# Patient Record
Sex: Male | Born: 1950 | Race: Black or African American | Hispanic: No | Smoking: Former smoker
Health system: Southern US, Community
[De-identification: ages and names within clinical notes are randomized; demographics above are authoritative.]

## PROBLEM LIST (undated history)

## (undated) DIAGNOSIS — G709 Myoneural disorder, unspecified: Secondary | ICD-10-CM

## (undated) DIAGNOSIS — Z86718 Personal history of other venous thrombosis and embolism: Secondary | ICD-10-CM

## (undated) DIAGNOSIS — F329 Major depressive disorder, single episode, unspecified: Secondary | ICD-10-CM

## (undated) DIAGNOSIS — D649 Anemia, unspecified: Secondary | ICD-10-CM

## (undated) DIAGNOSIS — F32A Depression, unspecified: Secondary | ICD-10-CM

## (undated) DIAGNOSIS — I509 Heart failure, unspecified: Secondary | ICD-10-CM

## (undated) DIAGNOSIS — M109 Gout, unspecified: Secondary | ICD-10-CM

## (undated) DIAGNOSIS — M1990 Unspecified osteoarthritis, unspecified site: Secondary | ICD-10-CM

## (undated) DIAGNOSIS — E785 Hyperlipidemia, unspecified: Secondary | ICD-10-CM

## (undated) DIAGNOSIS — I739 Peripheral vascular disease, unspecified: Secondary | ICD-10-CM

## (undated) DIAGNOSIS — IMO0001 Reserved for inherently not codable concepts without codable children: Secondary | ICD-10-CM

## (undated) DIAGNOSIS — T8859XA Other complications of anesthesia, initial encounter: Secondary | ICD-10-CM

## (undated) DIAGNOSIS — T4145XA Adverse effect of unspecified anesthetic, initial encounter: Secondary | ICD-10-CM

## (undated) DIAGNOSIS — N189 Chronic kidney disease, unspecified: Secondary | ICD-10-CM

## (undated) DIAGNOSIS — G8929 Other chronic pain: Secondary | ICD-10-CM

## (undated) DIAGNOSIS — Z8619 Personal history of other infectious and parasitic diseases: Secondary | ICD-10-CM

## (undated) DIAGNOSIS — I8390 Asymptomatic varicose veins of unspecified lower extremity: Secondary | ICD-10-CM

## (undated) DIAGNOSIS — Z87898 Personal history of other specified conditions: Secondary | ICD-10-CM

## (undated) DIAGNOSIS — I10 Essential (primary) hypertension: Secondary | ICD-10-CM

## (undated) HISTORY — DX: Asymptomatic varicose veins of unspecified lower extremity: I83.90

## (undated) HISTORY — PX: VARICOSE VEIN SURGERY: SHX832

## (undated) HISTORY — DX: Essential (primary) hypertension: I10

## (undated) HISTORY — PX: HEMORRHOID SURGERY: SHX153

## (undated) HISTORY — DX: Hyperlipidemia, unspecified: E78.5

## (undated) HISTORY — DX: Major depressive disorder, single episode, unspecified: F32.9

## (undated) HISTORY — DX: Personal history of other specified conditions: Z87.898

## (undated) HISTORY — PX: VASCULAR SURGERY: SHX849

## (undated) HISTORY — DX: Depression, unspecified: F32.A

## (undated) HISTORY — DX: Personal history of other infectious and parasitic diseases: Z86.19

## (undated) HISTORY — DX: Personal history of other venous thrombosis and embolism: Z86.718

---

## 2008-06-05 LAB — HM COLONOSCOPY

## 2011-06-26 DIAGNOSIS — M5126 Other intervertebral disc displacement, lumbar region: Secondary | ICD-10-CM | POA: Diagnosis not present

## 2011-06-26 DIAGNOSIS — M1A00X0 Idiopathic chronic gout, unspecified site, without tophus (tophi): Secondary | ICD-10-CM | POA: Diagnosis not present

## 2011-06-26 DIAGNOSIS — M545 Low back pain: Secondary | ICD-10-CM | POA: Diagnosis not present

## 2011-06-26 DIAGNOSIS — IMO0002 Reserved for concepts with insufficient information to code with codable children: Secondary | ICD-10-CM | POA: Diagnosis not present

## 2011-07-04 DIAGNOSIS — S99929A Unspecified injury of unspecified foot, initial encounter: Secondary | ICD-10-CM | POA: Diagnosis not present

## 2011-07-04 DIAGNOSIS — S8990XA Unspecified injury of unspecified lower leg, initial encounter: Secondary | ICD-10-CM | POA: Diagnosis not present

## 2011-07-04 DIAGNOSIS — IMO0002 Reserved for concepts with insufficient information to code with codable children: Secondary | ICD-10-CM | POA: Diagnosis not present

## 2011-07-04 DIAGNOSIS — W010XXA Fall on same level from slipping, tripping and stumbling without subsequent striking against object, initial encounter: Secondary | ICD-10-CM | POA: Diagnosis not present

## 2011-07-26 DIAGNOSIS — M25579 Pain in unspecified ankle and joints of unspecified foot: Secondary | ICD-10-CM | POA: Diagnosis not present

## 2011-07-26 DIAGNOSIS — M545 Low back pain: Secondary | ICD-10-CM | POA: Diagnosis not present

## 2011-07-26 DIAGNOSIS — IMO0002 Reserved for concepts with insufficient information to code with codable children: Secondary | ICD-10-CM | POA: Diagnosis not present

## 2011-07-26 DIAGNOSIS — M5126 Other intervertebral disc displacement, lumbar region: Secondary | ICD-10-CM | POA: Diagnosis not present

## 2011-08-23 DIAGNOSIS — M545 Low back pain: Secondary | ICD-10-CM | POA: Diagnosis not present

## 2011-08-23 DIAGNOSIS — IMO0002 Reserved for concepts with insufficient information to code with codable children: Secondary | ICD-10-CM | POA: Diagnosis not present

## 2011-08-23 DIAGNOSIS — M25579 Pain in unspecified ankle and joints of unspecified foot: Secondary | ICD-10-CM | POA: Diagnosis not present

## 2011-08-23 DIAGNOSIS — M5126 Other intervertebral disc displacement, lumbar region: Secondary | ICD-10-CM | POA: Diagnosis not present

## 2011-09-22 DIAGNOSIS — M25579 Pain in unspecified ankle and joints of unspecified foot: Secondary | ICD-10-CM | POA: Diagnosis not present

## 2011-09-22 DIAGNOSIS — M545 Low back pain: Secondary | ICD-10-CM | POA: Diagnosis not present

## 2011-09-22 DIAGNOSIS — M5126 Other intervertebral disc displacement, lumbar region: Secondary | ICD-10-CM | POA: Diagnosis not present

## 2011-09-22 DIAGNOSIS — IMO0002 Reserved for concepts with insufficient information to code with codable children: Secondary | ICD-10-CM | POA: Diagnosis not present

## 2011-10-06 ENCOUNTER — Encounter (HOSPITAL_BASED_OUTPATIENT_CLINIC_OR_DEPARTMENT_OTHER): Payer: Medicare (Managed Care) | Attending: General Surgery

## 2011-10-06 ENCOUNTER — Other Ambulatory Visit (HOSPITAL_BASED_OUTPATIENT_CLINIC_OR_DEPARTMENT_OTHER): Payer: Self-pay | Admitting: General Surgery

## 2011-10-06 ENCOUNTER — Ambulatory Visit (HOSPITAL_COMMUNITY)
Admission: RE | Admit: 2011-10-06 | Discharge: 2011-10-06 | Disposition: A | Discharge status: Home / Self Care | Payer: PRIVATE HEALTH INSURANCE | Source: Ambulatory Visit | Attending: General Surgery | Admitting: General Surgery

## 2011-10-06 DIAGNOSIS — L97309 Non-pressure chronic ulcer of unspecified ankle with unspecified severity: Secondary | ICD-10-CM | POA: Insufficient documentation

## 2011-10-06 DIAGNOSIS — M79673 Pain in unspecified foot: Secondary | ICD-10-CM

## 2011-10-06 DIAGNOSIS — E1169 Type 2 diabetes mellitus with other specified complication: Secondary | ICD-10-CM | POA: Insufficient documentation

## 2011-10-06 DIAGNOSIS — M7989 Other specified soft tissue disorders: Secondary | ICD-10-CM | POA: Insufficient documentation

## 2011-10-06 DIAGNOSIS — L97509 Non-pressure chronic ulcer of other part of unspecified foot with unspecified severity: Secondary | ICD-10-CM | POA: Insufficient documentation

## 2011-10-06 DIAGNOSIS — M25579 Pain in unspecified ankle and joints of unspecified foot: Secondary | ICD-10-CM

## 2011-10-06 DIAGNOSIS — Z79899 Other long term (current) drug therapy: Secondary | ICD-10-CM | POA: Insufficient documentation

## 2011-10-06 DIAGNOSIS — E785 Hyperlipidemia, unspecified: Secondary | ICD-10-CM | POA: Insufficient documentation

## 2011-10-06 DIAGNOSIS — Z86718 Personal history of other venous thrombosis and embolism: Secondary | ICD-10-CM | POA: Insufficient documentation

## 2011-10-18 DIAGNOSIS — E1142 Type 2 diabetes mellitus with diabetic polyneuropathy: Secondary | ICD-10-CM | POA: Diagnosis not present

## 2011-10-18 DIAGNOSIS — M25579 Pain in unspecified ankle and joints of unspecified foot: Secondary | ICD-10-CM | POA: Diagnosis not present

## 2011-10-18 DIAGNOSIS — M5126 Other intervertebral disc displacement, lumbar region: Secondary | ICD-10-CM | POA: Diagnosis not present

## 2011-10-18 DIAGNOSIS — M1A00X0 Idiopathic chronic gout, unspecified site, without tophus (tophi): Secondary | ICD-10-CM | POA: Diagnosis not present

## 2011-11-01 LAB — GLUCOSE, CAPILLARY
Glucose-Capillary: 112 mg/dL — ABNORMAL HIGH (ref 70–99)
Glucose-Capillary: 134 mg/dL — ABNORMAL HIGH (ref 70–99)
Glucose-Capillary: 113 mg/dL — ABNORMAL HIGH (ref 70–99)
Glucose-Capillary: 118 mg/dL — ABNORMAL HIGH (ref 70–99)
Glucose-Capillary: 119 mg/dL — ABNORMAL HIGH (ref 70–99)
Glucose-Capillary: 120 mg/dL — ABNORMAL HIGH (ref 70–99)
Glucose-Capillary: 123 mg/dL — ABNORMAL HIGH (ref 70–99)

## 2011-11-02 LAB — GLUCOSE, CAPILLARY: Glucose-Capillary: 95 mg/dL (ref 70–99)

## 2011-11-06 ENCOUNTER — Encounter (HOSPITAL_BASED_OUTPATIENT_CLINIC_OR_DEPARTMENT_OTHER): Payer: Medicare (Managed Care) | Attending: General Surgery

## 2011-11-06 DIAGNOSIS — E1169 Type 2 diabetes mellitus with other specified complication: Secondary | ICD-10-CM | POA: Insufficient documentation

## 2011-11-06 DIAGNOSIS — L97509 Non-pressure chronic ulcer of other part of unspecified foot with unspecified severity: Secondary | ICD-10-CM | POA: Insufficient documentation

## 2011-11-06 DIAGNOSIS — L97309 Non-pressure chronic ulcer of unspecified ankle with unspecified severity: Secondary | ICD-10-CM | POA: Insufficient documentation

## 2011-11-06 DIAGNOSIS — I872 Venous insufficiency (chronic) (peripheral): Secondary | ICD-10-CM | POA: Insufficient documentation

## 2011-11-06 LAB — GLUCOSE, CAPILLARY: Glucose-Capillary: 133 mg/dL — ABNORMAL HIGH (ref 70–99)

## 2011-11-07 ENCOUNTER — Ambulatory Visit (INDEPENDENT_AMBULATORY_CARE_PROVIDER_SITE_OTHER): Payer: PRIVATE HEALTH INSURANCE | Admitting: Vascular Surgery

## 2011-11-07 DIAGNOSIS — I739 Peripheral vascular disease, unspecified: Secondary | ICD-10-CM

## 2011-11-07 DIAGNOSIS — L97909 Non-pressure chronic ulcer of unspecified part of unspecified lower leg with unspecified severity: Secondary | ICD-10-CM

## 2011-11-07 LAB — GLUCOSE, CAPILLARY: Glucose-Capillary: 145 mg/dL — ABNORMAL HIGH (ref 70–99)

## 2011-11-09 LAB — GLUCOSE, CAPILLARY
Glucose-Capillary: 126 mg/dL — ABNORMAL HIGH (ref 70–99)
Glucose-Capillary: 126 mg/dL — ABNORMAL HIGH (ref 70–99)

## 2011-11-10 ENCOUNTER — Encounter (HOSPITAL_BASED_OUTPATIENT_CLINIC_OR_DEPARTMENT_OTHER): Payer: PRIVATE HEALTH INSURANCE

## 2011-11-10 LAB — GLUCOSE, CAPILLARY
Glucose-Capillary: 138 mg/dL — ABNORMAL HIGH (ref 70–99)
Glucose-Capillary: 125 mg/dL — ABNORMAL HIGH (ref 70–99)

## 2011-11-14 LAB — GLUCOSE, CAPILLARY: Glucose-Capillary: 156 mg/dL — ABNORMAL HIGH (ref 70–99)

## 2011-11-15 ENCOUNTER — Encounter: Payer: Self-pay | Admitting: Vascular Surgery

## 2011-11-16 ENCOUNTER — Ambulatory Visit (INDEPENDENT_AMBULATORY_CARE_PROVIDER_SITE_OTHER): Payer: PRIVATE HEALTH INSURANCE | Admitting: Vascular Surgery

## 2011-11-16 ENCOUNTER — Encounter: Payer: Self-pay | Admitting: Vascular Surgery

## 2011-11-16 ENCOUNTER — Encounter (HOSPITAL_COMMUNITY): Payer: Self-pay | Admitting: Pharmacy Technician

## 2011-11-16 ENCOUNTER — Other Ambulatory Visit: Payer: Self-pay

## 2011-11-16 VITALS — BP 138/81 | HR 59 | Resp 18 | Ht 73.0 in | Wt 220.0 lb

## 2011-11-16 DIAGNOSIS — I999 Unspecified disorder of circulatory system: Secondary | ICD-10-CM

## 2011-11-16 DIAGNOSIS — I998 Other disorder of circulatory system: Secondary | ICD-10-CM

## 2011-11-16 MED ORDER — SODIUM CHLORIDE 0.9 % IV SOLN: INTRAVENOUS | Status: DC

## 2011-11-16 NOTE — Progress Notes (Signed)
The patient presents today for evaluation of a lower surety arterial insufficiency. He is a 61-year-old black male with a several month history of ulcerations over the right foot over his lateral ankle and lateral foot. He has been seen at the wound center and has been treated with hyperbaric treatment. He has severe pain and has not had any improvement of this. We are seeing him today for further evaluation. 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 The study was from 11/07/2011. The patient does have a long history of claudication type symptoms on the right. His past history is also significant for prior venous disease. He has scars and skin changes to suggest a prior Linton procedure on his left leg. He does have severe venous stasis changes.  Past Medical History  Diagnosis Date  . Vein, varicose   . Hypertension   . Hyperlipidemia   . Varicose veins   . Diabetes mellitus     History  Substance Use Topics  . Smoking status: Former Smoker -- 35 years    Types: Cigarettes    Quit date: 11/12/2011  . Smokeless tobacco: Never Used  . Alcohol Use: Yes    Family History  Problem Relation Age of Onset  . Cancer Mother   . Hyperlipidemia Father   . Hypertension Father   . Diabetes Father     No Known Allergies  Current outpatient prescriptions:cephALEXin (KEFLEX) 500 MG capsule, Take 500 mg by mouth 4 (four) times daily., Disp: , Rfl: ;  colchicine 0.6 MG tablet, Take 0.6 mg by mouth daily., Disp: , Rfl: ;  gabapentin (NEURONTIN) 800 MG tablet, Take 800 mg by mouth 2 (two) times daily with a meal., Disp: , Rfl: ;  lisinopril (PRINIVIL,ZESTRIL) 20 MG tablet, Take 20 mg by mouth daily., Disp: , Rfl:  metFORMIN (GLUCOPHAGE) 500 MG tablet, Take 500 mg by mouth 2 (two) times daily  with a meal., Disp: , Rfl: ;  oxyCODONE (OXYCONTIN) 10 MG 12 hr tablet, Take 10 mg by mouth every 12 (twelve) hours., Disp: , Rfl: ;  pravastatin (PRAVACHOL) 20 MG tablet, Take 20 mg by mouth daily., Disp: , Rfl: ;  pregabalin (LYRICA) 300 MG capsule, Take 300 mg by mouth 2 (two) times daily., Disp: , Rfl:   BP 138/81  Pulse 59  Resp 18  Ht 6' 1" (1.854 m)  Wt 220 lb (99.791 kg)  BMI 29.03 kg/m2  Body mass index is 29.03 kg/(m^2).       Review of systems positive for dizziness and the skin ulcer or rashes, history of venous varicosities otherwise negative except as in his past medical  Physical exam well-developed well-nourished black male in no acute distress. HEENT is normal. Heart regular rate and rhythm without murmur. Chest clear bilaterally without rales rhonchi or wheezes. Abdomen soft nontender no masses no aneurysm palpable Neurologically he is grossly intact Pulse status: Carotid arteries without bruits, 2+ radial and 2+ femoral pulses bilaterally. 2+ left dorsalis pedis pulse. Absent right popliteal and distal pulses. 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 I discussed this at length with the patient. I do not feel he is   adequate flow for healing. I have recommended that we proceed with arteriography for further evaluation. I explained by physical exam in all likelihood he would have superficial femoral occlusive disease and a may benefit from consideration of femoral-popliteal bypass. He understands that this is a limb threatening situation. We have scheduled his arteriography for tomorrow 614 at [facility]  

## 2011-11-17 ENCOUNTER — Ambulatory Visit (HOSPITAL_COMMUNITY)
Admission: RE | Admit: 2011-11-17 | Discharge: 2011-11-17 | Disposition: A | Discharge status: Home / Self Care | Payer: Medicare (Managed Care) | Source: Ambulatory Visit | Attending: Vascular Surgery | Admitting: Vascular Surgery

## 2011-11-17 ENCOUNTER — Encounter (HOSPITAL_COMMUNITY)
Admission: RE | Disposition: A | Discharge status: Home / Self Care | Payer: Self-pay | Source: Ambulatory Visit | Attending: Vascular Surgery

## 2011-11-17 ENCOUNTER — Other Ambulatory Visit: Payer: Self-pay

## 2011-11-17 DIAGNOSIS — I739 Peripheral vascular disease, unspecified: Secondary | ICD-10-CM

## 2011-11-17 DIAGNOSIS — E785 Hyperlipidemia, unspecified: Secondary | ICD-10-CM | POA: Insufficient documentation

## 2011-11-17 DIAGNOSIS — L98499 Non-pressure chronic ulcer of skin of other sites with unspecified severity: Secondary | ICD-10-CM

## 2011-11-17 DIAGNOSIS — I10 Essential (primary) hypertension: Secondary | ICD-10-CM | POA: Insufficient documentation

## 2011-11-17 DIAGNOSIS — E119 Type 2 diabetes mellitus without complications: Secondary | ICD-10-CM | POA: Insufficient documentation

## 2011-11-17 HISTORY — PX: ABDOMINAL AORTAGRAM: SHX5454

## 2011-11-17 LAB — POCT I-STAT, CHEM 8
BUN: 19 mg/dL (ref 6–23)
Creatinine, Ser: 1 mg/dL (ref 0.50–1.35)
Glucose, Bld: 95 mg/dL (ref 70–99)
Potassium: 4.8 mEq/L (ref 3.5–5.1)
Sodium: 143 mEq/L (ref 135–145)
TCO2: 29 mmol/L (ref 0–100)

## 2011-11-17 LAB — GLUCOSE, CAPILLARY
Glucose-Capillary: 67 mg/dL — ABNORMAL LOW (ref 70–99)
Glucose-Capillary: 70 mg/dL (ref 70–99)

## 2011-11-17 SURGERY — ABDOMINAL AORTAGRAM
Anesthesia: LOCAL

## 2011-11-17 MED ORDER — LABETALOL HCL 5 MG/ML IV SOLN: 10.0000 mg | INTRAVENOUS | Status: DC | PRN

## 2011-11-17 MED ORDER — ONDANSETRON HCL 4 MG/2ML IJ SOLN: 4.0000 mg | Freq: Four times a day (QID) | INTRAMUSCULAR | Status: DC | PRN

## 2011-11-17 MED ORDER — OXYCODONE-ACETAMINOPHEN 5-325 MG PO TABS
ORAL_TABLET | ORAL | Status: AC
  Filled 2011-11-17: qty 2

## 2011-11-17 MED ORDER — LIDOCAINE HCL (PF) 1 % IJ SOLN
INTRAMUSCULAR | Status: AC
  Filled 2011-11-17: qty 30

## 2011-11-17 MED ORDER — SODIUM CHLORIDE 0.9 % IV SOLN: 1.0000 mL/kg/h | INTRAVENOUS | Status: DC

## 2011-11-17 MED ORDER — MAGNESIUM SULFATE 40 MG/ML IJ SOLN: 2.0000 g | Freq: Once | INTRAMUSCULAR | Status: DC | PRN

## 2011-11-17 MED ORDER — OXYCODONE-ACETAMINOPHEN 5-325 MG PO TABS
ORAL_TABLET | ORAL | Status: AC
  Administered 2011-11-17: 2 via ORAL
  Filled 2011-11-17: qty 2

## 2011-11-17 MED ORDER — HEPARIN (PORCINE) IN NACL 2-0.9 UNIT/ML-% IJ SOLN
INTRAMUSCULAR | Status: AC
  Filled 2011-11-17: qty 1000

## 2011-11-17 MED ORDER — OXYCODONE-ACETAMINOPHEN 5-325 MG PO TABS
1.0000 | ORAL_TABLET | ORAL | Status: DC | PRN
  Administered 2011-11-17 (×3): 2 via ORAL
  Filled 2011-11-17: qty 2

## 2011-11-17 MED ORDER — MIDAZOLAM HCL 2 MG/2ML IJ SOLN
INTRAMUSCULAR | Status: AC
  Filled 2011-11-17: qty 2

## 2011-11-17 MED ORDER — FENTANYL CITRATE 0.05 MG/ML IJ SOLN
INTRAMUSCULAR | Status: AC
  Filled 2011-11-17: qty 2

## 2011-11-17 MED ORDER — HYDRALAZINE HCL 20 MG/ML IJ SOLN
INTRAMUSCULAR | Status: AC
  Filled 2011-11-17: qty 1

## 2011-11-17 MED ORDER — OXYCODONE-ACETAMINOPHEN 5-325 MG PO TABS: 1.0000 | ORAL_TABLET | ORAL | Status: DC | PRN

## 2011-11-17 MED ORDER — HYDRALAZINE HCL 20 MG/ML IJ SOLN
10.0000 mg | INTRAMUSCULAR | Status: DC | PRN
  Administered 2011-11-17: 10 mg via INTRAVENOUS

## 2011-11-17 NOTE — Progress Notes (Signed)
IV team unable to get access.   Dr. Arbie Cookey notified.  IV team to attempt with U/S.

## 2011-11-17 NOTE — Progress Notes (Signed)
Unable to obtain IV access.  IV team called.  Pt states he has no ride home and nobody to stay with him tonight.  States there is nobody he can call so he plans to drive home.  Explained that he cannot drive and he had to have a driver.  Pt states there is not anyone he could call.  Explained to pt that I needed to call Dr. Arbie Cookey and let him know.  Pt states "ok".  Dr. Arbie Cookey notified and came over to speak with pt and reschedule his procedure for when he had a driver and someone to stay with him.  Pt states "Oh that's not a problem, I can get my sister to pick me up."  When Dr. Arbie Cookey left the room, pt states he cannot call her now because she was at work.  Dr. Arbie Cookey aware.

## 2011-11-17 NOTE — H&P (View-Only) (Signed)
The patient presents today for evaluation of a lower surety arterial insufficiency. He is a 61-year-old black male with a several month history of ulcerations over the right foot over his lateral ankle and lateral foot. He has been seen at the wound center and has been treated with hyperbaric treatment. He has severe pain and has not had any improvement of this. We are seeing him today for further evaluation. 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 The study was from 11/07/2011. The patient does have a long history of claudication type symptoms on the right. His past history is also significant for prior venous disease. He has scars and skin changes to suggest a prior Linton procedure on his left leg. He does have severe venous stasis changes.  Past Medical History  Diagnosis Date  . Vein, varicose   . Hypertension   . Hyperlipidemia   . Varicose veins   . Diabetes mellitus     History  Substance Use Topics  . Smoking status: Former Smoker -- 35 years    Types: Cigarettes    Quit date: 11/12/2011  . Smokeless tobacco: Never Used  . Alcohol Use: Yes    Family History  Problem Relation Age of Onset  . Cancer Mother   . Hyperlipidemia Father   . Hypertension Father   . Diabetes Father     No Known Allergies  Current outpatient prescriptions:cephALEXin (KEFLEX) 500 MG capsule, Take 500 mg by mouth 4 (four) times daily., Disp: , Rfl: ;  colchicine 0.6 MG tablet, Take 0.6 mg by mouth daily., Disp: , Rfl: ;  gabapentin (NEURONTIN) 800 MG tablet, Take 800 mg by mouth 2 (two) times daily with a meal., Disp: , Rfl: ;  lisinopril (PRINIVIL,ZESTRIL) 20 MG tablet, Take 20 mg by mouth daily., Disp: , Rfl:  metFORMIN (GLUCOPHAGE) 500 MG tablet, Take 500 mg by mouth 2 (two) times daily  with a meal., Disp: , Rfl: ;  oxyCODONE (OXYCONTIN) 10 MG 12 hr tablet, Take 10 mg by mouth every 12 (twelve) hours., Disp: , Rfl: ;  pravastatin (PRAVACHOL) 20 MG tablet, Take 20 mg by mouth daily., Disp: , Rfl: ;  pregabalin (LYRICA) 300 MG capsule, Take 300 mg by mouth 2 (two) times daily., Disp: , Rfl:   BP 138/81  Pulse 59  Resp 18  Ht 6' 1" (1.854 m)  Wt 220 lb (99.791 kg)  BMI 29.03 kg/m2  Body mass index is 29.03 kg/(m^2).       Review of systems positive for dizziness and the skin ulcer or rashes, history of venous varicosities otherwise negative except as in his past medical  Physical exam well-developed well-nourished black male in no acute distress. HEENT is normal. Heart regular rate and rhythm without murmur. Chest clear bilaterally without rales rhonchi or wheezes. Abdomen soft nontender no masses no aneurysm palpable Neurologically he is grossly intact Pulse status: Carotid arteries without bruits, 2+ radial and 2+ femoral pulses bilaterally. 2+ left dorsalis pedis pulse. Absent right popliteal and distal pulses. 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 I discussed this at length with the patient. I do not feel he is   adequate flow for healing. I have recommended that we proceed with arteriography for further evaluation. I explained by physical exam in all likelihood he would have superficial femoral occlusive disease and a may benefit from consideration of femoral-popliteal bypass. He understands that this is a limb threatening situation. We have scheduled his arteriography for tomorrow 614 at [facility]  

## 2011-11-17 NOTE — Progress Notes (Signed)
Instructed pt to call his sister and ask her to be here at 2100 for D/C teaching.  Pt states it wasn't his sister he was calling but his daughter.  Pt states he called her already and she will be here at 2100.

## 2011-11-17 NOTE — Progress Notes (Signed)
No family present.  PT given our phone to use to call driver.  Pt states they are outside waiting. Pt  taken outside in wheelchair.  Family had just gotten to parking lot.  Family member asked pt why  had waited so late to call them and he stated "I was just going to drive myself but she had to see somebody".  We then waited for Valet parking to bring his car around.  Pt was put into passenger seat.  Another family member was driving the other car home.

## 2011-11-17 NOTE — Discharge Instructions (Signed)
Peripheral Vascular Disease Peripheral Vascular Disease (PVD), also called Peripheral Arterial Disease (PAD), is a circulation problem caused by cholesterol (atherosclerotic plaque) deposits in the arteries. PVD commonly occurs in the lower extremities (legs) but it can occur in other areas of the body, such as your arms. The cholesterol buildup in the arteries reduces blood flow which can cause pain and other serious problems. The presence of PVD can place a person at risk for Coronary Artery Disease (CAD).  CAUSES  Causes of PVD can be many. It is usually associated with more than one risk factor such as:   High Cholesterol.   Smoking.   Diabetes.   Lack of exercise or inactivity.   High blood pressure (hypertension).   Obesity.   Family history.  SYMPTOMS   When the lower extremities are affected, patients with PVD may experience:   Leg pain with exertion or physical activity. This is called INTERMITTENT CLAUDICATION. This may present as cramping or numbness with physical activity. The location of the pain is associated with the level of blockage. For example, blockage at the abdominal level (distal abdominal aorta) may result in buttock or hip pain. Lower leg arterial blockage may result in calf pain.   As PVD becomes more severe, pain can develop with less physical activity.   In people with severe PVD, leg pain may occur at rest.   Other PVD signs and symptoms:   Leg numbness or weakness.   Coldness in the affected leg or foot, especially when compared to the other leg.   A change in leg color.   Patients with significant PVD are more prone to ulcers or sores on toes, feet or legs. These may take longer to heal or may reoccur. The ulcers or sores can become infected.   If signs and symptoms of PVD are ignored, gangrene may occur. This can result in the loss of toes or loss of an entire limb.   Not all leg pain is related to PVD. Other medical conditions can cause leg  pain such as:   Blood clots (embolism) or Deep Vein Thrombosis.   Inflammation of the blood vessels (vasculitis).   Spinal stenosis.  DIAGNOSIS  Diagnosis of PVD can involve several different types of tests. These can include:  Pulse Volume Recording Method (PVR). This test is simple, painless and does not involve the use of X-rays. PVR involves measuring and comparing the blood pressure in the arms and legs. An ABI (Ankle-Brachial Index) is calculated. The normal ratio of blood pressures is 1. As this number becomes smaller, it indicates more severe disease.   < 0.95 - indicates significant narrowing in one or more leg vessels.   <0.8 - there will usually be pain in the foot, leg or buttock with exercise.   <0.4 - will usually have pain in the legs at rest.   <0.25 - usually indicates limb threatening PVD.   Doppler detection of pulses in the legs. This test is painless and checks to see if you have a pulses in your legs/feet.   A dye or contrast material (a substance that highlights the blood vessels so they show up on x-ray) may be given to help your caregiver better see the arteries for the following tests. The dye is eliminated from your body by the kidney's. Your caregiver may order blood work to check your kidney function and other laboratory values before the following tests are performed:   Magnetic Resonance Angiography (MRA). An MRA is a picture   study of the blood vessels and arteries. The MRA machine uses a large magnet to produce images of the blood vessels.   Computed Tomography Angiography (CTA). A CTA is a specialized x-ray that looks at how the blood flows in your blood vessels. An IV may be inserted into your arm so contrast dye can be injected.   Angiogram. Is a procedure that uses x-rays to look at your blood vessels. This procedure is minimally invasive, meaning a small incision (cut) is made in your groin. A small tube (catheter) is then inserted into the artery of  your groin. The catheter is guided to the blood vessel or artery your caregiver wants to examine. Contrast dye is injected into the catheter. X-rays are then taken of the blood vessel or artery. After the images are obtained, the catheter is taken out.  TREATMENT  Treatment of PVD involves many interventions which may include:  Lifestyle changes:   Quitting smoking.   Exercise.   Groin Site Care Refer to this sheet in the next few weeks. These instructions provide you with information on caring for yourself after your procedure. Your caregiver may also give you more specific instructions. Your treatment has been planned according to current medical practices, but problems sometimes occur. Call your caregiver if you have any problems or questions after your procedure. HOME CARE INSTRUCTIONS  You may shower 24 hours after the procedure. Remove the bandage (dressing) and gently wash the site with plain soap and water. Gently pat the site dry.   Do not apply powder or lotion to the site.   Do not sit in a bathtub, swimming pool, or whirlpool for 5 to 7 days.   No bending, squatting, or lifting anything over 10 pounds (4.5 kg) as directed by your caregiver.   Inspect the site at least twice daily.   Do not drive home if you are discharged the same day of the procedure. Have someone else drive you.   You may drive 24 hours after the procedure unless otherwise instructed by your caregiver.  What to expect:  Any bruising will usually fade within 1 to 2 weeks.   Blood that collects in the tissue (hematoma) may be painful to the touch. It should usually decrease in size and tenderness within 1 to 2 weeks.  SEEK IMMEDIATE MEDICAL CARE IF:  You have unusual pain at the groin site or down the affected leg.   You have redness, warmth, swelling, or pain at the groin site.   You have drainage (other than a small amount of blood on the dressing).   You have chills.   You have a fever or  persistent symptoms for more than 72 hours.   You have a fever and your symptoms suddenly get worse.   Your leg becomes pale, cool, tingly, or numb.   You have heavy bleeding from the site. Hold pressure on the site.  Document Released: 06/24/2010 Document Revised: 05/11/2011 Document Reviewed: 06/24/2010 Exit[employer] Patient Information 2012 [employer].  Following a low fat, low cholesterol diet.   Control of diabetes.   Foot care is very important to the PVD patient. Good foot care can help prevent infection.   Medication:   Cholesterol-lowering medicine.   Blood pressure medicine.   Anti-platelet drugs.   Certain medicines may reduce symptoms of Intermittent Claudication.   Interventional/Surgical options:   Angioplasty. An Angioplasty is a procedure that inflates a balloon in the blocked artery. This opens the blocked artery to improve blood   flow.   Stent Implant. A wire mesh tube (stent) is placed in the artery. The stent expands and stays in place, allowing the artery to remain open.   Peripheral Bypass Surgery. This is a surgical procedure that reroutes the blood around a blocked artery to help improve blood flow. This type of procedure may be performed if Angioplasty or stent implants are not an option.  SEEK IMMEDIATE MEDICAL CARE IF:   You develop pain or numbness in your arms or legs.   Your arm or leg turns cold, becomes blue in color.   You develop redness, warmth, swelling and pain in your arms or legs.  MAKE SURE YOU:   Understand these instructions.   Will watch your condition.   Will get help right away if you are not doing well or get worse.  Document Released: 06/29/2004 Document Revised: 05/11/2011 Document Reviewed: 05/26/2008 ExitCare Patient Information 2012 ExitCare, LLCGroin Site Care Refer to this sheet in the next few weeks. These instructions provide you with information on caring for yourself after your procedure. Your caregiver may  also give you more specific instructions. Your treatment has been planned according to current medical practices, but problems sometimes occur. Call your caregiver if you have any problems or questions after your procedure. HOME CARE INSTRUCTIONS  You may shower 24 hours after the procedure. Remove the bandage (dressing) and gently wash the site with plain soap and water. Gently pat the site dry.   Do not apply powder or lotion to the site.   Do not sit in a bathtub, swimming pool, or whirlpool for 5 to 7 days.   No bending, squatting, or lifting anything over 10 pounds (4.5 kg) as directed by your caregiver.   Inspect the site at least twice daily.   Do not drive home if you are discharged the same day of the procedure. Have someone else drive you.   You may drive 24 hours after the procedure unless otherwise instructed by your caregiver.  What to expect:  Any bruising will usually fade within 1 to 2 weeks.   Blood that collects in the tissue (hematoma) may be painful to the touch. It should usually decrease in size and tenderness within 1 to 2 weeks.  SEEK IMMEDIATE MEDICAL CARE IF:  You have unusual pain at the groin site or down the affected leg.   You have redness, warmth, swelling, or pain at the groin site.   You have drainage (other than a small amount of blood on the dressing).   You have chills.   You have a fever or persistent symptoms for more than 72 hours.   You have a fever and your symptoms suddenly get worse.   Your leg becomes pale, cool, tingly, or numb.   You have heavy bleeding from the site. Hold pressure on the site.  Document Released: 06/24/2010 Document Revised: 05/11/2011 Document Reviewed: 06/24/2010 ExitCare Patient Information 2012 [employer] 

## 2011-11-17 NOTE — Interval H&P Note (Signed)
History and Physical Interval Note:  11/17/2011 10:30 AM  [name] [name]  has presented today for surgery, with the diagnosis of right foot ischimez  The various methods of treatment have been discussed with the patient and family. After consideration of risks, benefits and other options for treatment, the patient has consented to  Procedure(s) (LRB): ABDOMINAL AORTAGRAM (N/A) as a surgical intervention .  The patients' history has been reviewed, patient examined, no change in status, stable for surgery.  I have reviewed the patients' chart and labs.  Questions were answered to the patient's satisfaction.     Melvin Marmo

## 2011-11-17 NOTE — Progress Notes (Signed)
No driver present for pt.  Asked pt to call her and see if she knew where to come.  Pt made a phone call and know states it is his daughter in law.

## 2011-11-17 NOTE — Progress Notes (Signed)
IV team still unable to get IV access.  Dr. Arbie Cookey notifed

## 2011-11-17 NOTE — Op Note (Signed)
OPERATIVE REPORT  DATE OF SURGERY: 11/17/2011  PATIENT: [name] [name], 61 y.o. male MRN: [id number]  DOB: 12-10-1950  PRE-OPERATIVE DIAGNOSIS: Right foot ischemia with tissue loss  POST-OPERATIVE DIAGNOSIS:  Same  PROCEDURE: Right femoral vein access for IV medication, right femoral artery access for arteriography with aortobifemoral runoff  SURGEON:  Gretta Began, M.D.    ANESTHESIA:  1% lidocaine with IV sedation  EBL: Minimal ml     BLOOD ADMINISTERED: None  DRAINS: None  SPECIMEN: None  COUNTS CORRECT:  YES  PLAN OF CARE: Holding area   PATIENT DISPOSITION:  PACU - hemodynamically stable  PROCEDURE DETAILS: The patient is very difficult time with IV access and IV therapy was unsuccessful multiple attempts at providing peripheral vascular IV access. For this reason his side to place a femoral sheath. The patient was taken to the peripheral pressure cath lab and using ultrasound the right and left groins were imaged. The left common femoral vein appeared to be small with [place] for possible postphlebitic type changes. The right common femoral vein was easily accessed with an 18-gauge needle and a guidewire and 5 French sheath was passed. The patient was then given IV sedation. Next the right common femoral artery was accessed again using ultrasound visualization. A guidewire facet to the level of the aorta and a 5 French sheath was passed over the guidewire. The pigtail catheter was positioned at the level of the renal arteries and an AP projection was undertaken. His were widely patent renal arteries bilaterally and no evidence of aortoiliac occlusive disease. The pigtail cath was then withdrawn below the level renal arteries and runoff was obtained. This revealed that the left common superficial femoral and popliteal veins were all patent. The patient had widely patent 3 vessel runoff on the left. On the right proximal superficial femoral artery was patent although narrowed to  approximately mid thigh and then there was a total occlusion with reconstitution of the above-knee popliteal artery. There was three-vessel runoff of the posterior tibial being the dominant runoff vessel. The pigtail catheter was removed over a guidewire and the standard she will was undertaken.  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 11/17/2011 3:27 PM

## 2011-11-20 ENCOUNTER — Telehealth: Payer: Self-pay | Admitting: *Deleted

## 2011-11-20 NOTE — Telephone Encounter (Signed)
Called [name] [name] to verify that he knew to come to Short Stay tomorrow for anesthesia preop. He said he couldn't come tomorrow and needed a different day. Okey Regal told me to tell him to call Short Stay at [phone] to make this change in preop appt. I called him back and had to leave a message with the above info.

## 2011-11-21 ENCOUNTER — Other Ambulatory Visit (HOSPITAL_COMMUNITY): Payer: PRIVATE HEALTH INSURANCE

## 2011-11-21 NOTE — Pre-Procedure Instructions (Signed)
20 [name] [name]  11/21/2011   Your procedure is scheduled on:  Friday, June 21st.  Report to [facility] at 5:30 AM.  Call this number if you have problems the morning of surgery: [phone]   Remember:   Do not eat food or drink any liquid:After Midnight.      Take these medicines the morning of surgery with A SIP OF WATER: Doxycycline (Vibra- Tabs).  May take Colchicine and Oxycodone if needed.   Do not wear jewelry, make-up or nail polish.  Do not wear lotions, powders, or perfumes. You may wear deodorant.  Do not shave 48 hours prior to surgery. Men may shave face and neck.  Do not bring valuables to the hospital.  Contacts, dentures or bridgework may not be worn into surgery.  Leave suitcase in the car. After surgery it may be brought to your room.  For patients admitted to the hospital, checkout time is 11:00 AM the day of discharge.   Patients discharged the day of surgery will not be allowed to drive home.  Name and phone number of your driver: NA  Special Instructions: CHG Shower Use Special Wash: 1/2 bottle night before surgery and 1/2 bottle morning of surgery.   Please read over the following fact sheets that you were given: Pain Booklet, Coughing and Deep Breathing, Blood Transfusion Information, MRSA Information and Surgical Site Infection Prevention

## 2011-11-22 ENCOUNTER — Ambulatory Visit (HOSPITAL_COMMUNITY)
Admission: RE | Admit: 2011-11-22 | Discharge: 2011-11-22 | Disposition: A | Discharge status: Home / Self Care | Payer: Medicare (Managed Care) | Source: Ambulatory Visit | Attending: Anesthesiology | Admitting: Anesthesiology

## 2011-11-22 ENCOUNTER — Encounter (HOSPITAL_COMMUNITY)
Admission: RE | Admit: 2011-11-22 | Discharge: 2011-11-22 | Disposition: A | Discharge status: Home / Self Care | Payer: Medicare (Managed Care) | Source: Ambulatory Visit | Attending: Vascular Surgery | Admitting: Vascular Surgery

## 2011-11-22 ENCOUNTER — Encounter (HOSPITAL_COMMUNITY): Payer: Self-pay

## 2011-11-22 DIAGNOSIS — Z01812 Encounter for preprocedural laboratory examination: Secondary | ICD-10-CM | POA: Insufficient documentation

## 2011-11-22 DIAGNOSIS — Z01818 Encounter for other preprocedural examination: Secondary | ICD-10-CM | POA: Insufficient documentation

## 2011-11-22 HISTORY — DX: Adverse effect of unspecified anesthetic, initial encounter: T41.45XA

## 2011-11-22 HISTORY — DX: Other complications of anesthesia, initial encounter: T88.59XA

## 2011-11-22 HISTORY — DX: Chronic kidney disease, unspecified: N18.9

## 2011-11-22 HISTORY — DX: Peripheral vascular disease, unspecified: I73.9

## 2011-11-22 HISTORY — DX: Reserved for inherently not codable concepts without codable children: IMO0001

## 2011-11-22 HISTORY — DX: Myoneural disorder, unspecified: G70.9

## 2011-11-22 HISTORY — DX: Anemia, unspecified: D64.9

## 2011-11-22 LAB — SURGICAL PCR SCREEN
MRSA, PCR: NEGATIVE
Staphylococcus aureus: NEGATIVE

## 2011-11-22 LAB — CBC
HCT: 35.3 % — ABNORMAL LOW (ref 39.0–52.0)
MCV: 99.2 fL (ref 78.0–100.0)

## 2011-11-22 LAB — COMPREHENSIVE METABOLIC PANEL
Alkaline Phosphatase: 67 U/L (ref 39–117)
BUN: 21 mg/dL (ref 6–23)
CO2: 22 mEq/L (ref 19–32)
Chloride: 103 mEq/L (ref 96–112)
Creatinine, Ser: 0.94 mg/dL (ref 0.50–1.35)
GFR calc non Af Amer: 89 mL/min — ABNORMAL LOW (ref >90)
Total Bilirubin: 0.2 mg/dL — ABNORMAL LOW (ref 0.3–1.2)

## 2011-11-22 LAB — TYPE AND SCREEN: Antibody Screen: NEGATIVE

## 2011-11-22 LAB — URINALYSIS, ROUTINE W REFLEX MICROSCOPIC
Bilirubin Urine: NEGATIVE
Hgb urine dipstick: NEGATIVE
Ketones, ur: NEGATIVE mg/dL
Specific Gravity, Urine: 1.021 (ref 1.005–1.030)
pH: 5.5 (ref 5.0–8.0)

## 2011-11-22 LAB — APTT: aPTT: 33 seconds (ref 24–37)

## 2011-11-22 LAB — ABO/RH: ABO/RH(D): O POS

## 2011-11-22 LAB — PROTIME-INR: INR: 1.01 (ref 0.00–1.49)

## 2011-11-22 NOTE — Progress Notes (Signed)
Primary Physician - just moved from [place] does not have physician in [place] Does not have a heart doctor Ekg performed last week in epic Has not had a echo or stress test.

## 2011-11-23 MED ORDER — SODIUM CHLORIDE 0.9 % IV SOLN: INTRAVENOUS | Status: DC

## 2011-11-23 MED ORDER — DEXTROSE 5 % IV SOLN
1.5000 g | INTRAVENOUS | Status: AC
  Administered 2011-11-24: 1.5 g via INTRAVENOUS
  Filled 2011-11-23: qty 1.5

## 2011-11-24 ENCOUNTER — Encounter (HOSPITAL_COMMUNITY)
Admission: RE | Disposition: A | Discharge status: Home Health | Payer: Self-pay | Source: Ambulatory Visit | Attending: Vascular Surgery

## 2011-11-24 ENCOUNTER — Encounter (HOSPITAL_COMMUNITY): Payer: Self-pay | Admitting: Certified Registered Nurse Anesthetist

## 2011-11-24 ENCOUNTER — Ambulatory Visit (HOSPITAL_COMMUNITY): Payer: Medicare (Managed Care) | Admitting: Certified Registered Nurse Anesthetist

## 2011-11-24 ENCOUNTER — Inpatient Hospital Stay (HOSPITAL_COMMUNITY)
Admission: RE | Admit: 2011-11-24 | Discharge: 2011-11-29 | DRG: 254 | Disposition: A | Discharge status: Home Health | Payer: Medicare (Managed Care) | Source: Ambulatory Visit | Attending: Vascular Surgery | Admitting: Vascular Surgery

## 2011-11-24 ENCOUNTER — Encounter (HOSPITAL_COMMUNITY): Payer: Self-pay | Admitting: *Deleted

## 2011-11-24 ENCOUNTER — Inpatient Hospital Stay (HOSPITAL_COMMUNITY): Payer: Medicare (Managed Care)

## 2011-11-24 DIAGNOSIS — E119 Type 2 diabetes mellitus without complications: Secondary | ICD-10-CM | POA: Diagnosis present

## 2011-11-24 DIAGNOSIS — L97309 Non-pressure chronic ulcer of unspecified ankle with unspecified severity: Secondary | ICD-10-CM | POA: Diagnosis present

## 2011-11-24 DIAGNOSIS — M129 Arthropathy, unspecified: Secondary | ICD-10-CM | POA: Diagnosis present

## 2011-11-24 DIAGNOSIS — Z01812 Encounter for preprocedural laboratory examination: Secondary | ICD-10-CM

## 2011-11-24 DIAGNOSIS — I8390 Asymptomatic varicose veins of unspecified lower extremity: Secondary | ICD-10-CM | POA: Diagnosis present

## 2011-11-24 DIAGNOSIS — Z79899 Other long term (current) drug therapy: Secondary | ICD-10-CM

## 2011-11-24 DIAGNOSIS — L98499 Non-pressure chronic ulcer of skin of other sites with unspecified severity: Principal | ICD-10-CM | POA: Diagnosis present

## 2011-11-24 DIAGNOSIS — I959 Hypotension, unspecified: Secondary | ICD-10-CM | POA: Diagnosis not present

## 2011-11-24 DIAGNOSIS — I10 Essential (primary) hypertension: Secondary | ICD-10-CM | POA: Diagnosis present

## 2011-11-24 DIAGNOSIS — I70219 Atherosclerosis of native arteries of extremities with intermittent claudication, unspecified extremity: Secondary | ICD-10-CM

## 2011-11-24 DIAGNOSIS — I739 Peripheral vascular disease, unspecified: Principal | ICD-10-CM | POA: Diagnosis present

## 2011-11-24 DIAGNOSIS — Z01818 Encounter for other preprocedural examination: Secondary | ICD-10-CM

## 2011-11-24 DIAGNOSIS — L97509 Non-pressure chronic ulcer of other part of unspecified foot with unspecified severity: Secondary | ICD-10-CM | POA: Diagnosis present

## 2011-11-24 DIAGNOSIS — E785 Hyperlipidemia, unspecified: Secondary | ICD-10-CM | POA: Diagnosis present

## 2011-11-24 DIAGNOSIS — Z87891 Personal history of nicotine dependence: Secondary | ICD-10-CM

## 2011-11-24 HISTORY — PX: FEMORAL-POPLITEAL BYPASS GRAFT: SHX937

## 2011-11-24 LAB — GLUCOSE, CAPILLARY
Glucose-Capillary: 87 mg/dL (ref 70–99)
Glucose-Capillary: 93 mg/dL (ref 70–99)
Glucose-Capillary: 96 mg/dL (ref 70–99)
Glucose-Capillary: 93 mg/dL (ref 70–99)
Glucose-Capillary: 92 mg/dL (ref 70–99)

## 2011-11-24 LAB — CBC
HCT: 33 % — ABNORMAL LOW (ref 39.0–52.0)
Hemoglobin: 11.1 g/dL — ABNORMAL LOW (ref 13.0–17.0)
MCH: 33.5 pg (ref 26.0–34.0)
MCHC: 33.6 g/dL (ref 30.0–36.0)
RDW: 13.6 % (ref 11.5–15.5)

## 2011-11-24 SURGERY — BYPASS GRAFT FEMORAL-POPLITEAL ARTERY
Anesthesia: General | Site: Leg Upper | Laterality: Right | Wound class: Clean

## 2011-11-24 MED ORDER — 0.9 % SODIUM CHLORIDE (POUR BTL) OPTIME
TOPICAL | Status: DC | PRN
  Administered 2011-11-24: 1000 mL

## 2011-11-24 MED ORDER — CEPHALEXIN 500 MG PO CAPS
1000.0000 mg | ORAL_CAPSULE | Freq: Two times a day (BID) | ORAL | Status: DC
  Administered 2011-11-25 – 2011-11-28 (×7): 1000 mg via ORAL
  Filled 2011-11-24 (×9): qty 2

## 2011-11-24 MED ORDER — OXYCODONE HCL 5 MG PO TABS
20.0000 mg | ORAL_TABLET | Freq: Four times a day (QID) | ORAL | Status: DC
  Administered 2011-11-24 – 2011-11-28 (×16): 20 mg via ORAL
  Filled 2011-11-24 (×21): qty 4

## 2011-11-24 MED ORDER — PHENOL 1.4 % MT LIQD: 1.0000 | OROMUCOSAL | Status: DC | PRN

## 2011-11-24 MED ORDER — GLYCOPYRROLATE 0.2 MG/ML IJ SOLN
INTRAMUSCULAR | Status: DC | PRN
  Administered 2011-11-24: .6 mg via INTRAVENOUS

## 2011-11-24 MED ORDER — LISINOPRIL 20 MG PO TABS
20.0000 mg | ORAL_TABLET | Freq: Every day | ORAL | Status: DC
  Administered 2011-11-24 – 2011-11-25 (×2): 20 mg via ORAL
  Filled 2011-11-24 (×3): qty 1

## 2011-11-24 MED ORDER — ROCURONIUM BROMIDE 100 MG/10ML IV SOLN
INTRAVENOUS | Status: DC | PRN
  Administered 2011-11-24: 50 mg via INTRAVENOUS

## 2011-11-24 MED ORDER — SIMVASTATIN 5 MG PO TABS
5.0000 mg | ORAL_TABLET | Freq: Every day | ORAL | Status: DC
  Administered 2011-11-24 – 2011-11-28 (×5): 5 mg via ORAL
  Filled 2011-11-24 (×6): qty 1

## 2011-11-24 MED ORDER — PANTOPRAZOLE SODIUM 40 MG PO TBEC
40.0000 mg | DELAYED_RELEASE_TABLET | Freq: Every day | ORAL | Status: DC
  Administered 2011-11-24 – 2011-11-28 (×5): 40 mg via ORAL
  Filled 2011-11-24 (×5): qty 1

## 2011-11-24 MED ORDER — LABETALOL HCL 5 MG/ML IV SOLN
10.0000 mg | INTRAVENOUS | Status: DC | PRN
  Filled 2011-11-24: qty 4

## 2011-11-24 MED ORDER — LACTATED RINGERS IV SOLN
INTRAVENOUS | Status: DC | PRN
  Administered 2011-11-24 (×2): via INTRAVENOUS

## 2011-11-24 MED ORDER — MORPHINE SULFATE 2 MG/ML IJ SOLN
2.0000 mg | INTRAMUSCULAR | Status: DC | PRN
  Administered 2011-11-24: 4 mg via INTRAVENOUS
  Administered 2011-11-24: 2 mg via INTRAVENOUS
  Administered 2011-11-25 (×7): 4 mg via INTRAVENOUS
  Administered 2011-11-25: 2 mg via INTRAVENOUS
  Administered 2011-11-26 (×2): 4 mg via INTRAVENOUS
  Administered 2011-11-26: 2 mg via INTRAVENOUS
  Administered 2011-11-27: 4 mg via INTRAVENOUS
  Filled 2011-11-24 (×3): qty 2
  Filled 2011-11-24 (×2): qty 1
  Filled 2011-11-24 (×8): qty 2

## 2011-11-24 MED ORDER — LISINOPRIL-HYDROCHLOROTHIAZIDE 20-12.5 MG PO TABS: 1.0000 | ORAL_TABLET | Freq: Every day | ORAL | Status: DC

## 2011-11-24 MED ORDER — GUAIFENESIN-DM 100-10 MG/5ML PO SYRP: 15.0000 mL | ORAL_SOLUTION | ORAL | Status: DC | PRN

## 2011-11-24 MED ORDER — ALUM & MAG HYDROXIDE-SIMETH 200-200-20 MG/5ML PO SUSP: 15.0000 mL | ORAL | Status: DC | PRN

## 2011-11-24 MED ORDER — ENOXAPARIN SODIUM 30 MG/0.3ML ~~LOC~~ SOLN
30.0000 mg | SUBCUTANEOUS | Status: DC
  Administered 2011-11-25 – 2011-11-27 (×3): 30 mg via SUBCUTANEOUS
  Filled 2011-11-24 (×5): qty 0.3

## 2011-11-24 MED ORDER — DOXYCYCLINE HYCLATE 100 MG PO TABS
100.0000 mg | ORAL_TABLET | Freq: Two times a day (BID) | ORAL | Status: DC
  Administered 2011-11-24 – 2011-11-28 (×9): 100 mg via ORAL
  Filled 2011-11-24 (×11): qty 1

## 2011-11-24 MED ORDER — COLLAGENASE 250 UNIT/GM EX OINT
1.0000 "application " | TOPICAL_OINTMENT | Freq: Two times a day (BID) | CUTANEOUS | Status: DC
  Administered 2011-11-26 – 2011-11-28 (×6): 1 via TOPICAL
  Filled 2011-11-24: qty 30

## 2011-11-24 MED ORDER — HYDROMORPHONE HCL PF 1 MG/ML IJ SOLN
0.2500 mg | INTRAMUSCULAR | Status: DC | PRN
  Administered 2011-11-24 (×2): 0.5 mg via INTRAVENOUS

## 2011-11-24 MED ORDER — FERROUS SULFATE 325 (65 FE) MG PO TABS
325.0000 mg | ORAL_TABLET | Freq: Every day | ORAL | Status: DC
  Administered 2011-11-24 – 2011-11-29 (×6): 325 mg via ORAL
  Filled 2011-11-24 (×8): qty 1

## 2011-11-24 MED ORDER — MIDAZOLAM HCL 5 MG/5ML IJ SOLN
INTRAMUSCULAR | Status: DC | PRN
  Administered 2011-11-24: 2 mg via INTRAVENOUS

## 2011-11-24 MED ORDER — ONDANSETRON HCL 4 MG/2ML IJ SOLN: 4.0000 mg | Freq: Four times a day (QID) | INTRAMUSCULAR | Status: DC | PRN

## 2011-11-24 MED ORDER — DEXTROSE 5 % IV SOLN
1.5000 g | Freq: Two times a day (BID) | INTRAVENOUS | Status: AC
  Administered 2011-11-24 – 2011-11-25 (×2): 1.5 g via INTRAVENOUS
  Filled 2011-11-24 (×2): qty 1.5

## 2011-11-24 MED ORDER — PROPOFOL 10 MG/ML IV BOLUS
INTRAVENOUS | Status: DC | PRN
  Administered 2011-11-24: 170 mg via INTRAVENOUS

## 2011-11-24 MED ORDER — ONDANSETRON HCL 4 MG/2ML IJ SOLN: 4.0000 mg | Freq: Once | INTRAMUSCULAR | Status: DC | PRN

## 2011-11-24 MED ORDER — INSULIN ASPART 100 UNIT/ML ~~LOC~~ SOLN
0.0000 [IU] | Freq: Three times a day (TID) | SUBCUTANEOUS | Status: DC
  Administered 2011-11-25: 2 [IU] via SUBCUTANEOUS

## 2011-11-24 MED ORDER — HYDRALAZINE HCL 20 MG/ML IJ SOLN
10.0000 mg | INTRAMUSCULAR | Status: DC | PRN
  Filled 2011-11-24: qty 0.5

## 2011-11-24 MED ORDER — LABETALOL HCL 5 MG/ML IV SOLN
INTRAVENOUS | Status: DC | PRN
  Administered 2011-11-24: 5 mg via INTRAVENOUS
  Administered 2011-11-24: 2.5 mg via INTRAVENOUS

## 2011-11-24 MED ORDER — SODIUM CHLORIDE 0.9 % IV SOLN: 500.0000 mL | Freq: Once | INTRAVENOUS | Status: AC | PRN

## 2011-11-24 MED ORDER — FENTANYL CITRATE 0.05 MG/ML IJ SOLN
INTRAMUSCULAR | Status: DC | PRN
  Administered 2011-11-24: 50 ug via INTRAVENOUS
  Administered 2011-11-24: 100 ug via INTRAVENOUS
  Administered 2011-11-24 (×4): 50 ug via INTRAVENOUS
  Administered 2011-11-24: 150 ug via INTRAVENOUS

## 2011-11-24 MED ORDER — HEPARIN SODIUM (PORCINE) 1000 UNIT/ML IJ SOLN
INTRAMUSCULAR | Status: DC | PRN
  Administered 2011-11-24: 9 mL via INTRAVENOUS

## 2011-11-24 MED ORDER — ACETAMINOPHEN 325 MG PO TABS: 325.0000 mg | ORAL_TABLET | ORAL | Status: DC | PRN

## 2011-11-24 MED ORDER — DOCUSATE SODIUM 100 MG PO CAPS
100.0000 mg | ORAL_CAPSULE | Freq: Every day | ORAL | Status: DC
  Administered 2011-11-25 – 2011-11-28 (×4): 100 mg via ORAL
  Filled 2011-11-24 (×6): qty 1

## 2011-11-24 MED ORDER — IRON 325 (65 FE) MG PO TABS: 1.0000 | ORAL_TABLET | Freq: Every day | ORAL | Status: DC

## 2011-11-24 MED ORDER — MORPHINE SULFATE 2 MG/ML IJ SOLN
INTRAMUSCULAR | Status: AC
  Administered 2011-11-24: 2 mg via INTRAVENOUS
  Filled 2011-11-24: qty 1

## 2011-11-24 MED ORDER — NEOSTIGMINE METHYLSULFATE 1 MG/ML IJ SOLN
INTRAMUSCULAR | Status: DC | PRN
  Administered 2011-11-24: 4 mg via INTRAVENOUS

## 2011-11-24 MED ORDER — GABAPENTIN 400 MG PO CAPS
800.0000 mg | ORAL_CAPSULE | Freq: Two times a day (BID) | ORAL | Status: DC
  Administered 2011-11-24 – 2011-11-29 (×10): 800 mg via ORAL
  Filled 2011-11-24 (×12): qty 2

## 2011-11-24 MED ORDER — POTASSIUM CHLORIDE CRYS ER 20 MEQ PO TBCR: 20.0000 meq | EXTENDED_RELEASE_TABLET | Freq: Once | ORAL | Status: AC | PRN

## 2011-11-24 MED ORDER — VECURONIUM BROMIDE 10 MG IV SOLR
INTRAVENOUS | Status: DC | PRN
  Administered 2011-11-24: 2 mg via INTRAVENOUS
  Administered 2011-11-24: 1 mg via INTRAVENOUS

## 2011-11-24 MED ORDER — ZOLPIDEM TARTRATE 5 MG PO TABS
5.0000 mg | ORAL_TABLET | Freq: Every evening | ORAL | Status: DC | PRN
  Administered 2011-11-28 (×2): 5 mg via ORAL
  Filled 2011-11-24 (×2): qty 1

## 2011-11-24 MED ORDER — PROTAMINE SULFATE 10 MG/ML IV SOLN
INTRAVENOUS | Status: DC | PRN
  Administered 2011-11-24 (×5): 10 mg via INTRAVENOUS

## 2011-11-24 MED ORDER — COLCHICINE 0.6 MG PO TABS
0.6000 mg | ORAL_TABLET | Freq: Every day | ORAL | Status: DC
  Administered 2011-11-24 – 2011-11-28 (×5): 0.6 mg via ORAL
  Filled 2011-11-24 (×6): qty 1

## 2011-11-24 MED ORDER — WHITE PETROLATUM GEL
Status: AC
  Administered 2011-11-24: 0.2
  Filled 2011-11-24: qty 5

## 2011-11-24 MED ORDER — ONDANSETRON HCL 4 MG/2ML IJ SOLN
INTRAMUSCULAR | Status: DC | PRN
  Administered 2011-11-24: 4 mg via INTRAVENOUS

## 2011-11-24 MED ORDER — LIDOCAINE HCL (CARDIAC) 20 MG/ML IV SOLN
INTRAVENOUS | Status: DC | PRN
  Administered 2011-11-24: 100 mg via INTRAVENOUS

## 2011-11-24 MED ORDER — INDOMETHACIN 25 MG PO CAPS
25.0000 mg | ORAL_CAPSULE | Freq: Two times a day (BID) | ORAL | Status: DC
  Administered 2011-11-24 – 2011-11-29 (×10): 25 mg via ORAL
  Filled 2011-11-24 (×12): qty 1

## 2011-11-24 MED ORDER — SODIUM CHLORIDE 0.9 % IV SOLN
10.0000 mg | INTRAVENOUS | Status: DC | PRN
  Administered 2011-11-24: 40 ug/min via INTRAVENOUS

## 2011-11-24 MED ORDER — SODIUM CHLORIDE 0.9 % IJ SOLN
INTRAMUSCULAR | Status: AC
  Administered 2011-11-24: 16:00:00
  Filled 2011-11-24: qty 20

## 2011-11-24 MED ORDER — METFORMIN HCL 500 MG PO TABS
500.0000 mg | ORAL_TABLET | Freq: Two times a day (BID) | ORAL | Status: DC
  Administered 2011-11-25 – 2011-11-28 (×8): 500 mg via ORAL
  Filled 2011-11-24 (×12): qty 1

## 2011-11-24 MED ORDER — ACETAMINOPHEN 650 MG RE SUPP: 325.0000 mg | RECTAL | Status: DC | PRN

## 2011-11-24 MED ORDER — DOPAMINE-DEXTROSE 3.2-5 MG/ML-% IV SOLN: 3.0000 ug/kg/min | INTRAVENOUS | Status: DC

## 2011-11-24 MED ORDER — POTASSIUM CHLORIDE IN NACL 20-0.9 MEQ/L-% IV SOLN
INTRAVENOUS | Status: DC
  Administered 2011-11-24: 22:00:00 via INTRAVENOUS
  Filled 2011-11-24 (×4): qty 1000

## 2011-11-24 MED ORDER — HYDROCHLOROTHIAZIDE 12.5 MG PO CAPS
12.5000 mg | ORAL_CAPSULE | Freq: Every day | ORAL | Status: DC
  Administered 2011-11-24 – 2011-11-25 (×2): 12.5 mg via ORAL
  Filled 2011-11-24 (×3): qty 1

## 2011-11-24 MED ORDER — VITAMIN B-12 1000 MCG PO TABS
1000.0000 ug | ORAL_TABLET | Freq: Every day | ORAL | Status: DC
  Administered 2011-11-24 – 2011-11-28 (×5): 1000 ug via ORAL
  Filled 2011-11-24 (×6): qty 1

## 2011-11-24 MED ORDER — SODIUM CHLORIDE 0.9 % IR SOLN
Status: DC | PRN
  Administered 2011-11-24: 10:00:00

## 2011-11-24 MED ORDER — HYDROMORPHONE HCL PF 1 MG/ML IJ SOLN
INTRAMUSCULAR | Status: AC
  Administered 2011-11-24: 0.5 mg
  Filled 2011-11-24: qty 2

## 2011-11-24 MED ORDER — METOPROLOL TARTRATE 1 MG/ML IV SOLN: 2.0000 mg | INTRAVENOUS | Status: DC | PRN

## 2011-11-24 SURGICAL SUPPLY — 48 items
BANDAGE ESMARK 6X9 LF (GAUZE/BANDAGES/DRESSINGS) IMPLANT
BENZOIN TINCTURE PRP APPL 2/3 (GAUZE/BANDAGES/DRESSINGS) ×2 IMPLANT
BNDG ESMARK 6X9 LF (GAUZE/BANDAGES/DRESSINGS)
CANISTER SUCTION 2500CC (MISCELLANEOUS) ×2 IMPLANT
CANNULA VESSEL W/WING WO/VALVE (CANNULA) ×2 IMPLANT
CLIP LIGATING EXTRA MED SLVR (CLIP) ×2 IMPLANT
CLIP LIGATING EXTRA SM BLUE (MISCELLANEOUS) ×2 IMPLANT
CLOTH BEACON ORANGE TIMEOUT ST (SAFETY) ×2 IMPLANT
COVER SURGICAL LIGHT HANDLE (MISCELLANEOUS) ×4 IMPLANT
CUFF TOURNIQUET SINGLE 34IN LL (TOURNIQUET CUFF) IMPLANT
CUFF TOURNIQUET SINGLE 44IN (TOURNIQUET CUFF) IMPLANT
DRAIN SNY 10X20 3/4 PERF (WOUND CARE) IMPLANT
DRAPE WARM FLUID 44X44 (DRAPE) ×2 IMPLANT
DRAPE X-RAY CASS 24X20 (DRAPES) IMPLANT
DRSG COVADERM 4X10 (GAUZE/BANDAGES/DRESSINGS) IMPLANT
DRSG COVADERM 4X8 (GAUZE/BANDAGES/DRESSINGS) IMPLANT
ELECT REM PT RETURN 9FT ADLT (ELECTROSURGICAL) ×2
ELECTRODE REM PT RTRN 9FT ADLT (ELECTROSURGICAL) ×1 IMPLANT
EVACUATOR SILICONE 100CC (DRAIN) IMPLANT
GLOVE SS BIOGEL STRL SZ 7.5 (GLOVE) ×1 IMPLANT
GLOVE SUPERSENSE BIOGEL SZ 7.5 (GLOVE) ×1
GOWN STRL NON-REIN LRG LVL3 (GOWN DISPOSABLE) ×6 IMPLANT
INSERT FOGARTY SM (MISCELLANEOUS) IMPLANT
KIT BASIN OR (CUSTOM PROCEDURE TRAY) ×2 IMPLANT
KIT ROOM TURNOVER OR (KITS) ×2 IMPLANT
NS IRRIG 1000ML POUR BTL (IV SOLUTION) ×4 IMPLANT
PACK PERIPHERAL VASCULAR (CUSTOM PROCEDURE TRAY) ×2 IMPLANT
PAD ARMBOARD 7.5X6 YLW CONV (MISCELLANEOUS) ×4 IMPLANT
PADDING CAST COTTON 6X4 STRL (CAST SUPPLIES) IMPLANT
SET COLLECT BLD 21X3/4 12 (NEEDLE) IMPLANT
SPONGE GAUZE 4X4 12PLY (GAUZE/BANDAGES/DRESSINGS) ×2 IMPLANT
STAPLER VISISTAT 35W (STAPLE) IMPLANT
STOPCOCK 4 WAY LG BORE MALE ST (IV SETS) IMPLANT
STRIP CLOSURE SKIN 1/2X4 (GAUZE/BANDAGES/DRESSINGS) ×2 IMPLANT
SUT ETHILON 3 0 PS 1 (SUTURE) IMPLANT
SUT PROLENE 5 0 C 1 24 (SUTURE) ×2 IMPLANT
SUT PROLENE 6 0 CC (SUTURE) ×4 IMPLANT
SUT SILK 2 0 SH (SUTURE) ×2 IMPLANT
SUT VIC AB 2-0 CTX 36 (SUTURE) ×4 IMPLANT
SUT VIC AB 3-0 SH 27 (SUTURE) ×3
SUT VIC AB 3-0 SH 27X BRD (SUTURE) ×3 IMPLANT
SUT VICRYL 4-0 PS2 18IN ABS (SUTURE) ×4 IMPLANT
TOWEL OR 17X24 6PK STRL BLUE (TOWEL DISPOSABLE) ×4 IMPLANT
TOWEL OR 17X26 10 PK STRL BLUE (TOWEL DISPOSABLE) ×4 IMPLANT
TRAY FOLEY CATH 14FRSI W/METER (CATHETERS) ×2 IMPLANT
TUBING EXTENTION W/L.L. (IV SETS) IMPLANT
UNDERPAD 30X30 INCONTINENT (UNDERPADS AND DIAPERS) ×2 IMPLANT
WATER STERILE IRR 1000ML POUR (IV SOLUTION) ×2 IMPLANT

## 2011-11-24 NOTE — Progress Notes (Signed)
Utilization review completed.  

## 2011-11-24 NOTE — Preoperative (Signed)
Beta Blockers   Reason not to administer Beta Blockers:Not Applicable 

## 2011-11-24 NOTE — Anesthesia Procedure Notes (Signed)
Procedure Name: Intubation Date/Time: 11/24/2011 9:08 AM Performed by: Rogelia Boga Pre-anesthesia Checklist: Patient identified, Emergency Drugs available, Suction available, Patient being monitored and Timeout performed Patient Re-evaluated:Patient Re-evaluated prior to inductionOxygen Delivery Method: Circle system utilized Preoxygenation: Pre-oxygenation with 100% oxygen Intubation Type: IV induction Ventilation: Mask ventilation without difficulty and Oral airway inserted - appropriate to patient size Laryngoscope Size: Mac and 4 Grade View: Grade II Tube type: Oral Tube size: 7.5 mm Number of attempts: 1 Airway Equipment and Method: Stylet Placement Confirmation: ETT inserted through vocal cords under direct vision,  positive ETCO2 and breath sounds checked- equal and bilateral Secured at: 21 cm Tube secured with: Tape Dental Injury: Teeth and Oropharynx as per pre-operative assessment

## 2011-11-24 NOTE — Anesthesia Postprocedure Evaluation (Signed)
  Anesthesia Post-op Note  Patient: [name] [name]  Procedure(s) Performed: Procedure(s) (LRB): BYPASS GRAFT FEMORAL-POPLITEAL ARTERY (Right)  Patient Location: PACU  Anesthesia Type: General  Level of Consciousness: awake, alert  and oriented  Airway and Oxygen Therapy: Patient Spontanous Breathing and Patient connected to nasal cannula oxygen  Post-op Pain: mild  Post-op Assessment: Post-op Vital signs reviewed and Patient's Cardiovascular Status Stable  Post-op Vital Signs: stable  Complications: No apparent anesthesia complications

## 2011-11-24 NOTE — Interval H&P Note (Signed)
History and Physical Interval Note:  11/24/2011 8:20 AM  [name] [name]  has presented today for surgery, with the diagnosis of Peripheral Arterial Disease  The various methods of treatment have been discussed with the patient and family. After consideration of risks, benefits and other options for treatment, the patient has consented to  Procedure(s) (LRB): BYPASS GRAFT FEMORAL-POPLITEAL ARTERY (Right) as a surgical intervention .  The patient's history has been reviewed, patient examined, no change in status, stable for surgery.  I have reviewed the patients' chart and labs.  Questions were answered to the patient's satisfaction.     [name]

## 2011-11-24 NOTE — Transfer of Care (Signed)
Immediate Anesthesia Transfer of Care Note  Patient: [name] [name]  Procedure(s) Performed: Procedure(s) (LRB): BYPASS GRAFT FEMORAL-POPLITEAL ARTERY (Right)  Patient Location: PACU  Anesthesia Type: General  Level of Consciousness: awake, alert  and oriented  Airway & Oxygen Therapy: Patient Spontanous Breathing and Patient connected to nasal cannula oxygen  Post-op Assessment: Report given to PACU RN and Post -op Vital signs reviewed and stable  Post vital signs: Reviewed and stable  Complications: No apparent anesthesia complications

## 2011-11-24 NOTE — H&P (View-Only) (Signed)
The patient presents today for evaluation of a lower surety arterial insufficiency. He is a 61 year old black male with a several month history of ulcerations over the right foot over his lateral ankle and lateral foot. He has been seen at the wound center and has been treated with hyperbaric treatment. He has severe pain and has not had any improvement of this. We are seeing him today for further evaluation. 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 The study was from 11/07/2011. The patient does have a long history of claudication type symptoms on the right. His past history is also significant for prior venous disease. He has scars and skin changes to suggest a prior Linton procedure on his left leg. He does have severe venous stasis changes.  Past Medical History  Diagnosis Date  . Vein, varicose   . Hypertension   . Hyperlipidemia   . Varicose veins   . Diabetes mellitus     History  Substance Use Topics  . Smoking status: Former Smoker -- 35 years    Types: Cigarettes    Quit date: 11/12/2011  . Smokeless tobacco: Never Used  . Alcohol Use: Yes    Family History  Problem Relation Age of Onset  . Cancer Mother   . Hyperlipidemia Father   . Hypertension Father   . Diabetes Father     No Known Allergies  Current outpatient prescriptions:cephALEXin (KEFLEX) 500 MG capsule, Take 500 mg by mouth 4 (four) times daily., Disp: , Rfl: ;  colchicine 0.6 MG tablet, Take 0.6 mg by mouth daily., Disp: , Rfl: ;  gabapentin (NEURONTIN) 800 MG tablet, Take 800 mg by mouth 2 (two) times daily with a meal., Disp: , Rfl: ;  lisinopril (PRINIVIL,ZESTRIL) 20 MG tablet, Take 20 mg by mouth daily., Disp: , Rfl:  metFORMIN (GLUCOPHAGE) 500 MG tablet, Take 500 mg by mouth 2 (two) times daily  with a meal., Disp: , Rfl: ;  oxyCODONE (OXYCONTIN) 10 MG 12 hr tablet, Take 10 mg by mouth every 12 (twelve) hours., Disp: , Rfl: ;  pravastatin (PRAVACHOL) 20 MG tablet, Take 20 mg by mouth daily., Disp: , Rfl: ;  pregabalin (LYRICA) 300 MG capsule, Take 300 mg by mouth 2 (two) times daily., Disp: , Rfl:   BP 138/81  Pulse 59  Resp 18  Ht 6\' 1"  (1.854 m)  Wt 220 lb (99.791 kg)  BMI 29.03 kg/m2  Body mass index is 29.03 kg/(m^2).       Review of systems positive for dizziness and the skin ulcer or rashes, history of venous varicosities otherwise negative except as in his past medical  Physical exam well-developed well-nourished black male in no acute distress. HEENT is normal. Heart regular rate and rhythm without murmur. Chest clear bilaterally without rales rhonchi or wheezes. Abdomen soft nontender no masses no aneurysm palpable Neurologically he is grossly intact Pulse status: Carotid arteries without bruits, 2+ radial and 2+ femoral pulses bilaterally. 2+ left dorsalis pedis pulse. Absent right popliteal and distal pulses. 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 I discussed this at length with the patient. I do not feel he is  adequate flow for healing. I have recommended that we proceed with arteriography for further evaluation. I explained by physical exam in all likelihood he would have superficial femoral occlusive disease and a may benefit from consideration of femoral-popliteal bypass. He understands that this is a limb threatening situation. We have scheduled his arteriography for tomorrow 614 at [facility]

## 2011-11-24 NOTE — Anesthesia Preprocedure Evaluation (Addendum)
Anesthesia Evaluation  Patient identified by MRN, date of birth, ID band Patient awake    Reviewed: Allergy & Precautions, H&P , NPO status , Patient's Chart, lab work & pertinent test results  Airway Mallampati: II TM Distance: >3 FB Neck ROM: Full    Dental  (+) Dental Advisory Given and Poor Dentition   Pulmonary former smoker         Cardiovascular hypertension, Pt. on medications + Peripheral Vascular Disease     Neuro/Psych  Neuromuscular disease    GI/Hepatic   Endo/Other  Diabetes mellitus-, Well Controlled, Type 2  Renal/GU      Musculoskeletal  (+) Arthritis -,   Abdominal   Peds  Hematology   Anesthesia Other Findings   Reproductive/Obstetrics                          Anesthesia Physical Anesthesia Plan  ASA: III  Anesthesia Plan: General   Post-op Pain Management:    Induction: Intravenous  Airway Management Planned: Oral ETT  Additional Equipment: Arterial line, CVP and Ultrasound Guidance Line Placement  Intra-op Plan:   Post-operative Plan:   Informed Consent: I have reviewed the patients History and Physical, chart, labs and discussed the procedure including the risks, benefits and alternatives for the proposed anesthesia with the patient or authorized representative who has indicated his/her understanding and acceptance.   Dental advisory given  Plan Discussed with: CRNA, Anesthesiologist and Surgeon  Anesthesia Plan Comments:         Anesthesia Quick Evaluation

## 2011-11-24 NOTE — Op Note (Signed)
OPERATIVE REPORT  DATE OF SURGERY: 11/24/2011  PATIENT: [name] [name], 61 y.o. male MRN: [id number]  DOB: April 03, 1951  PRE-OPERATIVE DIAGNOSIS: Right foot ischemia with tissue loss  POST-OPERATIVE DIAGNOSIS:  Same  PROCEDURE: Right femoral to above-knee popliteal with translocated nonreversible saphenous vein  SURGEON:  Gretta Began, M.D.  PHYSICIAN ASSISTANT: Rhyne  ANESTHESIA:  Gen.  EBL: 100  ml  Total I/O In: 1000 [I.V.:1000] Out: 1800 [Urine:1700; Blood:100]  BLOOD ADMINISTERED: None  DRAINS: None  SPECIMEN: None  COUNTS CORRECT:  YES  PLAN OF CARE: PACU stable   PATIENT DISPOSITION:  PACU - hemodynamically stable  PROCEDURE DETAILS: The patient was taken up and placed supine position where the area of the right groin right leg were prepped and draped in usual sterile fashion. Ultrasound was used to visualize the saphenous vein. The patient had ranged saphenous vein. The posterior with the larger caliber. The common femoral artery was encircled with a vessel loop and was of good caliber with good pulse. The saphenous vein was unroofed down to the level of the knee through several skin bridges. Tributary branches were ligated with 30 and 4-0 silk ties and divided. The vein post far posteriorly and therefore the popliteal and this exposure was a separate incision not through the vein harvest sites. Separate incision was made to expose the above-knee popliteal artery. The artery was thickened but was adequate for anastomosis. The vein was ligated distally and proximally at the saphenofemoral junction. The vein was gently dilated and was of excellent caliber for bypass., With the level of the above-knee popliteal artery to the level of the groin. The patient was given 9000 units of intravenous heparin after adequate circulation time the femoral artery was occluded proximally and distally was opened 11 blade and symmetry Potts scissors. The vein was brought onto the field. The vein  was spatulated and not reversed. It was sewn end-to-side to the common femoral artery with a running 6-0 Prolene suture. Anastomosis tested and found to be adequate. Next the vein was brought through the prior created tunnel down to the level above knee popliteal artery. The above-knee popliteal artery was occluded proximal and distally and opened 11 blade and symmetry Potts scissors. The vein was cut to appropriate length was spatulated and sewn end-to-side to the artery with a running 6-0 Prolene suture. Prior to completion of the anastomosis the usual flushing maneuvers were undertaken. Anastomosis was completed and flow was assured to the foot. Graft and a Doppler flow was noted at the foot. The patient was given 50 mg of protamine to reverse the heparin. Wound irrigated with saline. Was closed with 2-0 Vicryl in several layers in the groin and popliteal incision. Skin harvest incision closed with 3-0 Vicryl the subcutaneous and subcutaneous tissue. A dressing was applied the patient was taken to the recovery stable condition.    Gretta Began, M.D. 11/24/2011 1:19 PM

## 2011-11-25 DIAGNOSIS — Z48812 Encounter for surgical aftercare following surgery on the circulatory system: Secondary | ICD-10-CM

## 2011-11-25 LAB — CBC
MCV: 99.7 fL (ref 78.0–100.0)
Platelets: 120 10*3/uL — ABNORMAL LOW (ref 150–400)
RDW: 13.7 % (ref 11.5–15.5)
WBC: 6.3 10*3/uL (ref 4.0–10.5)

## 2011-11-25 LAB — HEMOGLOBIN A1C: Mean Plasma Glucose: 111 mg/dL (ref <117)

## 2011-11-25 LAB — GLUCOSE, CAPILLARY
Glucose-Capillary: 88 mg/dL (ref 70–99)
Glucose-Capillary: 124 mg/dL — ABNORMAL HIGH (ref 70–99)

## 2011-11-25 LAB — BASIC METABOLIC PANEL
Calcium: 9 mg/dL (ref 8.4–10.5)
Creatinine, Ser: 0.91 mg/dL (ref 0.50–1.35)
GFR calc Af Amer: >90 mL/min (ref >90)

## 2011-11-25 MED ORDER — SODIUM CHLORIDE 0.9 % IJ SOLN
10.0000 mL | INTRAMUSCULAR | Status: DC | PRN
  Administered 2011-11-25: 10 mL
  Administered 2011-11-26: 20 mL
  Administered 2011-11-26 – 2011-11-28 (×3): 10 mL

## 2011-11-25 NOTE — Progress Notes (Signed)
11/25/2011 12:32 PM Nursing note Upon arrival to floor, noted pt. Had belonging bag with medications in it. Educated patient on hospital policy and offered to lock medications downstairs, patient refused. Asked pt. If he had a family member that could pick up medications and pt. Refused that as well. Safety education provided to patient. Charge nurse made aware as well. Will continue to Closely monitor patient.  Jillayne Witte, Blanchard Kelch

## 2011-11-25 NOTE — Progress Notes (Signed)
11/25/2011 6:15 PM Nursing note PT BP has been low this afternoon ranging from 91/50 to 87/55. Pt. States he feels fine, still c/o pain 7/10. Dr. Myra Gianotti paged and made aware. Orders received ok to administer gabapentin and indocin. Orders received to hold oxy IR. Orders enacted. Pt. Updated on plan of care. Pt. Verbalized understanding.  Will continue to monitor patient closely.  Leigh Kaeding, Blanchard Kelch

## 2011-11-25 NOTE — Evaluation (Signed)
Physical Therapy Evaluation Patient Details Name: [name] [name] MRN: [id number] DOB: 1951-04-12 Today's Date: 11/25/2011 Time: 6213-0865 PT Time Calculation (min): 35 min  PT Assessment / Plan / Recommendation Clinical Impression  Pt s/p R femoral-popliteal bypass graft. Pts mobility limited due to pain. Pt with decreased sensation, strength and functional mobility. Pt will benefit from skilled PT in the acute care setting in order to maximize functional mobility and strength prior to d/c home    PT Assessment  Patient needs continued PT services    Follow Up Recommendations  Home health PT;Supervision/Assistance - 24 hour    Barriers to Discharge        lEquipment Recommendations  None recommended by PT    Recommendations for Other Services     Frequency Min 4X/week    Precautions / Restrictions Restrictions Weight Bearing Restrictions: No         Mobility  Bed Mobility Bed Mobility: Sit to Supine Sit to Supine: 4: Min assist Details for Bed Mobility Assistance: Min assist with weight of RLE. Pt able to control rest of body into supine.  Transfers Transfers: Sit to Stand;Stand to Sit;Stand Pivot Transfers Sit to Stand: 1: +2 Total assist Sit to Stand: Patient Percentage: 60% Stand to Sit: 3: Mod assist Stand Pivot Transfers: 3: Mod assist Details for Transfer Assistance: VC for proper sequencing and safety as well as hand placements. Assist to maintain stability throughout transfer. Pt able to take steps with minimal weightbearing through RLE, majority of support on RW. Ambulation/Gait Ambulation/Gait Assistance: Not tested (comment)    Exercises     PT Diagnosis: Difficulty walking;Acute pain  PT Problem List: Decreased strength;Decreased activity tolerance;Decreased mobility;Decreased knowledge of use of DME;Decreased safety awareness;Decreased knowledge of precautions;Pain PT Treatment Interventions: DME instruction;Gait training;Stair training;Functional  mobility training;Therapeutic activities;Therapeutic exercise;Patient/family education   PT Goals Acute Rehab PT Goals PT Goal Formulation: With patient Time For Goal Achievement: 12/02/11 Potential to Achieve Goals: Fair Pt will go Supine/Side to Sit: with modified independence PT Goal: Supine/Side to Sit - Progress: Goal set today Pt will go Sit to Supine/Side: with modified independence PT Goal: Sit to Supine/Side - Progress: Goal set today Pt will go Sit to Stand: with supervision PT Goal: Sit to Stand - Progress: Goal set today Pt will go Stand to Sit: with supervision PT Goal: Stand to Sit - Progress: Goal set today Pt will Transfer Bed to Chair/Chair to Bed: with supervision PT Transfer Goal: Bed to Chair/Chair to Bed - Progress: Goal set today Pt will Ambulate: 16 - 50 feet;with supervision;with least restrictive assistive device PT Goal: Ambulate - Progress: Goal set today Pt will Go Up / Down Stairs: 3-5 stairs;with rail(s);with supervision PT Goal: Up/Down Stairs - Progress: Goal set today  Visit Information  Last PT Received On: 11/25/11 Assistance Needed: +1 PT/OT Co-Evaluation/Treatment: Yes    Subjective Data  Subjective: "I'm jacked up" Patient Stated Goal: to be able to play with 60 year old grandson   Prior Functioning  Home Living Lives With: Son Available Help at Discharge: Family;Available 24 hours/day Type of Home: House Home Access: Stairs to enter [employer] of Steps: 4 Entrance Stairs-Rails: Can reach both Home Layout: One level Bathroom Shower/Tub: Forensic scientist: Standard Bathroom Accessibility: Yes How Accessible: Accessible via walker Home Adaptive Equipment: Walker - rolling;Straight cane Prior Function Level of Independence: Independent with assistive device(s);Independent (RW as needed) Able to Take Stairs?: Yes Driving: Yes Vocation: On disability Communication Communication: No  difficulties Dominant Hand:  Right    Cognition  Overall Cognitive Status: Appears within functional limits for tasks assessed/performed Arousal/Alertness: Awake/alert Orientation Level: Appears intact for tasks assessed Behavior During Session: [facility] for tasks performed    Extremity/Trunk Assessment Right Lower Extremity Assessment RLE ROM/Strength/Tone: Unable to fully assess;Due to pain RLE Sensation: Deficits RLE Sensation Deficits: absent sensation on 1st MT, decreased sensation below R knee Left Lower Extremity Assessment LLE ROM/Strength/Tone: Within functional levels LLE Sensation: WFL - Light Touch   Balance    End of Session PT - End of Session Equipment Utilized During Treatment: Gait belt Activity Tolerance: Patient limited by pain Patient left: in bed;with call bell/phone within reach Nurse Communication: Mobility status   Milana Kidney 11/25/2011, 9:18 AM  11/25/2011 Milana Kidney DPT PAGER: [phone] OFFICE: [phone]

## 2011-11-25 NOTE — Evaluation (Addendum)
Occupational Therapy Evaluation Patient Details Name: [name] [name] MRN: [id number] DOB: June 11, 1950 Today's Date: 11/25/2011 Time: 0828-0902 OT Time Calculation (min): 34 min  OT Assessment / Plan / Recommendation Clinical Impression  This 61 yo male s/p R fem to above knee pop presents to acute OT with problems below. Will benefit from acute OT with follow up HHOT.    OT Assessment  Patient needs continued OT Services    Follow Up Recommendations  Home health OT;Supervision/Assistance - 24 hour    Barriers to Discharge None    Equipment Recommendations  3 in 1 bedside comode    Recommendations for Other Services    Frequency  Min 2X/week    Precautions / Restrictions Precautions Precautions: Fall Restrictions Weight Bearing Restrictions: No Other Position/Activity Restrictions: Have asked for post op shoe   Pertinent Vitals/Pain 9/10 RLE with activity    ADL  Eating/Feeding: Performed;Independent Where Assessed - Eating/Feeding: Chair Grooming: Simulated;Set up Where Assessed - Grooming: Supported sitting Upper Body Bathing: Simulated;Set up Where Assessed - Upper Body Bathing: Unsupported sitting Lower Body Bathing: Simulated;Maximal assistance Where Assessed - Lower Body Bathing: Supported sit to stand Upper Body Dressing: Simulated;Set up Where Assessed - Upper Body Dressing: Unsupported sitting Lower Body Dressing: Simulated Where Assessed - Lower Body Dressing: Sopported sit to stand Toilet Transfer: Simulated;+2 Total assistance Toilet Transfer: Patient Percentage: 60% Toilet Transfer Method: Sit to Barista:  (recliner to bed (next to each other)) Toileting - Clothing Manipulation and Hygiene: Simulated;Moderate assistance Where Assessed - Toileting Clothing Manipulation and Hygiene: Standing Equipment Used: Gait belt;Rolling walker Transfers/Ambulation Related to ADLs: total A +2 pt=60%    OT Diagnosis: Generalized weakness;Acute  pain  OT Problem List: Decreased strength;Decreased range of motion;Impaired balance (sitting and/or standing);Decreased knowledge of use of DME or AE;Pain;Increased edema OT Treatment Interventions: Self-care/ADL training;DME and/or AE instruction;Patient/family education;Balance training   OT Goals Acute Rehab OT Goals OT Goal Formulation: With patient Time For Goal Achievement: 12/09/11 ADL Goals Pt Will Perform Grooming: with set-up;Unsupported;Standing at sink ADL Goal: Grooming - Progress: Goal set today Pt Will Perform Lower Body Bathing: Unsupported;Sit to stand from bed;Sit to stand from chair;with modified independence;with adaptive equipment ADL Goal: Lower Body Bathing - Progress: Goal set today Pt Will Perform Lower Body Dressing: Independently;Sit to stand from bed;Sit to stand from chair;Unsupported;with adaptive equipment ADL Goal: Lower Body Dressing - Progress: Goal set today Pt Will Transfer to Toilet: with modified independence;Ambulation;with DME;3-in-1 ADL Goal: Toilet Transfer - Progress: Goal set today Pt Will Perform Toileting - Clothing Manipulation: Independently;Standing ADL Goal: Toileting - Clothing Manipulation - Progress: Goal set today Pt Will Perform Toileting - Hygiene: Independently;Sit to stand from 3-in-1/toilet ADL Goal: Toileting - Hygiene - Progress: Goal set today Miscellaneous OT Goals Miscellaneous OT Goal #1: Pt will be independent in and OOB for BADLs and transers. OT Goal: Miscellaneous Goal #1 - Progress: Goal set today  Visit Information  Last OT Received On: 11/25/11 Assistance Needed: +2 PT/OT Co-Evaluation/Treatment: Yes    Subjective Data  Subjective: That went pretty well, yall know what yall are doing Patient Stated Goal: I want to get back to walking so I can shoot some hoops with my 40 year old son   Prior Functioning  Home Living Lives With: Son Available Help at Discharge: Family;Available 24 hours/day Type of Home:  House Home Access: Stairs to enter [employer] of Steps: 4 Entrance Stairs-Rails: Can reach both Home Layout: One level Bathroom Shower/Tub: Forensic scientist:  Standard Bathroom Accessibility: Yes How Accessible: Accessible via walker Home Adaptive Equipment: Walker - rolling;Straight cane Prior Function Level of Independence: Independent with assistive device(s);Independent (RW as needed) Able to Take Stairs?: Yes Driving: Yes Vocation: On disability Communication Communication: No difficulties Dominant Hand: Right    Cognition  Overall Cognitive Status: Appears within functional limits for tasks assessed/performed Arousal/Alertness: Awake/alert Orientation Level: Appears intact for tasks assessed Behavior During Session: [facility] for tasks performed    Extremity/Trunk Assessment Right Upper Extremity Assessment RUE ROM/Strength/Tone: Within functional levels Left Upper Extremity Assessment LUE ROM/Strength/Tone: Within functional levels Right Lower Extremity Assessment RLE ROM/Strength/Tone: Unable to fully assess;Due to pain RLE Sensation: Deficits RLE Sensation Deficits: absent sensation on 1st MT, decreased sensation below R knee Left Lower Extremity Assessment LLE ROM/Strength/Tone: Within functional levels LLE Sensation: WFL - Light Touch   Mobility Bed Mobility Bed Mobility: Sit to Supine Sit to Supine: 4: Min assist Details for Bed Mobility Assistance: Min assist with weight of RLE. Pt able to control rest of body into supine.  Transfers Sit to Stand: 1: +2 Total assist Sit to Stand: Patient Percentage: 60% Stand to Sit: 3: Mod assist Details for Transfer Assistance: VC for proper sequencing and safety as well as hand placements. Assist to maintain stability throughout transfer. Pt able to take steps with minimal weightbearing through RLE, majority of support on RW.   Exercise    Balance    End of Session OT - End of  Session Equipment Utilized During Treatment: Gait belt Activity Tolerance: Patient limited by pain Patient left: in bed;with call bell/phone within reach Nurse Communication:  (Pt already up this AM with nursing)   Evette Georges [phone] 11/25/2011, 11:32 AM

## 2011-11-25 NOTE — Progress Notes (Signed)
VASCULAR LAB PRELIMINARY  PRELIMINARY  PRELIMINARY  PRELIMINARY     Preliminary report:  VASCULAR LAB PRELIMINARY  ARTERIAL  ABI completed:    RIGHT    LEFT    PRESSURE WAVEFORM  PRESSURE WAVEFORM  BRACHIAL 93 Triphasic BRACHIAL 93 Triphasic  DP 59 Dampened Monophasic DP 77 Monophasic         PT 65 Dampened Monophasic PT 77 Monophasic                  RIGHT LEFT  ABI 0.70 0.83   ABI on the right indicates a moderate reduction in arterial flow. Left ABI indicates a mild reduction in arterial flow.   [name] [name], RVS 11/25/2011, 3:18 PM

## 2011-11-25 NOTE — Progress Notes (Addendum)
VASCULAR [facility] SPECIALISTS OF [place]  Progress Note Bypass Surgery  Date of Surgery: 11/24/2011  Procedure(s): BYPASS GRAFT FEMORAL-POPLITEAL ARTERY Surgeon: Surgeon(s): Larina Earthly, MD  1 Day Post-Op  History of Present Illness  [name] [name] is a 61 y.o. male who is S/P Procedure(s): BYPASS GRAFT FEMORAL-POPLITEAL ARTERY right.  The patient's pre-op symptoms of pain are Improved . Patients pain is well controlled.    VASC. LAB Studies:        ABI: pending  Imaging: Dg Chest Port 1 View  11/24/2011  *RADIOLOGY REPORT*  Clinical Data: Central venous catheter placement.  PORTABLE CHEST - 1 VIEW 11/24/2011 1450 hours:  Comparison: Two-view chest x-ray 11/22/2011.  Findings: Right jugular central venous catheter tip projects over the SVC.  No evidence of mediastinal hematoma or pneumothorax. Mild linear atelectasis in the lung bases due to suboptimal inspiration.  Lungs otherwise clear.  Cardiac silhouette upper normal in size for technique and degree of inspiration.  IMPRESSION:  1.  Right jugular central venous catheter tip in the SVC.  No acute complicating features. 2.  Suboptimal inspiration accounts for mild bibasilar atelectasis. No acute cardiopulmonary disease otherwise.  Original Report Authenticated By: Arnell Sieving, M.D.    Significant Diagnostic Studies: CBC Lab Results  Component Value Date   WBC 6.3 11/25/2011   HGB 10.6* 11/25/2011   HCT 31.5* 11/25/2011   MCV 99.7 11/25/2011   PLT 120* 11/25/2011    BMET     Component Value Date/Time   NA 139 11/25/2011 0500   K 4.1 11/25/2011 0500   CL 106 11/25/2011 0500   CO2 25 11/25/2011 0500   GLUCOSE 144* 11/25/2011 0500   BUN 18 11/25/2011 0500   CREATININE 0.91 11/25/2011 0500   CALCIUM 9.0 11/25/2011 0500   GFRNONAA >90 11/25/2011 0500   GFRAA >90 11/25/2011 0500    COAG Lab Results  Component Value Date   INR 1.01 11/22/2011   No results found for this basename: PTT    Physical Examination  BP Readings  from Last 3 Encounters:  11/25/11 136/65  11/25/11 136/65  11/22/11 121/79   Temp Readings from Last 3 Encounters:  11/25/11 98.8 F (37.1 C) Oral  11/25/11 98.8 F (37.1 C) Oral  11/22/11 97.7 F (36.5 C) Oral   SpO2 Readings from Last 3 Encounters:  11/25/11 98%  11/25/11 98%  11/22/11 100%   Pulse Readings from Last 3 Encounters:  11/25/11 80  11/25/11 80  11/22/11 53    Pt is A&O x 3 right lower extremity: Incision/s is/are clean,dry.intact, and  healing without hematoma, erythema or drainage Limb is warm; with good color Right foot dressing C/D/I will start dressing changes tomorrow. Palpable weak pulse medial PT.    Assessment/Plan: Pt. Doing well Post-op pain is controlled Wounds are clean, dry, intact PT/OT for ambulation Continue wound care as ordered re start foot dressing changes with santyl in AM tomorrow. Transfer to [facility] [phone] 11/25/2011 7:14 AM   I agree with the above. The patient was seen and examined. He has excellent Doppler signals in his foot. His dressings remained clean and dry. He'll be transferred to the floor.  Durene Cal

## 2011-11-26 LAB — GLUCOSE, CAPILLARY: Glucose-Capillary: 92 mg/dL (ref 70–99)

## 2011-11-26 MED ORDER — SODIUM CHLORIDE 0.9 % IV SOLN
INTRAVENOUS | Status: DC
  Administered 2011-11-26 – 2011-11-27 (×2): 20 mL/h via INTRAVENOUS

## 2011-11-26 MED ORDER — SODIUM CHLORIDE 0.9 % IV SOLN: 500.0000 mL | Freq: Once | INTRAVENOUS | Status: DC

## 2011-11-26 MED ORDER — LISINOPRIL 20 MG PO TABS
20.0000 mg | ORAL_TABLET | Freq: Every day | ORAL | Status: DC
  Administered 2011-11-27 – 2011-11-28 (×2): 20 mg via ORAL
  Filled 2011-11-26 (×3): qty 1

## 2011-11-26 MED ORDER — HYDROCHLOROTHIAZIDE 12.5 MG PO CAPS
12.5000 mg | ORAL_CAPSULE | Freq: Every day | ORAL | Status: DC
  Administered 2011-11-27 – 2011-11-28 (×2): 12.5 mg via ORAL
  Filled 2011-11-26 (×3): qty 1

## 2011-11-26 MED ORDER — SODIUM CHLORIDE 0.9 % IV SOLN
250.0000 mL | Freq: Once | INTRAVENOUS | Status: AC
  Administered 2011-11-26: 250 mL via INTRAVENOUS

## 2011-11-26 NOTE — Progress Notes (Signed)
Patient refused to ambulated this evening due to leg pain.  Patient encouraged to walk in the morning. Will continue to monitor.

## 2011-11-26 NOTE — Progress Notes (Signed)
Patient has had some consistent low blood pressures.  Please see vital signs for trends. Will continue to monitor. Took blood pressure in left arm and it was 90/58 manually.  Will continue to monitor.

## 2011-11-26 NOTE — Progress Notes (Addendum)
[facility] OF [place]  Progress Note Bypass Surgery  Date of Surgery: 11/24/2011  Procedure(s): BYPASS GRAFT FEMORAL-POPLITEAL ARTERY Surgeon: Surgeon(s): Larina Earthly, MD  2 Days Post-Op  History of Present Illness  [name] [name] is a 61 y.o. male who is S/P Procedure(s): BYPASS GRAFT FEMORAL-POPLITEAL ARTERY right.  The patient's pre-op symptoms of pain are Improved . Patients pain is well controlled.        Significant Diagnostic Studies: CBC Lab Results  Component Value Date   WBC 6.3 11/25/2011   HGB 10.6* 11/25/2011   HCT 31.5* 11/25/2011   MCV 99.7 11/25/2011   PLT 120* 11/25/2011    BMET     Component Value Date/Time   NA 139 11/25/2011 0500   K 4.1 11/25/2011 0500   CL 106 11/25/2011 0500   CO2 25 11/25/2011 0500   GLUCOSE 144* 11/25/2011 0500   BUN 18 11/25/2011 0500   CREATININE 0.91 11/25/2011 0500   CALCIUM 9.0 11/25/2011 0500   GFRNONAA >90 11/25/2011 0500   GFRAA >90 11/25/2011 0500    COAG Lab Results  Component Value Date   INR 1.01 11/22/2011   No results found for this basename: PTT    Physical Examination  BP Readings from Last 3 Encounters:  11/26/11 90/58  11/26/11 90/58  11/22/11 121/79   Temp Readings from Last 3 Encounters:  11/26/11 98.2 F (36.8 C) Oral  11/26/11 98.2 F (36.8 C) Oral  11/22/11 97.7 F (36.5 C) Oral   SpO2 Readings from Last 3 Encounters:  11/26/11 93%  11/26/11 93%  11/22/11 100%   Pulse Readings from Last 3 Encounters:  11/26/11 76  11/26/11 76  11/22/11 53    Pt is A&O x 3 right lower extremity: Incision/s is/are clean,dry.intact, and  draining without hematoma, erythema or drainage on dressing no active drainage. Clean dry foot dressing with santyl at bed side today.  Yellow base 1 cm x 1.0 cm second wound is 1.5 cm circum.  Limb is warm; with good color    Assessment/Plan: Pt. Doing well Post-op pain is controlled Wounds are clean, dry, intact PT/OT for  ambulation Continue wound care as ordered BP low will give 250 cc bolus NS and have patient drink free water.  Hemoglobin stable, no active bleeding. Hold narcotics until BP stabilizes.  [name] [name] [facility] [phone] 11/26/2011 8:41 AM  I agree with the above. The patient has been hypotensive overnight but asymptomatic. I will hold some of his blood pressure medicine today. His hypotension could potentially be exacerbated by his high pain medicine requirement. His surgical dressings were removed today. All wounds look healthy. Hopefully he can be discharged within the next one to 2 days  [name] [name]

## 2011-11-26 NOTE — Progress Notes (Signed)
Physical Therapy Treatment Patient Details Name: [name] [name] MRN: [id number] DOB: 1950-12-29 Today's Date: 11/26/2011 Time: 8295-6213 PT Time Calculation (min): 14 min  PT Assessment / Plan / Recommendation Comments on Treatment Session  Slow improvements with mobility.  Limited by pain.    Follow Up Recommendations  Home health PT;Supervision/Assistance - 24 hour    Barriers to Discharge        Equipment Recommendations  None recommended by PT    Recommendations for Other Services    Frequency Min 4X/week   Plan Discharge plan remains appropriate;Frequency remains appropriate    Precautions / Restrictions Precautions Precautions: Fall Restrictions Weight Bearing Restrictions: No   Pertinent Vitals/Pain Pain limiting session today.    Mobility  Bed Mobility Bed Mobility: Sit to Supine Sit to Supine: 4: Min guard;HOB elevated;With rail Details for Bed Mobility Assistance: Verbal cues for technique.  Patient able to move RLE off bed independently. Transfers Transfers: Sit to Stand;Stand to Sit;Stand Pivot Transfers Sit to Stand: 1: +2 Total assist;With upper extremity assist;From bed Sit to Stand: Patient Percentage: 80% Stand to Sit: 3: Mod assist;With upper extremity assist;With armrests;To chair/3-in-1 Details for Transfer Assistance: Verbal cues for safe hand placement during transfers. Assist for controlled descent into chair. Ambulation/Gait Ambulation/Gait Assistance: 3: Mod assist Ambulation Distance (Feet): 4 Feet Assistive device: Rolling walker Ambulation/Gait Assistance Details: Patient able to take 4-5 steps to chair with RW.  Verbal cues for safe use of RW and for gait sequence. Gait Pattern: Step-to pattern;Decreased stance time - right;Antalgic      PT Goals Acute Rehab PT Goals PT Goal: Supine/Side to Sit - Progress: Progressing toward goal PT Goal: Sit to Stand - Progress: Progressing toward goal PT Goal: Stand to Sit - Progress: Progressing  toward goal PT Transfer Goal: Bed to Chair/Chair to Bed - Progress: Progressing toward goal PT Goal: Ambulate - Progress: Progressing toward goal  Visit Information  Last PT Received On: 11/26/11 Assistance Needed: +2    Subjective Data  Subjective: "My foot hurts bad."   Cognition  Overall Cognitive Status: Appears within functional limits for tasks assessed/performed Arousal/Alertness: Awake/alert Orientation Level: Appears intact for tasks assessed Behavior During Session: [facility] for tasks performed    Balance     End of Session PT - End of Session Equipment Utilized During Treatment: Gait belt Activity Tolerance: Patient limited by pain Patient left: in chair;with call bell/phone within reach Nurse Communication: Patient requests pain meds;Mobility status    Vena Austria 11/26/2011, 1:42 PM Durenda Hurt. Renaldo Fiddler, [facility] Acute Rehab Services Pager [phone]

## 2011-11-27 LAB — GLUCOSE, CAPILLARY
Glucose-Capillary: 104 mg/dL — ABNORMAL HIGH (ref 70–99)
Glucose-Capillary: 96 mg/dL (ref 70–99)

## 2011-11-27 LAB — BASIC METABOLIC PANEL
CO2: 26 mEq/L (ref 19–32)
Calcium: 9.2 mg/dL (ref 8.4–10.5)
Chloride: 105 mEq/L (ref 96–112)
Glucose, Bld: 100 mg/dL — ABNORMAL HIGH (ref 70–99)
Sodium: 140 mEq/L (ref 135–145)

## 2011-11-27 LAB — CBC
HCT: 28 % — ABNORMAL LOW (ref 39.0–52.0)
Hemoglobin: 9.3 g/dL — ABNORMAL LOW (ref 13.0–17.0)
MCH: 33.2 pg (ref 26.0–34.0)
MCV: 100 fL (ref 78.0–100.0)
RBC: 2.8 MIL/uL — ABNORMAL LOW (ref 4.22–5.81)
WBC: 6.1 10*3/uL (ref 4.0–10.5)

## 2011-11-27 MED ORDER — OXYCODONE HCL 20 MG PO TABS: 20.0000 mg | ORAL_TABLET | Freq: Four times a day (QID) | ORAL | Status: DC | PRN

## 2011-11-27 NOTE — Progress Notes (Signed)
Patient ambulated with RN from the chair to the door and returned to bed using a walker. Patient's gait was slow and steady and he tolerated the ambulation well. Will continue to monitor.

## 2011-11-27 NOTE — Progress Notes (Signed)
Occupational Therapy Treatment Patient Details Name: [name] [name] MRN: [id number] DOB: April 21, 1951 Today's Date: 11/27/2011 Time: 7829-5621 OT Time Calculation (min): 34 min  OT Assessment / Plan / Recommendation Comments on Treatment Session Pt making great progress towards goals. Pt pleasant, cooperative and motivated.    Follow Up Recommendations  Home health OT    Barriers to Discharge       Equipment Recommendations  3 in 1 bedside comode    Recommendations for Other Services    Frequency Min 2X/week   Plan Discharge plan remains appropriate    Precautions / Restrictions Precautions Precautions: None Restrictions Weight Bearing Restrictions: No   Pertinent Vitals/Pain Pt reported 9/10 pain. RN made aware.    ADL  Grooming: Performed;Teeth care;Supervision/safety Where Assessed - Grooming: Supported standing Lower Body Dressing: Performed;Set up (donned sx bootie over L foot with LE propped on bed.) Toilet Transfer: Performed;Min guard Toilet Transfer Method: Sit to Barista: Other (comment) (recliner) ADL Comments: Pt ambulated to stairwell and climbed 3 steps with min guard A    OT Diagnosis:    OT Problem List:   OT Treatment Interventions:     OT Goals ADL Goals Pt Will Perform Grooming: with modified independence ADL Goal: Grooming - Progress: Updated due to goal met ADL Goal: Lower Body Dressing - Progress: Progressing toward goals ADL Goal: Toilet Transfer - Progress: Progressing toward goals Miscellaneous OT Goals OT Goal: Miscellaneous Goal #1 - Progress: Met  Visit Information  Last OT Received On: 11/27/11 Assistance Needed: +1    Subjective Data  Subjective: My pain has let off a little. Now its only a 9..."   Prior Functioning       Cognition  Overall Cognitive Status: Appears within functional limits for tasks assessed/performed Arousal/Alertness: Awake/alert Orientation Level: Appears intact for tasks  assessed Behavior During Session: [facility] for tasks performed    Mobility Bed Mobility Bed Mobility: Supine to Sit Supine to Sit: 6: Modified independent (Device/Increase time) Transfers Sit to Stand: 4: Min guard;From bed;From chair/3-in-1;With armrests Stand to Sit: 4: Min guard;To chair/3-in-1;With armrests Details for Transfer Assistance: cueing for hand placement, sequence and safety   Exercises General Exercises - Lower Extremity Long Arc Quad: AROM;Right;20 reps;Seated  Balance    End of Session OT - End of Session Equipment Utilized During Treatment: Gait belt Activity Tolerance: Patient tolerated treatment well Patient left: in chair;with call bell/phone within reach   Devita Nies A OTR/L [phone] 11/27/2011, 10:57 AM

## 2011-11-27 NOTE — Progress Notes (Signed)
Physical Therapy Treatment Patient Details Name: [name] [name] MRN: [id number] DOB: 1950/09/19 Today's Date: 11/27/2011 Time: 4098-1191 PT Time Calculation (min): 30 min  PT Assessment / Plan / Recommendation Comments on Treatment Session  Pt s/p Rfempop with excellent progression with mobility today and encouraged to continue RLE HEP and increase ambulation with nursing. Will continue to follow.    Follow Up Recommendations       Barriers to Discharge        Equipment Recommendations       Recommendations for Other Services    Frequency     Plan Discharge plan remains appropriate;Frequency remains appropriate    Precautions / Restrictions Precautions Precautions: None Restrictions Weight Bearing Restrictions: No   Pertinent Vitals/Pain 9/10 pain surgical before and after medicated before ambulation    Mobility  Bed Mobility Bed Mobility: Supine to Sit Supine to Sit: 6: Modified independent (Device/Increase time) Transfers Transfers: Sit to Stand;Stand to Sit Sit to Stand: 4: Min guard;From bed;From chair/3-in-1;With armrests Stand to Sit: 4: Min guard;To chair/3-in-1;With armrests Details for Transfer Assistance: cueing for hand placement, sequence and safety Ambulation/Gait Ambulation/Gait Assistance: 5: Supervision Ambulation Distance (Feet): 180 Feet (20' then 180') Assistive device: Rolling walker Ambulation/Gait Assistance Details: cueing to increased stride length and for sequence Gait Pattern: Step-through pattern;Decreased stride length Stairs: Yes Stairs Assistance: 5: Supervision Stairs Assistance Details (indicate cue type and reason): cueing for sequence Stair Management Technique: Two rails Number of Stairs: 3     Exercises General Exercises - Lower Extremity Long Arc Quad: AROM;Right;20 reps;Seated   PT Diagnosis:    PT Problem List:   PT Treatment Interventions:     PT Goals Acute Rehab PT Goals PT Goal: Supine/Side to Sit - Progress:  Met PT Goal: Sit to Stand - Progress: Progressing toward goal PT Goal: Stand to Sit - Progress: Progressing toward goal PT Transfer Goal: Bed to Chair/Chair to Bed - Progress: Met Pt will Ambulate: >150 feet;with modified independence;with least restrictive assistive device PT Goal: Ambulate - Progress: Updated due to goal met PT Goal: Up/Down Stairs - Progress: Met  Visit Information  Last PT Received On: 11/27/11 Assistance Needed: +1 PT/OT Co-Evaluation/Treatment: Yes    Subjective Data  Subjective: that's a long walk   Cognition  Overall Cognitive Status: Appears within functional limits for tasks assessed/performed Arousal/Alertness: Awake/alert Orientation Level: Appears intact for tasks assessed Behavior During Session: [facility] for tasks performed    Balance     End of Session PT - End of Session Equipment Utilized During Treatment: Gait belt Activity Tolerance: Patient tolerated treatment well Patient left: in chair;with call bell/phone within reach Nurse Communication: Mobility status    Delorse Lek 11/27/2011, 10:55 AM Delaney Meigs, PT [phone]

## 2011-11-27 NOTE — Discharge Instructions (Signed)
Call wound center to set up appointment for foot wound care

## 2011-11-27 NOTE — Discharge Summary (Signed)
Vascular and Vein Specialists Discharge Summary   Patient ID:  [name] [name] MRN: [id number] DOB/AGE: 60/14/1952 60 y.o.  Admit date: 11/24/2011 Discharge date: 11/29/11 Date of Surgery: 11/24/2011 Surgeon: Surgeon(s): Larina Earthly, MD  Admission Diagnosis: Peripheral Arterial Disease  Discharge Diagnoses:  Peripheral Arterial Disease  Secondary Diagnoses: Past Medical History  Diagnosis Date  . Vein, varicose   . Hyperlipidemia   . Varicose veins   . Complication of anesthesia     difficulty waking up  . Hypertension     Takes prinzide daily  . Diabetes mellitus     not consistent on taking medications  . Peripheral vascular disease   . Chronic kidney disease     acute renal failure in February admission  . Neuromuscular disorder     gout and neuropathy  . Anemia     hx of blood transfusion  . Wound check, dressing change     dressing changes 3x/week; open wound    Procedure(s): BYPASS GRAFT FEMORAL-POPLITEAL ARTERY  Discharged Condition: good  HPI:  [name] [name] is a 61 y.o. male had  evaluation by Dr. Arbie Cookey of a lower extremity arterial insufficiency. He is a 61 year old black male with a several month history of ulcerations over the right foot over his lateral ankle and lateral foot. He has been seen at the wound center and has been treated with hyperbaric treatment. He has severe pain and has not had any improvement of this. We saw him for further evaluation. 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 The study was from 11/07/2011. The patient does have a long history of claudication type symptoms on the right. His past history is also significant for prior venous disease. He has scars and skin changes to suggest a prior [place] procedure on his left leg.  He does have severe venous stasis changes. Pt had an angiogram on 11/17/11 which showed Impression: #1 no evidence of aortoiliac occlusive disease  #2 right superficial femoral artery occlusion with reconstitution above-knee popliteal and three-vessel runoff  #3 patent left superficial femoral artery and three-vessel runoff  he was admitted for right above knee fem-pop with vein  Hospital Course:  [name] [name] is a 61 y.o. male is S/P Right femoral to above-knee popliteal with translocated nonreversible saphenous vein on 11/24/11 Extubated: POD # 0 Post-op wounds healing well Wounds to right foot stable and being treated with dressing changes. Pt. Ambulating with assistance, voiding and taking PO diet without difficulty. Pt pain controlled with PO pain meds. Labs as below Complications:none Post op day 2, he did have some hypotension, but was asymptomatic.   Consults:     Significant Diagnostic Studies: CBC Lab Results  Component Value Date   WBC 6.1 11/27/2011   HGB 9.3* 11/27/2011   HCT 28.0* 11/27/2011   MCV 100.0 11/27/2011   PLT 124* 11/27/2011    BMET    Component Value Date/Time   NA 140 11/27/2011 0525   K 3.9 11/27/2011 0525   CL 105 11/27/2011 0525   CO2 26 11/27/2011 0525   GLUCOSE 100* 11/27/2011 0525   BUN 21 11/27/2011 0525   CREATININE 0.90 11/27/2011 0525   CALCIUM 9.2 11/27/2011 0525   GFRNONAA >90 11/27/2011 0525   GFRAA >90 11/27/2011 0525   COAG  Lab Results  Component Value Date   INR 1.01 11/22/2011     Disposition:  Discharge to :Home Discharge Orders    Future Orders Please Complete By Expires   Resume previous diet      Driving Restrictions      Comments:   No driving   Call MD for:  temperature >100.5      Call MD for:  redness, tenderness, or signs of infection (pain, swelling, bleeding, redness, odor or green/yellow discharge around incision site)      Call MD for:  severe or increased pain, loss or decreased feeling  in affected limb(s)       Increase activity slowly      Comments:   Walk with assistance use walker or cane as needed   Walker       Discharge wound care:      Comments:   Continue wound care to foot as instructed prior to admission      [place], [place]  Home Medication Instructions [id number]   Printed on:11/27/11 1648  Medication Information                    gabapentin (NEURONTIN) 800 MG tablet Take 800 mg by mouth 2 (two) times daily with a meal.           metFORMIN (GLUCOPHAGE) 500 MG tablet Take 500 mg by mouth 2 (two) times daily with a meal.           colchicine 0.6 MG tablet Take 0.6 mg by mouth daily.           pravastatin (PRAVACHOL) 20 MG tablet Take 20 mg by mouth daily.           Ferrous Sulfate (IRON) 325 (65 FE) MG TABS Take 1 tablet by mouth daily.           doxycycline (VIBRA-TABS) 100 MG tablet Take 100 mg by mouth 2 (two) times daily.           lisinopril-hydrochlorothiazide (PRINZIDE,ZESTORETIC) 20-12.5 MG per tablet Take 1 tablet by mouth daily.           cephALEXin (KEFLEX) 500 MG capsule Take 1,000 mg by mouth 2 (two) times daily.           indomethacin (INDOCIN) 25 MG capsule Take 25 mg by mouth 2 (two) times daily with a meal.           collagenase (SANTYL) ointment Apply 1 application topically 2 (two) times daily.           vitamin B-12 (CYANOCOBALAMIN) 1000 MCG tablet Take 1,000 mcg by mouth daily.           Oxycodone HCl 20 MG TABS Take 1 tablet (20 mg total) by mouth 4 (four) times daily as needed (for pain). Take 1/2 - 1 tablet (10-20mg ) by mouth 2-4 times daily as needed for pain            Verbal and written Discharge instructions given to the patient. Wound care per Discharge AVS Follow-up Information    Follow up with EARLY, TODD, MD in 3 weeks. (office will arrrange-sent)    Contact information:   [address] [place] [address] [phone]          Signed: Marlowe Shores 11/27/2011, 4:48 PM    Skyanne Welle,  Iraida Cragin 11/29/2011 8:00 AM

## 2011-11-27 NOTE — Care Management Note (Signed)
    Page 1 of 2   11/28/2011     1:18:51 PM   CARE MANAGEMENT NOTE 11/28/2011  Patient:  [name] [name], [name] [name]   Account Number:  [id number]  Date Initiated:  11/24/2011  Documentation initiated by:  Donn Pierini  Subjective/Objective Assessment:   Pt admitted s/p fem pop     Action/Plan:   PTA pt lived at home with family   Anticipated DC Date:  11/28/2011   Anticipated DC Plan:  HOME W HOME HEALTH SERVICES      DC Planning Services  CM consult      [facility] Choice  HOME HEALTH  DURABLE MEDICAL EQUIPMENT   Choice offered to / List presented to:  C-1 Patient   DME arranged  Levan Hurst      DME agency  [facility]     [facility] arranged  HH-2 PT  HH-1 RN      [facility] agency  [facility]   Status of service:  Completed, signed off Medicare Important Message given?   (If response is "NO", the following Medicare IM given date fields will be blank) Date Medicare IM given:   Date Additional Medicare IM given:    Discharge Disposition:  HOME W HOME HEALTH SERVICES  Per UR Regulation:  Reviewed for med. necessity/level of care/duration of stay  If discussed at Long Length of Stay Meetings, dates discussed:    Comments:  PCP- Ardath Sax  11/28/11  1317  Jeff Frieden SIMMONS RN, BSN [phone] REFERRAL PLACED TO [facility] WITH [facility] FOR RW; PT READY FOR DISCHARGE.  11/27/11  1500  Taffy Delconte SIMMONS RN, BSN [phone] RECEIVED REFERRAL FOR HHRN/ PT; SPOKE WITH PT AT BEDSIDE; REFERRAL PLACED TO [facility] WITH [facility] FOR HHRN/PT PER PT CHOICE; HAS CANE AT HOME ALREADY; SOC DATE: WITHIN 24-48HRS POST D/C.   11/24/11- 1640- Donn Pierini RN, BSN [phone] UR completed, pt admitted from PACU- anticpate return home- NCM to follow progress post op

## 2011-11-27 NOTE — Progress Notes (Signed)
VASCULAR PROGRESS NOTE  SUBJECTIVE: no specific complaints. Has been ambulating.  PHYSICAL EXAM: Filed Vitals:   11/26/11 2038 11/27/11 0330 11/27/11 0434 11/27/11 0435  BP: 100/62 100/68 85/48 92/54   Pulse: 96 71 75 68  Temp: 98.8 F (37.1 C)  98.6 F (37 C)   TempSrc: Oral  Oral   Resp: 20  19   Height:      Weight:      SpO2: 95%  100% 100%   Incisions looked good. Moderate right leg swelling. The wound was inspected. These are fairly deep. There is no significant drainage.  LABS: Lab Results  Component Value Date   WBC 6.1 11/27/2011   HGB 9.3* 11/27/2011   HCT 28.0* 11/27/2011   MCV 100.0 11/27/2011   PLT 124* 11/27/2011   Lab Results  Component Value Date   CREATININE 0.90 11/27/2011   Lab Results  Component Value Date   INR 1.01 11/22/2011   CBG (last 3)   Basename 11/27/11 0556 11/26/11 2149 11/26/11 1625  GLUCAP 104* 121* 88   ASSESSMENT/PLAN: 1. 3 Days Post-Op s/p: right femoropopliteal bypass graft. 2. Dressing changes need to be deep into these wounds which are fairly deep. Needs home health for dressing changes. 3. Continue ambulation. 4. Elevate legs when in bed. 5. Anticipate discharge tomorrow with home health nurse doing dressing changes and follow up with Dr. Arbie Cookey in approximately 3 weeks.  Waverly Ferrari, MD, FACS Beeper: [phone] 11/27/2011

## 2011-11-28 LAB — GLUCOSE, CAPILLARY
Glucose-Capillary: 95 mg/dL (ref 70–99)
Glucose-Capillary: 97 mg/dL (ref 70–99)
Glucose-Capillary: 78 mg/dL (ref 70–99)

## 2011-11-28 NOTE — Progress Notes (Signed)
VASCULAR PROGRESS NOTE  SUBJECTIVE: Thinks he needs one more day to be able to go home safely.  PHYSICAL EXAM: Filed Vitals:   11/27/11 1330 11/27/11 2019 11/28/11 0353 11/28/11 1018  BP: 107/67 124/76 104/65 123/77  Pulse: 68 67 71 66  Temp: 98.2 F (36.8 C) 98.5 F (36.9 C) 97.9 F (36.6 C)   TempSrc: Oral Oral Oral   Resp: 18 19 18 18   Height:      Weight:      SpO2: 98% 99% 96% 97%   Incisions all look fine Moderate bilat. Leg swelling Right foot warm  CBG (last 3)   Basename 11/28/11 0657 11/27/11 2025 11/27/11 1606  GLUCAP 95 88 93   ASSESSMENT/PLAN: 1. 4 Days Post-Op s/p: Right FemPop 2. D/C tomorrow with home health doing dressing changes.   Waverly Ferrari, MD, FACS Beeper: [phone] 11/28/2011

## 2011-11-28 NOTE — Progress Notes (Signed)
Physical Therapy Treatment Patient Details Name: [name] [name] MRN: [id number] DOB: 1950/06/27 Today's Date: 11/28/2011 Time: 4098-1191 PT Time Calculation (min): 24 min  PT Assessment / Plan / Recommendation Comments on Treatment Session  Pt s/p R fempop with continued excellent mobility today. Pt continues to have pain in RLE and foot due to surgery and neuropathy and will continue to follow.     Follow Up Recommendations  Home health PT    Barriers to Discharge        Equipment Recommendations       Recommendations for Other Services    Frequency     Plan Discharge plan needs to be updated;Frequency remains appropriate    Precautions / Restrictions Precautions Precautions: None Restrictions Weight Bearing Restrictions: No   Pertinent Vitals/Pain 9/10 RLE surgical pain, premedicated, repositioned    Mobility  Bed Mobility Bed Mobility: Not assessed Transfers Transfers: Sit to Stand;Stand to Sit Sit to Stand: 6: Modified independent (Device/Increase time);From chair/3-in-1;With armrests Stand to Sit: 6: Modified independent (Device/Increase time);To chair/3-in-1;With armrests Ambulation/Gait Ambulation/Gait Assistance: 5: Supervision Ambulation Distance (Feet): 500 Feet Assistive device: Rolling walker Ambulation/Gait Assistance Details: cueing to increase stride and trunk extension as well as stepping into RW Gait Pattern: Step-through pattern;Decreased stride length Gait velocity: WFL Stairs: No    Exercises General Exercises - Lower Extremity Long Arc Quad: AROM;Both;20 reps;Seated   PT Diagnosis:    PT Problem List:   PT Treatment Interventions:     PT Goals Acute Rehab PT Goals PT Goal: Sit to Stand - Progress: Met PT Goal: Stand to Sit - Progress: Met PT Goal: Ambulate - Progress: Progressing toward goal  Visit Information  Last PT Received On: 11/28/11 Assistance Needed: +1    Subjective Data  Subjective: i'm about [place]   Cognition  Overall Cognitive Status: Appears within functional limits for tasks assessed/performed Arousal/Alertness: Awake/alert Orientation Level: Appears intact for tasks assessed Behavior During Session: [facility] for tasks performed    Balance     End of Session PT - End of Session Equipment Utilized During Treatment: Gait belt Activity Tolerance: Patient tolerated treatment well Patient left: in chair;with call bell/phone within reach Nurse Communication: Mobility status    Delorse Lek 11/28/2011, 12:08 PM Delaney Meigs, PT [phone]

## 2011-11-29 NOTE — Progress Notes (Signed)
VASCULAR PROGRESS NOTE  SUBJECTIVE: Pt in the bathroom, but has no complaints  PHYSICAL EXAM: Filed Vitals:   11/28/11 0353 11/28/11 1018 11/28/11 2052 11/29/11 0530  BP: 104/65 123/77 137/86 108/66  Pulse: 71 66 93 63  Temp: 97.9 F (36.6 C)  97.6 F (36.4 C) 97.1 F (36.2 C)  TempSrc: Oral  Oral Oral  Resp: 18 18 18 18   Height:      Weight:      SpO2: 96% 97% 97% 97%    LABS: Lab Results  Component Value Date   WBC 6.1 11/27/2011   HGB 9.3* 11/27/2011   HCT 28.0* 11/27/2011   MCV 100.0 11/27/2011   PLT 124* 11/27/2011   Lab Results  Component Value Date   CREATININE 0.90 11/27/2011   Lab Results  Component Value Date   INR 1.01 11/22/2011   CBG (last 3)   Basename 11/29/11 0529 11/28/11 2050 11/28/11 1611  GLUCAP 87 78 72     ASSESSMENT/PLAN: 1. 5 Days Post-Op s/p: right fem pop 2. Pending evaluation later today, should be ready for D/C today.  Waverly Ferrari, MD, FACS Beeper: [phone] 11/29/2011

## 2011-11-29 NOTE — Progress Notes (Signed)
Occupational Therapy Treatment Patient Details Name: [name] [name] MRN: [id number] DOB: Aug 12, 1950 Today's Date: 11/29/2011 Time: 7829-5621 OT Time Calculation (min): 9 min  OT Assessment / Plan / Recommendation Comments on Treatment Session Pt has met all goals as stated on IE. Feel HHOT is not necessary at this time.     Follow Up Recommendations  No OT follow up    Barriers to Discharge       Equipment Recommendations  None recommended by OT    Recommendations for Other Services    Frequency     Plan All goals met and education completed, patient discharged from OT services    Precautions / Restrictions Precautions Precautions: None   Pertinent Vitals/Pain     ADL  Lower Body Dressing: Modified independent Where Assessed - Lower Body Dressing: Unsupported sit to stand;Unsupported sitting Toilet Transfer: Simulated;Modified independent Toilet Transfer Method: Sit to stand ADL Comments: Pt was able to retrieve all clothing items from the closet and don pants and underwear at mod I level. Instructed pt to dress operated leg 1st and reverse the process for undressing. Pt was able to don socks and shoes by crossing one ankle over opposite knee    OT Diagnosis:    OT Problem List:   OT Treatment Interventions:     OT Goals ADL Goals ADL Goal: Grooming - Progress: Met ADL Goal: Lower Body Bathing - Progress: Met ADL Goal: Lower Body Dressing - Progress: Met ADL Goal: Toilet Transfer - Progress: Met ADL Goal: Toileting - Clothing Manipulation - Progress: Met ADL Goal: Toileting - Hygiene - Progress: Met Miscellaneous OT Goals OT Goal: Miscellaneous Goal #1 - Progress: Met  Visit Information  Last OT Received On: 11/29/11 Assistance Needed: +1    Subjective Data  Subjective: I'm getting out of here in a minute.   Prior Functioning       Cognition  Overall Cognitive Status: Appears within functional limits for tasks assessed/performed Arousal/Alertness:  Awake/alert Orientation Level: Appears intact for tasks assessed Behavior During Session: [facility] for tasks performed    Mobility Transfers Sit to Stand: 6: Modified independent (Device/Increase time);From bed;With upper extremity assist Stand to Sit: 6: Modified independent (Device/Increase time);To bed;With upper extremity assist   Exercises    Balance    End of Session OT - End of Session Activity Tolerance: Patient tolerated treatment well Patient left: in bed;with call bell/phone within reach  GO     [name] [name] A OTR/L [phone] 11/29/2011, 11:22 AM

## 2011-11-30 ENCOUNTER — Encounter (HOSPITAL_COMMUNITY): Payer: Self-pay | Admitting: Vascular Surgery

## 2011-11-30 LAB — GLUCOSE, CAPILLARY
Glucose-Capillary: 106 mg/dL — ABNORMAL HIGH (ref 70–99)
Glucose-Capillary: 109 mg/dL — ABNORMAL HIGH (ref 70–99)
Glucose-Capillary: 140 mg/dL — ABNORMAL HIGH (ref 70–99)
Glucose-Capillary: 130 mg/dL — ABNORMAL HIGH (ref 70–99)

## 2011-12-01 LAB — GLUCOSE, CAPILLARY
Glucose-Capillary: 98 mg/dL (ref 70–99)
Glucose-Capillary: 112 mg/dL — ABNORMAL HIGH (ref 70–99)

## 2011-12-04 ENCOUNTER — Encounter (HOSPITAL_BASED_OUTPATIENT_CLINIC_OR_DEPARTMENT_OTHER): Payer: Medicare (Managed Care) | Attending: General Surgery

## 2011-12-04 DIAGNOSIS — L97309 Non-pressure chronic ulcer of unspecified ankle with unspecified severity: Secondary | ICD-10-CM | POA: Insufficient documentation

## 2011-12-04 DIAGNOSIS — E1169 Type 2 diabetes mellitus with other specified complication: Secondary | ICD-10-CM | POA: Insufficient documentation

## 2011-12-04 DIAGNOSIS — L97509 Non-pressure chronic ulcer of other part of unspecified foot with unspecified severity: Secondary | ICD-10-CM | POA: Insufficient documentation

## 2011-12-05 LAB — GLUCOSE, CAPILLARY
Glucose-Capillary: 142 mg/dL — ABNORMAL HIGH (ref 70–99)
Glucose-Capillary: 98 mg/dL (ref 70–99)
Glucose-Capillary: 117 mg/dL — ABNORMAL HIGH (ref 70–99)

## 2011-12-11 LAB — GLUCOSE, CAPILLARY
Glucose-Capillary: 114 mg/dL — ABNORMAL HIGH (ref 70–99)
Glucose-Capillary: 105 mg/dL — ABNORMAL HIGH (ref 70–99)
Glucose-Capillary: 125 mg/dL — ABNORMAL HIGH (ref 70–99)

## 2011-12-13 LAB — GLUCOSE, CAPILLARY
Glucose-Capillary: 143 mg/dL — ABNORMAL HIGH (ref 70–99)
Glucose-Capillary: 109 mg/dL — ABNORMAL HIGH (ref 70–99)

## 2011-12-13 NOTE — Progress Notes (Signed)
[facility]  [name] [name], [name] [name]                ACCOUNT NO.:  [id number]  MEDICAL RECORD NO.:  [id number]      DATE OF BIRTH:  [birth year]  PHYSICIAN:  Ardath Sax, M.D.           VISIT DATE:                                  OFFICE VISIT   A 61 year old African American male who has diabetes.  He has had recent revascularization of his right leg by Dr. Arbie Cookey.  He has Wagner 3 ulcers both of the right ankle and the plantar aspect of his foot.  We have been treating him with Dermagraft, Santyl and alginate and at this point, he is now getting hyperbaric oxygen treatments and they have all of these treatments have been successful as his diabetic foot ulcers are healing and they are much smaller since he has been in hyperbaric oxygen, and we will probably continue with a Dermagraft.     Ardath Sax, M.D.     PP/MEDQ  D:  12/13/2011  T:  12/13/2011  Job:  [id number]

## 2011-12-18 LAB — GLUCOSE, CAPILLARY
Glucose-Capillary: 131 mg/dL — ABNORMAL HIGH (ref 70–99)
Glucose-Capillary: 181 mg/dL — ABNORMAL HIGH (ref 70–99)

## 2011-12-19 LAB — GLUCOSE, CAPILLARY: Glucose-Capillary: 107 mg/dL — ABNORMAL HIGH (ref 70–99)

## 2011-12-20 DIAGNOSIS — E1142 Type 2 diabetes mellitus with diabetic polyneuropathy: Secondary | ICD-10-CM | POA: Diagnosis not present

## 2011-12-20 DIAGNOSIS — M5126 Other intervertebral disc displacement, lumbar region: Secondary | ICD-10-CM | POA: Diagnosis not present

## 2011-12-20 DIAGNOSIS — M25579 Pain in unspecified ankle and joints of unspecified foot: Secondary | ICD-10-CM | POA: Diagnosis not present

## 2011-12-20 DIAGNOSIS — M1A00X0 Idiopathic chronic gout, unspecified site, without tophus (tophi): Secondary | ICD-10-CM | POA: Diagnosis not present

## 2011-12-21 LAB — GLUCOSE, CAPILLARY: Glucose-Capillary: 123 mg/dL — ABNORMAL HIGH (ref 70–99)

## 2011-12-22 LAB — GLUCOSE, CAPILLARY: Glucose-Capillary: 129 mg/dL — ABNORMAL HIGH (ref 70–99)

## 2011-12-22 NOTE — Progress Notes (Signed)
[facility]  NAMECATHY, [name] [name]                ACCOUNT NO.:  [id number]  MEDICAL RECORD NO.:  [id number]      DATE OF BIRTH:  [birth year]  PHYSICIAN:  Ardath Sax, M.D.           VISIT DATE:                                  OFFICE VISIT   We want to get permission for him to be able to return for another session for hyperbaric oxygen treatments as he has improved 60-70%, with the last treatment.  He has diabetic foot ulcers on his lateral right foot that have healed in considerably since we have been treating him with hyperbaric oxygen.  We have also put on a Dermagraft and we have had him on doxycycline for the infection around these ulcers.  The progress we made was also due to the fact that he had bypass to his right femoral artery, which has certainly helped significantly in the blood supply to his right lower extremity.  We will apply for another session of hyperbaric oxygen therapy, as he has been improved by the size of these diabetic foot ulcers, at least by 70%.     Ardath Sax, M.D.     PP/MEDQ  D:  12/22/2011  T:  12/22/2011  Job:  [id number]

## 2011-12-27 LAB — GLUCOSE, CAPILLARY
Glucose-Capillary: 116 mg/dL — ABNORMAL HIGH (ref 70–99)
Glucose-Capillary: 158 mg/dL — ABNORMAL HIGH (ref 70–99)
Glucose-Capillary: 137 mg/dL — ABNORMAL HIGH (ref 70–99)

## 2012-01-01 LAB — GLUCOSE, CAPILLARY: Glucose-Capillary: 146 mg/dL — ABNORMAL HIGH (ref 70–99)

## 2012-01-02 LAB — GLUCOSE, CAPILLARY
Glucose-Capillary: 153 mg/dL — ABNORMAL HIGH (ref 70–99)
Glucose-Capillary: 136 mg/dL — ABNORMAL HIGH (ref 70–99)

## 2012-01-04 ENCOUNTER — Encounter (HOSPITAL_BASED_OUTPATIENT_CLINIC_OR_DEPARTMENT_OTHER): Payer: Medicare (Managed Care)

## 2012-01-04 DIAGNOSIS — M545 Low back pain: Secondary | ICD-10-CM | POA: Diagnosis not present

## 2012-01-08 ENCOUNTER — Encounter (HOSPITAL_BASED_OUTPATIENT_CLINIC_OR_DEPARTMENT_OTHER): Payer: Medicare (Managed Care) | Attending: General Surgery

## 2012-01-08 ENCOUNTER — Encounter (HOSPITAL_BASED_OUTPATIENT_CLINIC_OR_DEPARTMENT_OTHER): Payer: Medicare (Managed Care)

## 2012-01-08 DIAGNOSIS — E1169 Type 2 diabetes mellitus with other specified complication: Secondary | ICD-10-CM | POA: Insufficient documentation

## 2012-01-08 DIAGNOSIS — L97509 Non-pressure chronic ulcer of other part of unspecified foot with unspecified severity: Secondary | ICD-10-CM | POA: Insufficient documentation

## 2012-01-08 LAB — GLUCOSE, CAPILLARY: Glucose-Capillary: 95 mg/dL (ref 70–99)

## 2012-01-09 LAB — GLUCOSE, CAPILLARY
Glucose-Capillary: 106 mg/dL — ABNORMAL HIGH (ref 70–99)
Glucose-Capillary: 94 mg/dL (ref 70–99)
Glucose-Capillary: 102 mg/dL — ABNORMAL HIGH (ref 70–99)

## 2012-01-11 LAB — GLUCOSE, CAPILLARY: Glucose-Capillary: 150 mg/dL — ABNORMAL HIGH (ref 70–99)

## 2012-01-12 LAB — GLUCOSE, CAPILLARY: Glucose-Capillary: 132 mg/dL — ABNORMAL HIGH (ref 70–99)

## 2012-01-15 LAB — GLUCOSE, CAPILLARY: Glucose-Capillary: 142 mg/dL — ABNORMAL HIGH (ref 70–99)

## 2012-01-17 DIAGNOSIS — L97509 Non-pressure chronic ulcer of other part of unspecified foot with unspecified severity: Secondary | ICD-10-CM | POA: Diagnosis not present

## 2012-01-17 DIAGNOSIS — L608 Other nail disorders: Secondary | ICD-10-CM | POA: Diagnosis not present

## 2012-01-17 DIAGNOSIS — E119 Type 2 diabetes mellitus without complications: Secondary | ICD-10-CM | POA: Diagnosis not present

## 2012-01-17 DIAGNOSIS — L84 Corns and callosities: Secondary | ICD-10-CM | POA: Diagnosis not present

## 2012-01-17 LAB — GLUCOSE, CAPILLARY
Glucose-Capillary: 103 mg/dL — ABNORMAL HIGH (ref 70–99)
Glucose-Capillary: 107 mg/dL — ABNORMAL HIGH (ref 70–99)
Glucose-Capillary: 104 mg/dL — ABNORMAL HIGH (ref 70–99)

## 2012-01-18 LAB — GLUCOSE, CAPILLARY: Glucose-Capillary: 94 mg/dL (ref 70–99)

## 2012-01-22 LAB — GLUCOSE, CAPILLARY
Glucose-Capillary: 115 mg/dL — ABNORMAL HIGH (ref 70–99)
Glucose-Capillary: 94 mg/dL (ref 70–99)
Glucose-Capillary: 135 mg/dL — ABNORMAL HIGH (ref 70–99)

## 2012-01-23 ENCOUNTER — Ambulatory Visit: Payer: Medicare (Managed Care) | Admitting: Family

## 2012-01-23 LAB — GLUCOSE, CAPILLARY
Glucose-Capillary: 144 mg/dL — ABNORMAL HIGH (ref 70–99)
Glucose-Capillary: 110 mg/dL — ABNORMAL HIGH (ref 70–99)

## 2012-01-24 LAB — GLUCOSE, CAPILLARY: Glucose-Capillary: 131 mg/dL — ABNORMAL HIGH (ref 70–99)

## 2012-01-26 ENCOUNTER — Ambulatory Visit: Payer: Medicare (Managed Care) | Admitting: Family

## 2012-01-26 DIAGNOSIS — Z0289 Encounter for other administrative examinations: Secondary | ICD-10-CM

## 2012-01-29 LAB — GLUCOSE, CAPILLARY: Glucose-Capillary: 97 mg/dL (ref 70–99)

## 2012-01-31 LAB — GLUCOSE, CAPILLARY
Glucose-Capillary: 102 mg/dL — ABNORMAL HIGH (ref 70–99)
Glucose-Capillary: 112 mg/dL — ABNORMAL HIGH (ref 70–99)
Glucose-Capillary: 128 mg/dL — ABNORMAL HIGH (ref 70–99)
Glucose-Capillary: 128 mg/dL — ABNORMAL HIGH (ref 70–99)

## 2012-02-01 LAB — GLUCOSE, CAPILLARY: Glucose-Capillary: 160 mg/dL — ABNORMAL HIGH (ref 70–99)

## 2012-02-02 LAB — GLUCOSE, CAPILLARY: Glucose-Capillary: 121 mg/dL — ABNORMAL HIGH (ref 70–99)

## 2012-02-06 ENCOUNTER — Encounter (HOSPITAL_BASED_OUTPATIENT_CLINIC_OR_DEPARTMENT_OTHER): Payer: Medicare (Managed Care) | Attending: General Surgery

## 2012-02-06 DIAGNOSIS — L97509 Non-pressure chronic ulcer of other part of unspecified foot with unspecified severity: Secondary | ICD-10-CM | POA: Insufficient documentation

## 2012-02-06 DIAGNOSIS — I10 Essential (primary) hypertension: Secondary | ICD-10-CM | POA: Insufficient documentation

## 2012-02-06 DIAGNOSIS — E1169 Type 2 diabetes mellitus with other specified complication: Secondary | ICD-10-CM | POA: Insufficient documentation

## 2012-02-06 DIAGNOSIS — L84 Corns and callosities: Secondary | ICD-10-CM | POA: Insufficient documentation

## 2012-02-06 DIAGNOSIS — X58XXXA Exposure to other specified factors, initial encounter: Secondary | ICD-10-CM | POA: Insufficient documentation

## 2012-02-06 DIAGNOSIS — L089 Local infection of the skin and subcutaneous tissue, unspecified: Secondary | ICD-10-CM | POA: Insufficient documentation

## 2012-02-07 LAB — GLUCOSE, CAPILLARY
Glucose-Capillary: 153 mg/dL — ABNORMAL HIGH (ref 70–99)
Glucose-Capillary: 158 mg/dL — ABNORMAL HIGH (ref 70–99)

## 2012-02-08 LAB — GLUCOSE, CAPILLARY
Glucose-Capillary: 137 mg/dL — ABNORMAL HIGH (ref 70–99)
Glucose-Capillary: 126 mg/dL — ABNORMAL HIGH (ref 70–99)

## 2012-02-09 LAB — GLUCOSE, CAPILLARY: Glucose-Capillary: 103 mg/dL — ABNORMAL HIGH (ref 70–99)

## 2012-02-12 LAB — GLUCOSE, CAPILLARY
Glucose-Capillary: 126 mg/dL — ABNORMAL HIGH (ref 70–99)
Glucose-Capillary: 123 mg/dL — ABNORMAL HIGH (ref 70–99)

## 2012-02-13 LAB — GLUCOSE, CAPILLARY
Glucose-Capillary: 120 mg/dL — ABNORMAL HIGH (ref 70–99)
Glucose-Capillary: 131 mg/dL — ABNORMAL HIGH (ref 70–99)

## 2012-02-14 LAB — GLUCOSE, CAPILLARY: Glucose-Capillary: 111 mg/dL — ABNORMAL HIGH (ref 70–99)

## 2012-02-16 LAB — GLUCOSE, CAPILLARY
Glucose-Capillary: 112 mg/dL — ABNORMAL HIGH (ref 70–99)
Glucose-Capillary: 132 mg/dL — ABNORMAL HIGH (ref 70–99)
Glucose-Capillary: 74 mg/dL (ref 70–99)
Glucose-Capillary: 81 mg/dL (ref 70–99)

## 2012-02-19 ENCOUNTER — Ambulatory Visit: Payer: Medicare (Managed Care) | Admitting: Family

## 2012-02-20 ENCOUNTER — Ambulatory Visit: Payer: Medicare (Managed Care) | Admitting: Family

## 2012-02-20 ENCOUNTER — Ambulatory Visit (INDEPENDENT_AMBULATORY_CARE_PROVIDER_SITE_OTHER): Payer: Medicare (Managed Care) | Admitting: Family

## 2012-02-20 ENCOUNTER — Encounter: Payer: Self-pay | Admitting: Family

## 2012-02-20 VITALS — BP 100/60 | HR 64 | Temp 98.1°F | Resp 16 | Ht 72.25 in | Wt 228.1 lb

## 2012-02-20 DIAGNOSIS — B1920 Unspecified viral hepatitis C without hepatic coma: Secondary | ICD-10-CM

## 2012-02-20 DIAGNOSIS — E785 Hyperlipidemia, unspecified: Secondary | ICD-10-CM

## 2012-02-20 DIAGNOSIS — F329 Major depressive disorder, single episode, unspecified: Secondary | ICD-10-CM

## 2012-02-20 DIAGNOSIS — E119 Type 2 diabetes mellitus without complications: Secondary | ICD-10-CM

## 2012-02-20 DIAGNOSIS — G8929 Other chronic pain: Secondary | ICD-10-CM

## 2012-02-20 DIAGNOSIS — G629 Polyneuropathy, unspecified: Secondary | ICD-10-CM

## 2012-02-20 DIAGNOSIS — L97909 Non-pressure chronic ulcer of unspecified part of unspecified lower leg with unspecified severity: Secondary | ICD-10-CM

## 2012-02-20 DIAGNOSIS — Z23 Encounter for immunization: Secondary | ICD-10-CM

## 2012-02-20 DIAGNOSIS — M545 Low back pain: Secondary | ICD-10-CM

## 2012-02-20 DIAGNOSIS — I10 Essential (primary) hypertension: Secondary | ICD-10-CM

## 2012-02-20 DIAGNOSIS — G589 Mononeuropathy, unspecified: Secondary | ICD-10-CM

## 2012-02-20 LAB — GLUCOSE, CAPILLARY: Glucose-Capillary: 156 mg/dL — ABNORMAL HIGH (ref 70–99)

## 2012-02-20 MED ORDER — PREGABALIN 300 MG PO CAPS: 300.0000 mg | ORAL_CAPSULE | Freq: Two times a day (BID) | ORAL | Status: DC

## 2012-02-20 MED ORDER — INDOMETHACIN 25 MG PO CAPS: 25.0000 mg | ORAL_CAPSULE | Freq: Two times a day (BID) | ORAL | Status: DC

## 2012-02-20 NOTE — Patient Instructions (Addendum)
Please complete your lab work prior to leaving.  Follow up in 2 months. Welcome to [facility]!

## 2012-02-20 NOTE — Progress Notes (Signed)
Subjective:    Patient ID: [name] [name], male    DOB: [birth year], 61 y.o.   MRN: [id number]  HPI  Mr.  [name] is a 61 yr old male who presents today to establish care. Moved back from [place].  Reports hx of hep C- underwent interferon therapy- stopped March 11th.  Now undetectable. Was seeing GI in [place].   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 Reports auditory/visual hallucinations.  No psychiatrist recently. Reports that he has tried risperdol and prozac- no improvement.  Gout- uses colchicine-  Rare gout symptoms.    Ankle wound left lateral foot. Reports that it is now healed.  Dr. Jimmey Ralph is following him.    HTN- reports that doctor at [facility]. Reports that when hctz component was discontinued he had lower extremity swelling.   HA's-  Occurs few times a month.  Goes away on own.   Hyperipidemia- on pravastatin.  He reports hx of blood clot left groin.  This was diagnosed in the 1980's and treated with coumadin.   He reports DDD back which he tells me is primary cause of his neuropathy in his right foot.    Pt sees Dr. Mirian Capuchin for pain management  DM2- Pt is on metformin.      Review of Systems See HPI  Past Medical History  Diagnosis Date  . Vein, varicose   . Hyperlipidemia   . Varicose veins   . Complication of anesthesia     difficulty waking up  . Hypertension     Takes prinzide daily  . Diabetes mellitus     not consistent on taking medications  . Peripheral vascular disease   . Chronic kidney disease     acute renal failure in February admission  . Anemia     hx of blood transfusion  . Wound check, dressing change     dressing changes 3x/week; open wound  . Depression   . History of headache   . History of hepatitis C     competed treatment  . Neuromuscular disorder     gout and neuropathy  . History of blood clots 1986 or 87    blood clot in groin?    History   Social History  . Marital Status: Single    Spouse Name: N/A   Number of Children: 5  . Years of Education: N/A   Occupational History  . Not on file.   Social History Main Topics  . Smoking status: Former Smoker -- 1.0 packs/day for 35 years    Types: Cigars    Quit date: 11/12/2011  . Smokeless tobacco: Never Used  . Alcohol Use: No  . Drug Use: No  . Sexually Active: Not on file   Other Topics Concern  . 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    Past Surgical History  Procedure Date  . Varicose vein surgery   . Hemorrhoid surgery   . Vascular surgery 1980s    left leg  . Femoral-popliteal bypass graft 11/24/2011    Procedure: BYPASS GRAFT FEMORAL-POPLITEAL ARTERY;  Surgeon: Larina Earthly, MD;  [facility]: [facility];  Service: Vascular;  Laterality: Right;    Family History  Problem Relation Age of Onset  . Cancer Mother     liver?  Marland Kitchen Hyperlipidemia Father   .  Hypertension Father   . Diabetes Father   . Alcohol abuse Father   . Arthritis Father   . Kidney disease Father     Allergies  Allergen Reactions  . Pork-Derived Products     Current Outpatient Prescriptions on File Prior to Visit  Medication Sig Dispense Refill  . amitriptyline (ELAVIL) 25 MG tablet Take 25 mg by mouth 3 (three) times daily as needed.      . cephALEXin (KEFLEX) 500 MG capsule Take 1,000 mg by mouth 2 (two) times daily.      . colchicine 0.6 MG tablet Take 0.6 mg by mouth daily.      . collagenase (SANTYL) ointment Apply 1 application topically 2 (two) times daily.      Marland Kitchen doxycycline (VIBRA-TABS) 100 MG tablet Take 100 mg by mouth 2 (two) times daily.      . Ferrous Sulfate (IRON) 325 (65 FE) MG TABS Take 1 tablet by mouth daily.      Marland Kitchen gabapentin (NEURONTIN) 800 MG tablet Take 800 mg by mouth 2 (two) times daily with a meal.        . lisinopril-hydrochlorothiazide (PRINZIDE,ZESTORETIC) 20-12.5 MG per tablet Take 1 tablet by mouth daily.      . metFORMIN (GLUCOPHAGE) 500 MG tablet Take 500 mg by mouth 2 (two) times daily with a meal.      . Oxycodone HCl 20 MG TABS Take 1 tablet (20 mg total) by mouth 4 (four) times daily as needed (for pain). Take 1/2 - 1 tablet (10-20mg ) by mouth 2-4 times daily as needed for pain  30 tablet  0  . pravastatin (PRAVACHOL) 20 MG tablet Take 20 mg by mouth daily.      . vitamin B-12 (CYANOCOBALAMIN) 1000 MCG tablet Take 1,000 mcg by mouth daily.        BP 100/60  Pulse 64  Temp 98.1 F (36.7 C) (Oral)  Resp 16  Ht 6' 0.25" (1.835 m)  Wt 228 lb 1.3 oz (103.456 kg)  BMI 30.72 kg/m2  SpO2 96%       Objective:   Physical Exam  Constitutional: He is oriented to person, place, and time. He appears well-developed and well-nourished. No distress.  HENT:  Head: Normocephalic and atraumatic.  Right Ear: Tympanic membrane and ear canal normal.  Left Ear: Tympanic membrane and ear canal normal.  Cardiovascular: Normal rate and regular rhythm.   No murmur heard. Pulmonary/Chest: Effort normal and breath sounds normal. No respiratory distress. He has no wheezes. He has no rales. He exhibits no tenderness.  Musculoskeletal: He exhibits no edema.  Neurological: He is alert and oriented to person, place, and time.  Skin: Skin is warm and dry.       Healed wound near left lateral ankle. Feet are dry and peeling.   Psychiatric: He has a normal mood and affect. His behavior is normal. Thought content normal.          Assessment & Plan:  Will request records from his previous provide.  30 minutes spent with the patient today.  >50% of this time was spent counseling pt on his multiple medical issues.

## 2012-02-21 ENCOUNTER — Encounter: Payer: Self-pay | Admitting: Family

## 2012-02-21 LAB — GLUCOSE, CAPILLARY: Glucose-Capillary: 126 mg/dL — ABNORMAL HIGH (ref 70–99)

## 2012-02-21 LAB — BASIC METABOLIC PANEL WITH GFR
BUN: 15 mg/dL (ref 6–23)
Calcium: 9.4 mg/dL (ref 8.4–10.5)
GFR, Est African American: 71 mL/min
GFR, Est Non African American: 61 mL/min
Potassium: 4.1 mEq/L (ref 3.5–5.3)

## 2012-02-21 LAB — HEPATIC FUNCTION PANEL
AST: 13 U/L (ref 0–37)
Albumin: 4.4 g/dL (ref 3.5–5.2)
Total Bilirubin: 0.4 mg/dL (ref 0.3–1.2)
Total Protein: 7.5 g/dL (ref 6.0–8.3)

## 2012-02-21 LAB — MICROALBUMIN / CREATININE URINE RATIO: Microalb, Ur: 0.84 mg/dL (ref 0.00–1.89)

## 2012-02-22 DIAGNOSIS — E119 Type 2 diabetes mellitus without complications: Secondary | ICD-10-CM | POA: Insufficient documentation

## 2012-02-22 DIAGNOSIS — F329 Major depressive disorder, single episode, unspecified: Secondary | ICD-10-CM | POA: Insufficient documentation

## 2012-02-22 DIAGNOSIS — B1920 Unspecified viral hepatitis C without hepatic coma: Secondary | ICD-10-CM | POA: Insufficient documentation

## 2012-02-22 DIAGNOSIS — E7800 Pure hypercholesterolemia, unspecified: Secondary | ICD-10-CM | POA: Insufficient documentation

## 2012-02-22 DIAGNOSIS — E785 Hyperlipidemia, unspecified: Secondary | ICD-10-CM | POA: Insufficient documentation

## 2012-02-22 DIAGNOSIS — G8929 Other chronic pain: Secondary | ICD-10-CM | POA: Insufficient documentation

## 2012-02-22 DIAGNOSIS — L97509 Non-pressure chronic ulcer of other part of unspecified foot with unspecified severity: Secondary | ICD-10-CM | POA: Insufficient documentation

## 2012-02-22 DIAGNOSIS — G629 Polyneuropathy, unspecified: Secondary | ICD-10-CM | POA: Insufficient documentation

## 2012-02-22 DIAGNOSIS — M545 Low back pain, unspecified: Secondary | ICD-10-CM | POA: Insufficient documentation

## 2012-02-22 NOTE — Assessment & Plan Note (Signed)
Resolved.  He continues to follow at the wound care center.

## 2012-02-22 NOTE — Assessment & Plan Note (Addendum)
Clinically stable on metformin.  Obtain bmet. UA microalbumin.

## 2012-02-22 NOTE — Assessment & Plan Note (Signed)
On statin.  Plan flp next visit.

## 2012-02-22 NOTE — Assessment & Plan Note (Signed)
Also- PTSD from prison stays. Reports auditory/visual hallucinations. Denies SI.  Will refer to psychiatry.

## 2012-02-22 NOTE — Assessment & Plan Note (Signed)
BP Readings from Last 3 Encounters:  02/20/12 100/60  11/29/11 108/66  11/29/11 108/66   BP appears well controlled on lisinopril hctz- continue same.

## 2012-02-22 NOTE — Assessment & Plan Note (Signed)
Per patient, last viral load check undetectable.

## 2012-02-22 NOTE — Assessment & Plan Note (Signed)
He is treating this with lyrica.  He is requesting rx for this as he tells me his pain management doctor is only managing oxycodone.

## 2012-02-22 NOTE — Assessment & Plan Note (Addendum)
Pt reports hx of DDD.  Pain is treated by oxycodone. He is followed by pain management.

## 2012-02-23 LAB — GLUCOSE, CAPILLARY: Glucose-Capillary: 132 mg/dL — ABNORMAL HIGH (ref 70–99)

## 2012-02-27 LAB — GLUCOSE, CAPILLARY
Glucose-Capillary: 106 mg/dL — ABNORMAL HIGH (ref 70–99)
Glucose-Capillary: 102 mg/dL — ABNORMAL HIGH (ref 70–99)
Glucose-Capillary: 119 mg/dL — ABNORMAL HIGH (ref 70–99)

## 2012-02-28 LAB — GLUCOSE, CAPILLARY
Glucose-Capillary: 130 mg/dL — ABNORMAL HIGH (ref 70–99)
Glucose-Capillary: 100 mg/dL — ABNORMAL HIGH (ref 70–99)

## 2012-03-05 ENCOUNTER — Encounter (HOSPITAL_BASED_OUTPATIENT_CLINIC_OR_DEPARTMENT_OTHER): Payer: Medicare (Managed Care) | Attending: General Surgery

## 2012-03-05 DIAGNOSIS — L97509 Non-pressure chronic ulcer of other part of unspecified foot with unspecified severity: Secondary | ICD-10-CM | POA: Insufficient documentation

## 2012-03-05 DIAGNOSIS — X58XXXA Exposure to other specified factors, initial encounter: Secondary | ICD-10-CM | POA: Insufficient documentation

## 2012-03-05 DIAGNOSIS — L97309 Non-pressure chronic ulcer of unspecified ankle with unspecified severity: Secondary | ICD-10-CM | POA: Insufficient documentation

## 2012-03-05 DIAGNOSIS — E1169 Type 2 diabetes mellitus with other specified complication: Secondary | ICD-10-CM | POA: Insufficient documentation

## 2012-03-05 LAB — GLUCOSE, CAPILLARY: Glucose-Capillary: 146 mg/dL — ABNORMAL HIGH (ref 70–99)

## 2012-03-07 LAB — GLUCOSE, CAPILLARY
Glucose-Capillary: 128 mg/dL — ABNORMAL HIGH (ref 70–99)
Glucose-Capillary: 123 mg/dL — ABNORMAL HIGH (ref 70–99)

## 2012-03-08 LAB — GLUCOSE, CAPILLARY: Glucose-Capillary: 135 mg/dL — ABNORMAL HIGH (ref 70–99)

## 2012-03-12 LAB — GLUCOSE, CAPILLARY: Glucose-Capillary: 112 mg/dL — ABNORMAL HIGH (ref 70–99)

## 2012-03-13 ENCOUNTER — Encounter: Payer: Self-pay | Admitting: Family

## 2012-03-13 ENCOUNTER — Telehealth: Payer: Self-pay | Admitting: *Deleted

## 2012-03-13 NOTE — Telephone Encounter (Signed)
See letter.

## 2012-03-13 NOTE — Telephone Encounter (Signed)
Pt left message requesting letter documenting his back condition and his inability to climb stairs. He needs it for the [employer]. Please call pt when ready.  Please advise.

## 2012-03-14 NOTE — Telephone Encounter (Signed)
Notified pt, left letter at front desk for pick up.

## 2012-03-21 ENCOUNTER — Ambulatory Visit: Payer: Medicare (Managed Care) | Admitting: Licensed Clinical Social Worker

## 2012-03-22 DIAGNOSIS — E119 Type 2 diabetes mellitus without complications: Secondary | ICD-10-CM | POA: Diagnosis not present

## 2012-03-22 DIAGNOSIS — L84 Corns and callosities: Secondary | ICD-10-CM | POA: Diagnosis not present

## 2012-03-22 DIAGNOSIS — L608 Other nail disorders: Secondary | ICD-10-CM | POA: Diagnosis not present

## 2012-03-22 DIAGNOSIS — L97509 Non-pressure chronic ulcer of other part of unspecified foot with unspecified severity: Secondary | ICD-10-CM | POA: Diagnosis not present

## 2012-03-26 ENCOUNTER — Telehealth: Payer: Self-pay | Admitting: Family

## 2012-03-26 NOTE — Telephone Encounter (Signed)
Received medical records request from [facility].   Faxed requested records on 03/26/12  P: [phone] F: [phone]

## 2012-04-08 ENCOUNTER — Other Ambulatory Visit: Payer: Self-pay | Admitting: Family

## 2012-04-12 ENCOUNTER — Telehealth: Payer: Self-pay | Admitting: *Deleted

## 2012-04-12 NOTE — Telephone Encounter (Signed)
Received faxes from [facility] requesting orders be signed for a back brace and heating pad. Confirmed with pt that he did request these products to help with his chronic back pain. Forms request that indication of need be documented in patient's chart.  Forms forwarded to Provider for completion / signature. Please advise.

## 2012-04-17 ENCOUNTER — Telehealth: Payer: Self-pay | Admitting: Family

## 2012-04-17 NOTE — Telephone Encounter (Signed)
Per verbal from Provider, last office note indicated pt was following with wound care center and Dr Jimmey Ralph.  Pt should contact his wound care doctor for further recommendations. Attempted to reach pt and left message on home # to return my call tomorrow.

## 2012-04-17 NOTE — Telephone Encounter (Signed)
Received message from [place] at [facility] checking the status of orders that were previously sent.  Please advise.

## 2012-04-17 NOTE — Telephone Encounter (Signed)
Says the wound on his foot is more painful and wonders if he needs an ultasound

## 2012-04-18 ENCOUNTER — Telehealth: Payer: Self-pay | Admitting: *Deleted

## 2012-04-18 NOTE — Telephone Encounter (Signed)
Spoke with pt, he states he does not currently have a wound. 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 Scheduled pt appt with [place] for Friday at 2:15pm.

## 2012-04-18 NOTE — Telephone Encounter (Signed)
I signed.  Needs Dr. Rodena Medin cosig please.

## 2012-04-18 NOTE — Telephone Encounter (Signed)
Received parking placard application for permanent permit. Application forwarded to Provider for completion.

## 2012-04-19 ENCOUNTER — Ambulatory Visit (INDEPENDENT_AMBULATORY_CARE_PROVIDER_SITE_OTHER): Payer: Medicare (Managed Care) | Admitting: Family

## 2012-04-19 ENCOUNTER — Encounter: Payer: Self-pay | Admitting: Family

## 2012-04-19 VITALS — BP 122/84 | HR 81 | Temp 98.6°F | Resp 16 | Ht 72.25 in | Wt 243.0 lb

## 2012-04-19 DIAGNOSIS — M79609 Pain in unspecified limb: Secondary | ICD-10-CM

## 2012-04-19 DIAGNOSIS — M79604 Pain in right leg: Secondary | ICD-10-CM

## 2012-04-19 DIAGNOSIS — L97909 Non-pressure chronic ulcer of unspecified part of unspecified lower leg with unspecified severity: Secondary | ICD-10-CM

## 2012-04-19 MED ORDER — METFORMIN HCL 500 MG PO TABS: 500.0000 mg | ORAL_TABLET | Freq: Two times a day (BID) | ORAL | Status: DC

## 2012-04-19 MED ORDER — PREGABALIN 300 MG PO CAPS: 300.0000 mg | ORAL_CAPSULE | Freq: Two times a day (BID) | ORAL | Status: DC

## 2012-04-19 NOTE — Telephone Encounter (Signed)
Will discuss at his apt today.

## 2012-04-19 NOTE — Progress Notes (Signed)
Subjective:    Patient ID: [name] [name], male    DOB: June 21, 1950, 61 y.o.   MRN: [id number]  HPI  [name] [name] is a 61 yr old male who presents today with chief complaint of right foot pain.   Pt reports that he was last seen at the wound clinic 1 month ago. Reports that wound clinic signed off as his wound was healed.  He has He now has some scabbing on the right outer foot at the site of the healed wound.  Showed this to Dr. Payton Emerald 1 month ago and reports that it is unchanged since that time.   Review of Systems See HPI  Past Medical History  Diagnosis Date  . Vein, varicose   . Hyperlipidemia   . Varicose veins   . Complication of anesthesia     difficulty waking up  . Hypertension     Takes prinzide daily  . Diabetes mellitus     not consistent on taking medications  . Peripheral vascular disease   . Chronic kidney disease     acute renal failure in February admission  . Anemia     hx of blood transfusion  . Wound check, dressing change     dressing changes 3x/week; open wound  . Depression   . History of headache   . History of hepatitis C     competed treatment  . Neuromuscular disorder     gout and neuropathy  . History of blood clots 1986 or 87    blood clot in groin?    History   Social History  . Marital Status: Single    Spouse Name: N/A    Number of Children: 5  . Years of Education: N/A   Occupational History  . Not on file.   Social History Main Topics  . Smoking status: Former Smoker -- 1.0 packs/day for 35 years    Types: Cigars    Quit date: 11/12/2011  . Smokeless tobacco: Never Used  . Alcohol Use: No  . Drug Use: No  . Sexually Active: Not on file   Other Topics Concern  . Not on file   Social History Narrative   Regular exercise:  Walks a little every day.Lives with 26 yr old son.Associated degree social servicesWorking on BSN psychology.Quit smoking few months ago.Goes to [employer]- drug free since 1994.Former Etoh  abuse.    Past Surgical History  Procedure Date  . Varicose vein surgery   . Hemorrhoid surgery   . Vascular surgery 1980s    left leg  . Femoral-popliteal bypass graft 11/24/2011    Procedure: BYPASS GRAFT FEMORAL-POPLITEAL ARTERY;  Surgeon: Larina Earthly, MD;  Location: [facility] OR;  Service: Vascular;  Laterality: Right;    Family History  Problem Relation Age of Onset  . Cancer Mother     liver?  Marland Kitchen Hyperlipidemia Father   . Hypertension Father   . Diabetes Father   . Alcohol abuse Father   . Arthritis Father   . Kidney disease Father     Allergies  Allergen Reactions  . Pork-Derived Products     Current Outpatient Prescriptions on File Prior to Visit  Medication Sig Dispense Refill  . amitriptyline (ELAVIL) 25 MG tablet Take 25 mg by mouth 3 (three) times daily as needed.      . cephALEXin (KEFLEX) 500 MG capsule Take 1,000 mg by mouth 2 (two) times daily.      . colchicine 0.6 MG tablet Take 0.6 mg  by mouth daily.      . Ferrous Sulfate (IRON) 325 (65 FE) MG TABS Take 1 tablet by mouth daily.      Marland Kitchen gabapentin (NEURONTIN) 800 MG tablet Take 800 mg by mouth 2 (two) times daily with a meal.      . indomethacin (INDOCIN) 25 MG capsule Take 1 capsule (25 mg total) by mouth 2 (two) times daily with a meal.  60 capsule  1  . lisinopril-hydrochlorothiazide (PRINZIDE,ZESTORETIC) 20-12.5 MG per tablet Take 1 tablet by mouth daily.      . metFORMIN (GLUCOPHAGE) 500 MG tablet Take 1 tablet (500 mg total) by mouth 2 (two) times daily with a meal.  60 tablet  2  . Oxycodone HCl 20 MG TABS Take 1 tablet (20 mg total) by mouth 4 (four) times daily as needed (for pain). Take 1/2 - 1 tablet (10-20mg ) by mouth 2-4 times daily as needed for pain  30 tablet  0  . pravastatin (PRAVACHOL) 20 MG tablet TAKE 1 TABLET BY MOUTH AT BEDTIME  90 tablet  0  . vitamin B-12 (CYANOCOBALAMIN) 1000 MCG tablet Take 1,000 mcg by mouth daily.      . collagenase (SANTYL) ointment Apply 1 application topically 2  (two) times daily.      Marland Kitchen doxycycline (VIBRA-TABS) 100 MG tablet Take 100 mg by mouth 2 (two) times daily.        BP 122/84  Pulse 81  Temp 98.6 F (37 C) (Oral)  Resp 16  Ht 6' 0.25" (1.835 m)  Wt 243 lb (110.224 kg)  BMI 32.73 kg/m2  SpO2 97%       Objective:   Physical Exam  Constitutional: He appears well-developed and well-nourished. No distress.  Cardiovascular: Normal rate and regular rhythm.   No murmur heard. Pulses:      Dorsalis pedis pulses are 2+ on the right side, and 2+ on the left side.       Posterior tibial pulses are 2+ on the right side, and 2+ on the left side.  Pulmonary/Chest: Effort normal and breath sounds normal. No respiratory distress. He has no wheezes. He has no rales. He exhibits no tenderness.  Musculoskeletal: He exhibits no edema.  Skin: Skin is warm and dry.       Dry, raised scar tissue noted right lateral foot.  Area is mildly tender to palpation.  No ulceration, no fluctuance no erythema is noted.   Psychiatric: He has a normal mood and affect. His behavior is normal. Judgment and thought content normal.          Assessment & Plan:

## 2012-04-19 NOTE — Patient Instructions (Addendum)
You will be contact about your referral for lower extremity ABI test.  Please let us know if you have not heard back within 1 week about scheduling your test. Call us if you develop increased pain, redness, drainage or if you develop an open wound on your foot.  Follow up the week prior to christmas (after 12/17).

## 2012-04-19 NOTE — Telephone Encounter (Signed)
Completed form copied and sent for scanning. Original will be given to pt today at appointment.

## 2012-04-19 NOTE — Telephone Encounter (Signed)
Completed form given to pt.

## 2012-04-19 NOTE — Telephone Encounter (Signed)
Signed.

## 2012-04-22 NOTE — Telephone Encounter (Signed)
Order faxed to [facility] on 04/19/12 and 04/22/12 @ [phone].

## 2012-04-24 ENCOUNTER — Ambulatory Visit: Payer: Medicare (Managed Care) | Admitting: Family

## 2012-04-26 NOTE — Assessment & Plan Note (Signed)
I think that the area of concern is scar tissue which has been rubbing up against the side of his shoe and becoming sore.  No sign of infection. Will obtain ABI to confirm adequate blood flow.

## 2012-04-29 DIAGNOSIS — R0989 Other specified symptoms and signs involving the circulatory and respiratory systems: Secondary | ICD-10-CM

## 2012-04-30 ENCOUNTER — Ambulatory Visit: Payer: Self-pay | Admitting: Licensed Clinical Social Worker

## 2012-04-30 ENCOUNTER — Telehealth: Payer: Self-pay | Admitting: Family

## 2012-04-30 DIAGNOSIS — F329 Major depressive disorder, single episode, unspecified: Secondary | ICD-10-CM

## 2012-04-30 NOTE — Telephone Encounter (Signed)
Patient called stating he wants a referral to a different counseling and heart doctor than the ones we referred him to.

## 2012-04-30 NOTE — Telephone Encounter (Signed)
Pt states every time he goes to see Mrs. Environmental consultant (counselor at [place] office) there seems to be a problem with his appt date / time. He states he has made a few attempts to see Mrs. Marlana Salvage and is not happy with the "run around" he feels he is getting.  Please advise.

## 2012-04-30 NOTE — Telephone Encounter (Signed)
I will request a referral to a different therapist.  We did not refer him to cardiology, but did refer him for Ultrasound of his legs to check is circulation which was scheduled for yesterday.  Please call cardiology to reschedule if pt is agreeable.

## 2012-05-01 NOTE — Telephone Encounter (Signed)
U/S rescheduled for 05/06/12 @ 4pm at [address]. Attempted to reach pt and left message to return my call.

## 2012-05-01 NOTE — Telephone Encounter (Signed)
Notified pt of instructions below. He would like to r/s ultrasound. Advised pt I will call him later today once the appt has been r/s.

## 2012-05-08 ENCOUNTER — Telehealth: Payer: Self-pay | Admitting: Family

## 2012-05-08 NOTE — Telephone Encounter (Signed)
Confirmed with pt that he has requested external vacuum Device for ED.  Form signed.

## 2012-05-14 ENCOUNTER — Encounter (INDEPENDENT_AMBULATORY_CARE_PROVIDER_SITE_OTHER): Payer: Medicare (Managed Care)

## 2012-05-14 DIAGNOSIS — M79604 Pain in right leg: Secondary | ICD-10-CM

## 2012-05-14 DIAGNOSIS — I739 Peripheral vascular disease, unspecified: Secondary | ICD-10-CM

## 2012-05-16 ENCOUNTER — Ambulatory Visit (INDEPENDENT_AMBULATORY_CARE_PROVIDER_SITE_OTHER): Payer: Medicare (Managed Care) | Admitting: Licensed Clinical Social Worker

## 2012-05-16 DIAGNOSIS — F331 Major depressive disorder, recurrent, moderate: Secondary | ICD-10-CM

## 2012-05-20 ENCOUNTER — Telehealth: Payer: Self-pay | Admitting: *Deleted

## 2012-05-20 DIAGNOSIS — M79671 Pain in right foot: Secondary | ICD-10-CM

## 2012-05-20 NOTE — Telephone Encounter (Signed)
Message copied by Kathi Simpers on Mon May 20, 2012  6:11 PM ------      Message from: O'SULLIVAN, MELISSA      Created: Fri May 17, 2012  1:06 PM       Pls let pt know that blood flow in his legs appears normal per his study.

## 2012-05-20 NOTE — Telephone Encounter (Signed)
Notified pt and he would like to know which direction he should go to determine the cause of pain in his foot? He states it hurts worse now than before he was going into the hyperbaric chamber.  Please advise.

## 2012-05-21 NOTE — Telephone Encounter (Signed)
I would like him to complete an x-ray of his foot to exclude any infection in the bone.

## 2012-05-23 ENCOUNTER — Ambulatory Visit (HOSPITAL_BASED_OUTPATIENT_CLINIC_OR_DEPARTMENT_OTHER)
Admission: RE | Admit: 2012-05-23 | Discharge: 2012-05-23 | Disposition: A | Discharge status: Home / Self Care | Payer: PRIVATE HEALTH INSURANCE | Source: Ambulatory Visit | Attending: Family | Admitting: Family

## 2012-05-23 ENCOUNTER — Telehealth: Payer: Self-pay | Admitting: Family

## 2012-05-23 DIAGNOSIS — I10 Essential (primary) hypertension: Secondary | ICD-10-CM

## 2012-05-23 DIAGNOSIS — M899 Disorder of bone, unspecified: Secondary | ICD-10-CM | POA: Insufficient documentation

## 2012-05-23 DIAGNOSIS — M2010 Hallux valgus (acquired), unspecified foot: Secondary | ICD-10-CM | POA: Insufficient documentation

## 2012-05-23 DIAGNOSIS — E119 Type 2 diabetes mellitus without complications: Secondary | ICD-10-CM

## 2012-05-23 DIAGNOSIS — L97509 Non-pressure chronic ulcer of other part of unspecified foot with unspecified severity: Secondary | ICD-10-CM | POA: Insufficient documentation

## 2012-05-23 DIAGNOSIS — M79671 Pain in right foot: Secondary | ICD-10-CM

## 2012-05-23 NOTE — Telephone Encounter (Signed)
Patient is requesting a referral to an Optometrist/Opthamologist for routine vision check.

## 2012-05-23 NOTE — Telephone Encounter (Signed)
Spoke with pt. And he states he has not seen an eye doctor in this area before.  Please advise.

## 2012-05-23 NOTE — Telephone Encounter (Signed)
Left message on home/cell# to return my call. 

## 2012-05-23 NOTE — Telephone Encounter (Signed)
Notified pt and he will return for foot xray today.

## 2012-05-24 ENCOUNTER — Telehealth: Payer: Self-pay | Admitting: *Deleted

## 2012-05-24 DIAGNOSIS — M79673 Pain in unspecified foot: Secondary | ICD-10-CM

## 2012-05-24 NOTE — Telephone Encounter (Signed)
Message copied by Kathi Simpers on Fri May 24, 2012  4:11 PM ------      Message from: O'SULLIVAN, MELISSA      Created: Fri May 24, 2012  1:06 PM       Pls let pt know that X-ray shows arthritis changes.  Possibly due to Gout.  I would like him to return to lab for uric acid level pls.  Diagnosis is foot pain.

## 2012-05-24 NOTE — Telephone Encounter (Signed)
Referral has been placed. 

## 2012-05-24 NOTE — Telephone Encounter (Signed)
Notified pt of results below. Pt will return to the lab on Monday. Future order entered and sent to the lab.

## 2012-06-04 ENCOUNTER — Other Ambulatory Visit: Payer: Self-pay | Admitting: Family

## 2012-06-10 ENCOUNTER — Telehealth: Payer: Self-pay | Admitting: Family

## 2012-06-10 NOTE — Telephone Encounter (Signed)
Refill- lisinopril hctz 20-12.5. Two tablets everyday.

## 2012-06-10 NOTE — Telephone Encounter (Signed)
Request below is inconsistent with directions on current med list (1 tablet daily). Attempted to reach pt to verify directions and left message to return my call.

## 2012-06-11 MED ORDER — LISINOPRIL-HYDROCHLOROTHIAZIDE 20-12.5 MG PO TABS: 1.0000 | ORAL_TABLET | Freq: Two times a day (BID) | ORAL | Status: DC

## 2012-06-11 NOTE — Telephone Encounter (Signed)
Spoke with pt. He states that he takes 1 tablet twice a day. Correction made to medication list and refill sent to pharmacy.

## 2012-06-13 ENCOUNTER — Ambulatory Visit: Payer: PRIVATE HEALTH INSURANCE | Admitting: Licensed Clinical Social Worker

## 2012-07-04 ENCOUNTER — Other Ambulatory Visit: Payer: Self-pay | Admitting: Family

## 2012-07-04 NOTE — Telephone Encounter (Signed)
Refill sent to pharmacy on pt's metformin. Pt is due for follow up with Melissa. Please call pt to arrange appt.

## 2012-07-05 NOTE — Telephone Encounter (Signed)
Could not leave message, mailbox full. Will try again later °

## 2012-07-09 NOTE — Telephone Encounter (Signed)
Could not leave message, mailbox full. Will try again later °

## 2012-07-10 NOTE — Telephone Encounter (Signed)
Could not leave message, mailbox full

## 2012-07-10 NOTE — Telephone Encounter (Signed)
Letter mailed to pt to arrange appointment.

## 2012-09-17 ENCOUNTER — Telehealth: Payer: Self-pay | Admitting: *Deleted

## 2012-09-17 MED ORDER — GABAPENTIN 800 MG PO TABS: 800.0000 mg | ORAL_TABLET | Freq: Two times a day (BID) | ORAL | Status: DC

## 2012-09-17 NOTE — Telephone Encounter (Signed)
Rx request to pharmacy 30-day supply; pt informed & OV scheduled for Mon. 04.21.14 at 3:15pm/SLS

## 2012-09-17 NOTE — Telephone Encounter (Signed)
Pt request Gabapentin 800 mg refill, states "out of medicine", to [facility]; pt last OV 11.15.13, due back week of 12.17.13, no OV scheduled. Medication listed as Hx entry & no history of refill showing in EMR/SLS Please advise.

## 2012-09-17 NOTE — Telephone Encounter (Signed)
OK to send #60 tabs no refill. Needs OV please.

## 2012-09-23 ENCOUNTER — Ambulatory Visit (INDEPENDENT_AMBULATORY_CARE_PROVIDER_SITE_OTHER): Payer: PRIVATE HEALTH INSURANCE | Admitting: Family

## 2012-09-23 ENCOUNTER — Encounter: Payer: Self-pay | Admitting: Family

## 2012-09-23 VITALS — BP 104/70 | HR 86 | Temp 98.6°F | Resp 16 | Ht 72.25 in | Wt 280.0 lb

## 2012-09-23 DIAGNOSIS — I10 Essential (primary) hypertension: Secondary | ICD-10-CM

## 2012-09-23 DIAGNOSIS — B1920 Unspecified viral hepatitis C without hepatic coma: Secondary | ICD-10-CM

## 2012-09-23 DIAGNOSIS — E1149 Type 2 diabetes mellitus with other diabetic neurological complication: Secondary | ICD-10-CM

## 2012-09-23 DIAGNOSIS — Z8619 Personal history of other infectious and parasitic diseases: Secondary | ICD-10-CM

## 2012-09-23 DIAGNOSIS — E785 Hyperlipidemia, unspecified: Secondary | ICD-10-CM

## 2012-09-23 DIAGNOSIS — G589 Mononeuropathy, unspecified: Secondary | ICD-10-CM

## 2012-09-23 DIAGNOSIS — G629 Polyneuropathy, unspecified: Secondary | ICD-10-CM

## 2012-09-23 DIAGNOSIS — F329 Major depressive disorder, single episode, unspecified: Secondary | ICD-10-CM

## 2012-09-23 DIAGNOSIS — E119 Type 2 diabetes mellitus without complications: Secondary | ICD-10-CM

## 2012-09-23 DIAGNOSIS — R635 Abnormal weight gain: Secondary | ICD-10-CM

## 2012-09-23 MED ORDER — PREGABALIN 300 MG PO CAPS: ORAL_CAPSULE | ORAL | Status: DC

## 2012-09-23 MED ORDER — INDOMETHACIN 25 MG PO CAPS: 25.0000 mg | ORAL_CAPSULE | Freq: Two times a day (BID) | ORAL | Status: DC

## 2012-09-23 MED ORDER — PRAVASTATIN SODIUM 20 MG PO TABS: ORAL_TABLET | ORAL | Status: DC

## 2012-09-23 MED ORDER — GABAPENTIN 800 MG PO TABS: 800.0000 mg | ORAL_TABLET | Freq: Two times a day (BID) | ORAL | Status: DC

## 2012-09-23 MED ORDER — AMITRIPTYLINE HCL 25 MG PO TABS: 25.0000 mg | ORAL_TABLET | Freq: Three times a day (TID) | ORAL | Status: DC | PRN

## 2012-09-23 MED ORDER — LISINOPRIL-HYDROCHLOROTHIAZIDE 20-12.5 MG PO TABS: 1.0000 | ORAL_TABLET | Freq: Two times a day (BID) | ORAL | Status: DC

## 2012-09-23 MED ORDER — COLCHICINE 0.6 MG PO TABS: 0.6000 mg | ORAL_TABLET | Freq: Every day | ORAL | Status: DC

## 2012-09-23 MED ORDER — METFORMIN HCL 500 MG PO TABS: 500.0000 mg | ORAL_TABLET | Freq: Two times a day (BID) | ORAL | Status: DC

## 2012-09-23 NOTE — Assessment & Plan Note (Signed)
BP Readings from Last 3 Encounters:  09/23/12 104/70  04/19/12 122/84  02/20/12 100/60   BP stable, continue current meds.

## 2012-09-23 NOTE — Progress Notes (Signed)
Subjective:    Patient ID: [name] [name], male    DOB: 1951/01/27, 62 y.o.   MRN: [id number]  HPI  [name] [name] is a 62 yr old male who presents today for follow up.  1) HTN-  Currently maintained on lisinopril-hctz  2) DM2-  He uses lyrica for neuropathy. Reports that his fasting sugar is generally around 140.  Reports less active and has gained weight.  He is concerned about recent weight gain.  He has gained 50 pounds since last September.   3) Depression- currently on elavil. Reports that his mood is "ok."  Sleeping ok.  Denies myalgia.    4) Hyperlipidemia- on pravastatin. He is non-fasting today.   Review of Systems See HPI  Past Medical History  Diagnosis Date  . Vein, varicose   . Hyperlipidemia   . Varicose veins   . Complication of anesthesia     difficulty waking up  . Hypertension     Takes prinzide daily  . Diabetes mellitus     not consistent on taking medications  . Peripheral vascular disease   . Chronic kidney disease     acute renal failure in February admission  . Anemia     hx of blood transfusion  . Wound check, dressing change     dressing changes 3x/week; open wound  . Depression   . History of headache   . History of hepatitis C     competed treatment  . Neuromuscular disorder     gout and neuropathy  . History of blood clots 1986 or 87    blood clot in groin?    History   Social History  . Marital Status: Single    Spouse Name: N/A    Number of Children: 5  . Years of Education: N/A   Occupational History  . Not on file.   Social History Main Topics  . Smoking status: Former Smoker -- 1.00 packs/day for 35 years    Types: Cigars    Quit date: 11/12/2011  . Smokeless tobacco: Never Used  . Alcohol Use: No  . Drug Use: No  . Sexually Active: Not on file   Other Topics Concern  . Not on file   Social History Narrative   Regular exercise:  Walks a little every day.   Lives with 66 yr old son.   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   Quit smoking few months ago.   Goes to [employer]- drug free since 1994.   Former Etoh abuse.    Past Surgical History  Procedure Laterality Date  . Varicose vein surgery    . Hemorrhoid surgery    . Vascular surgery  1980s    left leg  . Femoral-popliteal bypass graft  11/24/2011    Procedure: BYPASS GRAFT FEMORAL-POPLITEAL ARTERY;  Surgeon: Larina Earthly, MD;  Location: [facility] OR;  Service: Vascular;  Laterality: Right;    Family History  Problem Relation Age of Onset  . Cancer Mother     liver?  Marland Kitchen Hyperlipidemia Father   . Hypertension Father   . Diabetes Father   . Alcohol abuse Father   . Arthritis Father   . Kidney disease Father     Allergies  Allergen Reactions  . Pork-Derived Products     Current Outpatient Prescriptions on File Prior to Visit  Medication Sig Dispense Refill  . cephALEXin (KEFLEX) 500 MG capsule Take 1,000 mg by mouth 2 (two) times daily.      Marland Kitchen  Ferrous Sulfate (IRON) 325 (65 FE) MG TABS Take 1 tablet by mouth daily.      Marland Kitchen LYRICA 300 MG capsule TAKE ONE CAPSULE BY MOUTH TWICE A DAY  60 capsule  0  . Oxycodone HCl 20 MG TABS Take 1 tablet (20 mg total) by mouth 4 (four) times daily as needed (for pain). Take 1/2 - 1 tablet (10-20mg ) by mouth 2-4 times daily as needed for pain  30 tablet  0  . vitamin B-12 (CYANOCOBALAMIN) 1000 MCG tablet Take 1,000 mcg by mouth daily.       No current facility-administered medications on file prior to visit.    BP 104/70  Pulse 86  Temp(Src) 98.6 F (37 C) (Oral)  Resp 16  Ht 6' 0.25" (1.835 m)  Wt 280 lb (127.007 kg)  BMI 37.72 kg/m2  SpO2 93%       Objective:   Physical Exam  Constitutional: He is oriented to person, place, and time. He appears well-developed and well-nourished. No distress.  HENT:  Head: Normocephalic and atraumatic.  Eyes: No scleral icterus.  Cardiovascular: Normal rate and regular rhythm.   No murmur heard. Pulmonary/Chest:  Effort normal and breath sounds normal. No respiratory distress. He has no wheezes. He has no rales. He exhibits no tenderness.  Neurological: He is alert and oriented to person, place, and time.  Psychiatric: His speech is normal and behavior is normal. Judgment and thought content normal. His affect is angry and blunt.          Assessment & Plan:  Pt expresses that he is upset that he will be late to pick up his son at 3:50.  His appointment was scheduled for 3:15 and appointment was completed by 4:00.  I offered to let him reschedule (but he declined).  I apologized that we were running a little behind this afternoon.

## 2012-09-23 NOTE — Assessment & Plan Note (Signed)
Pt reports that this is well controlled on elavil. Continue same.

## 2012-09-23 NOTE — Assessment & Plan Note (Signed)
Sugars above goal fasting per report. Will check A1C and plan to adjust meds if elevated.  We also discussed his recent weight gain. Check TSH. Recommended that he had more fresh vegetables to his diet and that he start counting calories using myfitnesspal. Recommended that he try to keep calories 1500-1800 a day and start some regular exercise such as walking.

## 2012-09-23 NOTE — Patient Instructions (Addendum)
Please return fasting tomorrow AM for lab work.  Follow up in 3 months.  Add aspirin 81 mg once daily for heart protection.

## 2012-09-23 NOTE — Assessment & Plan Note (Signed)
Unchanged. Refill provided for lyrica.

## 2012-09-23 NOTE — Assessment & Plan Note (Signed)
Check Hep C viral load and FLP.

## 2012-09-24 ENCOUNTER — Telehealth: Payer: Self-pay | Admitting: *Deleted

## 2012-09-24 DIAGNOSIS — Z8619 Personal history of other infectious and parasitic diseases: Secondary | ICD-10-CM

## 2012-09-24 NOTE — Telephone Encounter (Signed)
Received message from pt that insurance will not cover indomethacin. Received fax from [facility] for prior auth and forwarded to Provider for completion.

## 2012-09-24 NOTE — Telephone Encounter (Signed)
Hepatitis C RNA quantitative ordered yesterday had the incorrect resulting agency of [facility]. Obtained correct test # from [facility] and reordered test. Incorrect test code has been cancelled.

## 2012-09-25 ENCOUNTER — Other Ambulatory Visit: Payer: Self-pay | Admitting: Family

## 2012-09-25 LAB — MICROALBUMIN / CREATININE URINE RATIO
Creatinine, Urine: 145.5 mg/dL
Microalb Creat Ratio: 3.4 mg/g (ref 0.0–30.0)
Microalb, Ur: 0.5 mg/dL (ref 0.00–1.89)

## 2012-09-25 LAB — HEMOGLOBIN A1C
Hgb A1c MFr Bld: 6.3 % — ABNORMAL HIGH (ref <5.7)
Mean Plasma Glucose: 134 mg/dL — ABNORMAL HIGH (ref <117)

## 2012-09-25 LAB — BASIC METABOLIC PANEL
BUN: 31 mg/dL — ABNORMAL HIGH (ref 6–23)
Calcium: 9 mg/dL (ref 8.4–10.5)
Creat: 1.68 mg/dL — ABNORMAL HIGH (ref 0.50–1.35)
Glucose, Bld: 91 mg/dL (ref 70–99)

## 2012-09-25 LAB — HEPATIC FUNCTION PANEL
Albumin: 3.9 g/dL (ref 3.5–5.2)
Bilirubin, Direct: <0.1 mg/dL (ref 0.0–0.3)
Total Bilirubin: 0.2 mg/dL — ABNORMAL LOW (ref 0.3–1.2)

## 2012-09-25 LAB — TSH: TSH: 1.403 u[IU]/mL (ref 0.350–4.500)

## 2012-09-25 LAB — LIPID PANEL
HDL: 28 mg/dL — ABNORMAL LOW (ref >39)
LDL Cholesterol: 86 mg/dL (ref 0–99)

## 2012-09-26 LAB — HEPATITIS C RNA QUANTITATIVE: HCV Quantitative: NOT DETECTED IU/mL (ref <15)

## 2012-09-27 ENCOUNTER — Encounter: Payer: Self-pay | Admitting: Family

## 2012-09-27 NOTE — Telephone Encounter (Signed)
Form completed.

## 2012-09-27 NOTE — Telephone Encounter (Signed)
Spoke w/pt RE: past used & failed medications; completed PA form & faxed to insurance for approval/SLS

## 2012-09-30 ENCOUNTER — Telehealth: Payer: Self-pay | Admitting: Family

## 2012-09-30 NOTE — Telephone Encounter (Signed)
Patient informed, understood & agreed/SLS Just received prior authorization on Indocin on Fri, 04.25.14; pt reports has not began medication as of yet, told to Hold until after f/u lab [BMET] and would contact him back if any further instructions from provider/SLS Please advise.

## 2012-09-30 NOTE — Telephone Encounter (Signed)
Received authorization for indomethacin through 06/04/13. Awaiting repeat BMP as instructed below before advising pt to proceed with medication.

## 2012-09-30 NOTE — Telephone Encounter (Signed)
Pls call pt and let him know that his creatinine is elevated (part of kidney function). This is new for him and may be due to indocin.  I would like him to stop indocin and repeat bmet in 2 weeks, dx is renal insufficiency. Hepatitis C is undetectable in his blood.  Liver function looks good. Sugar is controlled.  Triglycerides up a bit.  He should try to avoid white fluffy carbs and concentrated sweets.

## 2012-10-01 ENCOUNTER — Telehealth: Payer: Self-pay | Admitting: *Deleted

## 2012-10-01 NOTE — Telephone Encounter (Signed)
Received call from pt stating his gout symptoms (foot swelling and pain) have increased since being off of indomethacin. Pt was recently told to stop medication due to increased creatinine level and repeat kidney function in 2 weeks. Pt requests alternative for his gout. Per verbal from Provider pt should take 2 colchicine now followed by 1 an hour later then resume once daily dose. If this does not calm symptoms pt is to let us know as she may consider adding short course of steroid. Pt voices understanding.

## 2012-10-02 ENCOUNTER — Telehealth: Payer: Self-pay | Admitting: *Deleted

## 2012-10-02 NOTE — Telephone Encounter (Signed)
Received fax from [facility] requesting office note supporting the secondary diagnosis on Certifying Physician Form or complete form re: foot exam. Office note was included with original fax but reprinted 09/23/12 and 04/19/12 office notes and faxed to [phone].

## 2012-10-21 ENCOUNTER — Other Ambulatory Visit: Payer: Self-pay | Admitting: Family

## 2012-10-22 NOTE — Telephone Encounter (Signed)
Refill request for Lyrica Last filled by MD on -09/24/10 #60 x0 Last seen-09/23/12 F/U-none Please advise refill?

## 2012-10-31 ENCOUNTER — Telehealth: Payer: Self-pay | Admitting: *Deleted

## 2012-10-31 DIAGNOSIS — R7989 Other specified abnormal findings of blood chemistry: Secondary | ICD-10-CM

## 2012-10-31 NOTE — Telephone Encounter (Signed)
Patient notified. Called pharmacy to make sure they had received rx and that there are still refills. Patient stated that he will come in for labs tomorrow.

## 2012-10-31 NOTE — Telephone Encounter (Signed)
Yes have him try the colchicine and if no improvement have him come in and let us check just his kidney's tomorrow so we can decide what to use

## 2012-10-31 NOTE — Telephone Encounter (Signed)
Spoke with pt re: current right great toe pain. Thinks he is having a flare of his gout. Upon review of chart notes from April he was advised to stop indomethacin as his creatinine was elevated. He was supposed to have returned to the lab for recheck 2 weeks later to determine if the elevation was coming from the medication but never repeated lab. He will return today to recheck creatinine as he has not had inodmethacin in 2 months. Pt was advised at last flare up to take 2 colchicine now then 1 tablet 1 hour later then resume once daily dosing. Should he try the same thing this time?   Office Message [address] [place], [place] [address] p. [phone] f. [phone] To: Lorrin Mais Fax: [phone] From: [facility] Date/ Time: 10/30/2012 9:09 PM Taken By: Patria Mane, CSR Caller: Charmaine Downs Facility: home Patient: [name] [name] DOB: 1950/12/31 Phone: Reason for Call: Pt is requesting a call back from Dr Peggyann Juba. He is having severe pain with his gout.

## 2012-11-06 ENCOUNTER — Telehealth: Payer: Self-pay | Admitting: Family

## 2012-11-06 DIAGNOSIS — M109 Gout, unspecified: Secondary | ICD-10-CM

## 2012-11-06 NOTE — Telephone Encounter (Signed)
Pls call pt and let him know that his insurance company is recommending that he stop indomethacin due to increased risk of dangerous side effects in his age group.  He should discontinue and instead try tylenol 500 mg 4 times daily prn.

## 2012-11-14 NOTE — Telephone Encounter (Signed)
Lets have him complete a uric acid level.  Dx gout.  If this is elevated, we can also add allopurinol to help prevent gout flare.

## 2012-11-14 NOTE — Telephone Encounter (Signed)
Notified pt. He states that he is unable to take Tylenol. Was prescribed percocet in the hospital and had vomiting and now thinks it is the tylenol that he doesn't tolerate. Pt is taking colchicine once a day and feels like his gout is going to flare up again. Wants to know if there are any other medication options?

## 2012-11-18 NOTE — Telephone Encounter (Signed)
.  left message to have patient return my call.  

## 2012-11-20 NOTE — Telephone Encounter (Signed)
Notified pt with Mellissa response. Entered lab for uric acid...lmb

## 2012-11-22 ENCOUNTER — Telehealth: Payer: Self-pay | Admitting: *Deleted

## 2012-11-22 NOTE — Telephone Encounter (Signed)
Patient called requesting pain medication, stated "pain is getting bad"; attempted to reach patient at number given and could not get in touch [facility] for operator number? & then said 'goodbye'], provider out of office, will address again later [see prior notes RE; this matter]/SLS

## 2012-12-02 NOTE — Telephone Encounter (Signed)
FYI: Pt was in [facility] 06.20.14-06.25.14 [facility] & Gout]; he will be in office for appointment 07.02.14 @10 :00a to discuss pain medication, new referral for pain mgt clinic in [place], [place] has [1] appt left & referral will expire and having his uric acid & kidney function checked [d/c indomethacin d/t kidney function previously/SLS

## 2012-12-03 ENCOUNTER — Telehealth: Payer: Self-pay | Admitting: Family

## 2012-12-03 NOTE — Telephone Encounter (Signed)
Noted  

## 2012-12-03 NOTE — Telephone Encounter (Signed)
Received medical records from [facility] °

## 2012-12-04 ENCOUNTER — Ambulatory Visit (INDEPENDENT_AMBULATORY_CARE_PROVIDER_SITE_OTHER): Payer: PRIVATE HEALTH INSURANCE | Admitting: Family

## 2012-12-04 ENCOUNTER — Encounter: Payer: Self-pay | Admitting: Family

## 2012-12-04 VITALS — BP 128/70 | HR 76 | Temp 98.0°F | Resp 16 | Wt 260.0 lb

## 2012-12-04 DIAGNOSIS — M109 Gout, unspecified: Secondary | ICD-10-CM

## 2012-12-04 DIAGNOSIS — R7989 Other specified abnormal findings of blood chemistry: Secondary | ICD-10-CM

## 2012-12-04 DIAGNOSIS — M545 Low back pain: Secondary | ICD-10-CM

## 2012-12-04 DIAGNOSIS — R55 Syncope and collapse: Secondary | ICD-10-CM

## 2012-12-04 DIAGNOSIS — N289 Disorder of kidney and ureter, unspecified: Secondary | ICD-10-CM

## 2012-12-04 DIAGNOSIS — G8929 Other chronic pain: Secondary | ICD-10-CM

## 2012-12-04 NOTE — Assessment & Plan Note (Signed)
Improved following prednisone taper and colchicine. Continue colchicine. Check follow up uric acid level.

## 2012-12-04 NOTE — Assessment & Plan Note (Signed)
He is requesting referral to another pain management specialist. Will refer to Dr. Jordan Likes.

## 2012-12-04 NOTE — Assessment & Plan Note (Signed)
Will check follow up bmet, was likely due to dehydration.

## 2012-12-04 NOTE — Patient Instructions (Addendum)
Continue colchicine. Complete lab work prior to leaving.   You will be contacted about your referral to pain clinic. Follow up in 3 months.

## 2012-12-04 NOTE — Progress Notes (Signed)
Subjective:    Patient ID: [name] [name], male    DOB: [birth year], 62 y.o.   MRN: [id number]  HPI  [name] [name] is a 62 yr old male who presents today for hospital follow up.  He was admitted to [facility] on 11/24/12 with chief complaining of bilateral foot pain and questionable syncope. Records are reviewed.  It was felt that his ? Syncope was vasovagal in setting of narcotic use and dehydration as he was found to have acute renal insufficiency. Max Creatinine was 1.5 but this apparently normalized after hydration coming down to 0.9 per discharge summary. Or note, his uric acid level was noted to be elevated. He was continued on colcrys and was given a prednisone taper at discharge.   Today he reports some mild toe soreness but much improved.    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 PSA was normal.  He was evaluated by dr. Pete Glatter who will see pt for outpt cystoscopy.    Review of Systems See HPI  Past Medical History  Diagnosis Date  . Vein, varicose   . Hyperlipidemia   . Varicose veins   . Complication of anesthesia     difficulty waking up  . Hypertension     Takes prinzide daily  . Diabetes mellitus     not consistent on taking medications  . Peripheral vascular disease   . Chronic kidney disease     acute renal failure in February admission  . Anemia     hx of blood transfusion  . Wound check, dressing change     dressing changes 3x/week; open wound  . Depression   . History of headache   . History of hepatitis C     competed treatment  . Neuromuscular disorder     gout and neuropathy  . History of blood clots 1986 or 87    blood clot in groin?    History   Social History  . Marital Status: Single    Spouse Name: N/A    Number of Children: 5  . Years of Education: N/A   Occupational History  . Not on file.   Social History Main Topics  . Smoking status: Former Smoker -- 1.00  packs/day for 35 years    Types: Cigars    Quit date: 11/12/2011  . Smokeless tobacco: Never Used  . Alcohol Use: No  . Drug Use: No  . Sexually Active: Not on file   Other Topics Concern  . Not on file   Social History Narrative   Regular exercise:  Walks a little every day.   Lives with 52 yr old son.   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   Quit smoking few months ago.   Goes to [employer]- drug free since 1994.   Former Etoh abuse.    Past Surgical History  Procedure Laterality Date  . Varicose vein surgery    . Hemorrhoid surgery    . Vascular surgery  1980s    left leg  . Femoral-popliteal bypass graft  11/24/2011    Procedure: BYPASS GRAFT FEMORAL-POPLITEAL ARTERY;  Surgeon: Larina Earthly, MD;  Location: [facility] OR;  Service: Vascular;  Laterality: Right;    Family History  Problem Relation Age of Onset  . Cancer Mother     liver?  Marland Kitchen Hyperlipidemia Father   . Hypertension Father   . Diabetes Father   .  Alcohol abuse Father   . Arthritis Father   . Kidney disease Father     Allergies  Allergen Reactions  . Percocet (Oxycodone-Acetaminophen) Nausea And Vomiting  . Pork-Derived Products     Current Outpatient Prescriptions on File Prior to Visit  Medication Sig Dispense Refill  . amitriptyline (ELAVIL) 25 MG tablet Take 1 tablet (25 mg total) by mouth 3 (three) times daily as needed.  90 tablet  2  . aspirin EC 81 MG tablet Take 81 mg by mouth daily.      . colchicine 0.6 MG tablet Take 1 tablet (0.6 mg total) by mouth daily.  30 tablet  2  . Ferrous Sulfate (IRON) 325 (65 FE) MG TABS Take 1 tablet by mouth daily.      Marland Kitchen gabapentin (NEURONTIN) 800 MG tablet Take 1 tablet (800 mg total) by mouth 2 (two) times daily with a meal.  60 tablet  0  . lisinopril-hydrochlorothiazide (PRINZIDE,ZESTORETIC) 20-12.5 MG per tablet Take 1 tablet by mouth 2 (two) times daily.  60 tablet  2  . LYRICA 300 MG capsule TAKE ONE CAPSULE BY MOUTH  TWICE A DAY  60 capsule  0  . metFORMIN (GLUCOPHAGE) 500 MG tablet Take 1 tablet (500 mg total) by mouth 2 (two) times daily with a meal.  60 tablet  2  . Oxycodone HCl 20 MG TABS Take 1 tablet (20 mg total) by mouth 4 (four) times daily as needed (for pain). Take 1/2 - 1 tablet (10-20mg ) by mouth 2-4 times daily as needed for pain  30 tablet  0  . pravastatin (PRAVACHOL) 20 MG tablet TAKE 1 TABLET BY MOUTH AT BEDTIME  30 tablet  2  . pregabalin (LYRICA) 300 MG capsule TAKE ONE CAPSULE BY MOUTH TWICE A DAY  60 capsule  0  . vitamin B-12 (CYANOCOBALAMIN) 1000 MCG tablet Take 1,000 mcg by mouth daily.       No current facility-administered medications on file prior to visit.    BP 128/70  Pulse 76  Temp(Src) 98 F (36.7 C) (Oral)  Resp 16  Wt 260 lb (117.935 kg)  BMI 35.02 kg/m2  SpO2 97%       Objective:   Physical Exam  Constitutional: He is oriented to person, place, and time. He appears well-developed and well-nourished. No distress.  HENT:  Head: Normocephalic and atraumatic.  Cardiovascular: Normal rate and regular rhythm.   No murmur heard. Pulmonary/Chest: Effort normal and breath sounds normal. No respiratory distress. He has no wheezes. He has no rales. He exhibits no tenderness.  Neurological: He is alert and oriented to person, place, and time.  Psychiatric: He has a normal mood and affect. His behavior is normal. Judgment and thought content normal.          Assessment & Plan:

## 2012-12-04 NOTE — Assessment & Plan Note (Signed)
No further episodes.  Work up negative at the hospital.  Monitor.

## 2012-12-05 ENCOUNTER — Telehealth: Payer: Self-pay | Admitting: Family

## 2012-12-05 DIAGNOSIS — E875 Hyperkalemia: Secondary | ICD-10-CM

## 2012-12-05 LAB — BASIC METABOLIC PANEL
CO2: 24 mEq/L (ref 19–32)
Calcium: 9.6 mg/dL (ref 8.4–10.5)
Creat: 1.53 mg/dL — ABNORMAL HIGH (ref 0.50–1.35)

## 2012-12-05 LAB — URIC ACID: Uric Acid, Serum: 11.7 mg/dL — ABNORMAL HIGH (ref 4.0–7.8)

## 2012-12-05 LAB — HEPATIC FUNCTION PANEL
Albumin: 4 g/dL (ref 3.5–5.2)
Indirect Bilirubin: 0.1 mg/dL (ref 0.0–0.9)
Total Bilirubin: 0.2 mg/dL — ABNORMAL LOW (ref 0.3–1.2)
Total Protein: 7.5 g/dL (ref 6.0–8.3)

## 2012-12-05 MED ORDER — AMLODIPINE BESYLATE 5 MG PO TABS: 5.0000 mg | ORAL_TABLET | Freq: Every day | ORAL | Status: DC

## 2012-12-05 NOTE — Telephone Encounter (Signed)
LMOM with contact name and number for return call RE: results and further provider instructions/SLS  

## 2012-12-05 NOTE — Telephone Encounter (Signed)
Please call pt and let him know that his potassium is elevated.  I would like him to stop lisinopril hctz, instead start amlodipine 5 mg once daily.  Follow up in 1-2 weeks for OV to recheck potassium and blood pressure.

## 2012-12-11 NOTE — Telephone Encounter (Signed)
Left message to return my call.  

## 2012-12-12 ENCOUNTER — Telehealth: Payer: Self-pay | Admitting: *Deleted

## 2012-12-12 ENCOUNTER — Other Ambulatory Visit: Payer: Self-pay
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
## 2012-12-13 ENCOUNTER — Encounter: Payer: Self-pay | Admitting: Family

## 2012-12-17 NOTE — Telephone Encounter (Signed)
Noted  

## 2012-12-18 NOTE — Telephone Encounter (Signed)
If he is concerned about stopping the medication, then I would have him come tomorrow to repeat bmet (dx hyperkalemia).  If K+ still high then we will definitely plan to stop the lisinopril hctz and will let him know.

## 2012-12-18 NOTE — Telephone Encounter (Signed)
Completed forms faxed to [phone].

## 2012-12-18 NOTE — Telephone Encounter (Signed)
Notified pt of instructions below. He states he has been on lisinopril hctz for kidney protection and went off of it in the past and almost had "kidney failure".  Please advise.

## 2012-12-18 NOTE — Telephone Encounter (Signed)
Notified pt. He states that he discovered he had already picked up the new medication and has been taking it daily. Advised him to stop lisinopril hctz and follow up for lab and BP check tomorrow at 9:30am. Also advised him that we will continue to monitor his kidney functions on a regular basis.

## 2012-12-19 ENCOUNTER — Ambulatory Visit (INDEPENDENT_AMBULATORY_CARE_PROVIDER_SITE_OTHER): Payer: PRIVATE HEALTH INSURANCE | Admitting: Family

## 2012-12-19 VITALS — BP 110/70 | HR 90

## 2012-12-19 DIAGNOSIS — I10 Essential (primary) hypertension: Secondary | ICD-10-CM

## 2012-12-19 LAB — BASIC METABOLIC PANEL
BUN: 23 mg/dL (ref 6–23)
CO2: 20 mEq/L (ref 19–32)
Chloride: 110 mEq/L (ref 96–112)
Potassium: 4.8 mEq/L (ref 3.5–5.3)

## 2012-12-19 NOTE — Progress Notes (Signed)
Pt presented to the office for nurse visit for blood pressure check and repeat BMP.  Pt has no concerns. Advised pt to arrange follow up in October per last office note and to complete bloodwork today. He voices understanding.

## 2012-12-19 NOTE — Assessment & Plan Note (Signed)
Pt seen today for nurse visit. BP stable. Obtain BMET, continue amlodipine.

## 2012-12-20 ENCOUNTER — Telehealth: Payer: Self-pay | Admitting: *Deleted

## 2012-12-20 NOTE — Telephone Encounter (Signed)
Patient informed, understood & agreed/SLS  

## 2012-12-24 ENCOUNTER — Telehealth: Payer: Self-pay | Admitting: *Deleted

## 2012-12-24 DIAGNOSIS — G8929 Other chronic pain: Secondary | ICD-10-CM

## 2012-12-24 NOTE — Telephone Encounter (Signed)
Received message from pt requesting referral to a different pain management group.  Received message from ?Deanna (physician?) at pain management center letting us know that she saw pt for consultation on 12/23/12. States that pt is not a candidate for hydrocodone / opioid treatment due to his current federal indictment related to medications. She offered him treatment through [facility] and states pt was disappointed with the suggestion. She was not sure if pt was going to follow up with his previous pain management physician in [place]?.  Please advise re: referral to another pain management group.

## 2012-12-25 ENCOUNTER — Other Ambulatory Visit: Payer: Self-pay | Admitting: Family

## 2012-12-25 NOTE — Telephone Encounter (Signed)
Order placed. Will send request to [facility].

## 2012-12-25 NOTE — Telephone Encounter (Signed)
Lisinopril hctz denied as pt was just placed on amlodipine and advised to stop lisinopril hctz.

## 2012-12-26 NOTE — Telephone Encounter (Signed)
Notified pt. 

## 2013-01-08 ENCOUNTER — Other Ambulatory Visit: Payer: Self-pay | Admitting: Pain Medicine

## 2013-01-08 DIAGNOSIS — M545 Low back pain: Secondary | ICD-10-CM

## 2013-01-10 ENCOUNTER — Other Ambulatory Visit: Payer: Self-pay | Admitting: Pain Medicine

## 2013-01-10 DIAGNOSIS — Z139 Encounter for screening, unspecified: Secondary | ICD-10-CM

## 2013-01-14 ENCOUNTER — Other Ambulatory Visit: Payer: PRIVATE HEALTH INSURANCE

## 2013-01-14 ENCOUNTER — Telehealth: Payer: Self-pay | Admitting: *Deleted

## 2013-01-14 NOTE — Telephone Encounter (Signed)
Received fax from [facility] Requesting order for pt's diabetic supplies. Verified with pt that he would like to use this company and request forwarded to Provider for signature.

## 2013-01-15 NOTE — Telephone Encounter (Signed)
Signed.

## 2013-01-16 NOTE — Telephone Encounter (Signed)
Form faxed

## 2013-01-23 ENCOUNTER — Emergency Department (HOSPITAL_BASED_OUTPATIENT_CLINIC_OR_DEPARTMENT_OTHER): Payer: PRIVATE HEALTH INSURANCE

## 2013-01-23 ENCOUNTER — Encounter (HOSPITAL_BASED_OUTPATIENT_CLINIC_OR_DEPARTMENT_OTHER): Payer: Self-pay

## 2013-01-23 ENCOUNTER — Emergency Department (HOSPITAL_BASED_OUTPATIENT_CLINIC_OR_DEPARTMENT_OTHER)
Admission: EM | Admit: 2013-01-23 | Discharge: 2013-01-23 | Disposition: A | Discharge status: Home / Self Care | Payer: PRIVATE HEALTH INSURANCE | Attending: Emergency Medicine | Admitting: Emergency Medicine

## 2013-01-23 DIAGNOSIS — E785 Hyperlipidemia, unspecified: Secondary | ICD-10-CM | POA: Insufficient documentation

## 2013-01-23 DIAGNOSIS — Z8679 Personal history of other diseases of the circulatory system: Secondary | ICD-10-CM | POA: Insufficient documentation

## 2013-01-23 DIAGNOSIS — Z8669 Personal history of other diseases of the nervous system and sense organs: Secondary | ICD-10-CM | POA: Insufficient documentation

## 2013-01-23 DIAGNOSIS — M25439 Effusion, unspecified wrist: Secondary | ICD-10-CM | POA: Insufficient documentation

## 2013-01-23 DIAGNOSIS — I129 Hypertensive chronic kidney disease with stage 1 through stage 4 chronic kidney disease, or unspecified chronic kidney disease: Secondary | ICD-10-CM | POA: Insufficient documentation

## 2013-01-23 DIAGNOSIS — Z87891 Personal history of nicotine dependence: Secondary | ICD-10-CM | POA: Insufficient documentation

## 2013-01-23 DIAGNOSIS — E119 Type 2 diabetes mellitus without complications: Secondary | ICD-10-CM | POA: Insufficient documentation

## 2013-01-23 DIAGNOSIS — D649 Anemia, unspecified: Secondary | ICD-10-CM | POA: Insufficient documentation

## 2013-01-23 DIAGNOSIS — M109 Gout, unspecified: Secondary | ICD-10-CM | POA: Insufficient documentation

## 2013-01-23 DIAGNOSIS — N189 Chronic kidney disease, unspecified: Secondary | ICD-10-CM | POA: Insufficient documentation

## 2013-01-23 DIAGNOSIS — M10022 Idiopathic gout, left elbow: Secondary | ICD-10-CM

## 2013-01-23 DIAGNOSIS — I739 Peripheral vascular disease, unspecified: Secondary | ICD-10-CM | POA: Insufficient documentation

## 2013-01-23 DIAGNOSIS — Z79899 Other long term (current) drug therapy: Secondary | ICD-10-CM | POA: Insufficient documentation

## 2013-01-23 DIAGNOSIS — Z7982 Long term (current) use of aspirin: Secondary | ICD-10-CM | POA: Insufficient documentation

## 2013-01-23 DIAGNOSIS — Z8619 Personal history of other infectious and parasitic diseases: Secondary | ICD-10-CM | POA: Insufficient documentation

## 2013-01-23 DIAGNOSIS — F329 Major depressive disorder, single episode, unspecified: Secondary | ICD-10-CM | POA: Insufficient documentation

## 2013-01-23 DIAGNOSIS — F3289 Other specified depressive episodes: Secondary | ICD-10-CM | POA: Insufficient documentation

## 2013-01-23 DIAGNOSIS — Z86718 Personal history of other venous thrombosis and embolism: Secondary | ICD-10-CM | POA: Insufficient documentation

## 2013-01-23 HISTORY — DX: Gout, unspecified: M10.9

## 2013-01-23 MED ORDER — PREDNISONE 10 MG PO TABS: ORAL_TABLET | ORAL | Status: DC

## 2013-01-23 MED ORDER — OXYCODONE HCL 5 MG PO TABS: 5.0000 mg | ORAL_TABLET | ORAL | Status: DC | PRN

## 2013-01-23 NOTE — ED Provider Notes (Signed)
Medical screening examination/treatment/procedure(s) were performed by non-physician practitioner and as supervising physician I was immediately available for consultation/collaboration.   Glynn Octave, MD 01/23/13 1534

## 2013-01-23 NOTE — ED Notes (Signed)
C/o pain/swelling to left elbow x 2 days-denies injury-states feels like when he had gout in his toe

## 2013-01-23 NOTE — ED Provider Notes (Signed)
CSN: [id number]     Arrival date & time 01/23/13  1140 History     First MD Initiated Contact with Patient 01/23/13 1223     Chief Complaint  Patient presents with  . Elbow Pain   (Consider location/radiation/quality/duration/timing/severity/associated sxs/prior Treatment) Patient is a 62 y.o. male presenting with arm injury. The history is provided by the patient. No language interpreter was used.  Arm Injury Location:  Elbow Injury: no   Elbow location:  L elbow Pain details:    Quality:  Aching   Radiates to:  L elbow   Severity:  Moderate   Timing:  Constant   Progression:  Worsening Chronicity:  New Prior injury to area:  No Worsened by:  Nothing tried Ineffective treatments:  None tried Pt complains of pain and swelling to left elbow.  Pt reports he has had gout in his feet and this feels the same  Past Medical History  Diagnosis Date  . Vein, varicose   . Hyperlipidemia   . Varicose veins   . Complication of anesthesia     difficulty waking up  . Hypertension     Takes prinzide daily  . Diabetes mellitus     not consistent on taking medications  . Peripheral vascular disease   . Chronic kidney disease     acute renal failure in February admission  . Anemia     hx of blood transfusion  . Wound check, dressing change     dressing changes 3x/week; open wound  . Depression   . History of headache   . History of hepatitis C     competed treatment  . Neuromuscular disorder     gout and neuropathy  . History of blood clots 1986 or 87    blood clot in groin?  . Gout    Past Surgical History  Procedure Laterality Date  . Varicose vein surgery    . Hemorrhoid surgery    . Vascular surgery  1980s    left leg  . Femoral-popliteal bypass graft  11/24/2011    Procedure: BYPASS GRAFT FEMORAL-POPLITEAL ARTERY;  Surgeon: Larina Earthly, MD;  Location: [facility] OR;  Service: Vascular;  Laterality: Right;   Family History  Problem Relation Age of Onset  . Cancer Mother      liver?  Marland Kitchen Hyperlipidemia Father   . Hypertension Father   . Diabetes Father   . Alcohol abuse Father   . Arthritis Father   . Kidney disease Father    History  Substance Use Topics  . Smoking status: Former Smoker -- 1.00 packs/day for 35 years    Types: Cigars    Quit date: 11/12/2011  . Smokeless tobacco: Never Used  . Alcohol Use: No    Review of Systems  Musculoskeletal: Positive for joint swelling.  All other systems reviewed and are negative.    Allergies  Percocet and Pork-derived products  Home Medications   Current Outpatient Rx  Name  Route  Sig  Dispense  Refill  . amitriptyline (ELAVIL) 25 MG tablet      TAKE 1 TABLET BY MOUTH 3 TIMES A DAY AS NEEDED   90 tablet   2   . amLODipine (NORVASC) 5 MG tablet   Oral   Take 1 tablet (5 mg total) by mouth daily.   30 tablet   3   . aspirin EC 81 MG tablet   Oral   Take 81 mg by mouth daily.         Marland Kitchen  COLCRYS 0.6 MG tablet      TAKE 1 TABLET BY MOUTH EVERY DAY   30 tablet   2   . Ferrous Sulfate (IRON) 325 (65 FE) MG TABS   Oral   Take 1 tablet by mouth daily.         Marland Kitchen gabapentin (NEURONTIN) 800 MG tablet   Oral   Take 1 tablet (800 mg total) by mouth 2 (two) times daily with a meal.   60 tablet   0   . LYRICA 300 MG capsule      TAKE ONE CAPSULE BY MOUTH TWICE A DAY   60 capsule   0   . metFORMIN (GLUCOPHAGE) 500 MG tablet      TAKE 1 TABLET BY MOUTH TWICE A DAY WITH A MEAL   60 tablet   2   . Oxycodone HCl 20 MG TABS   Oral   Take 1 tablet (20 mg total) by mouth 4 (four) times daily as needed (for pain). Take 1/2 - 1 tablet (10-20mg ) by mouth 2-4 times daily as needed for pain   30 tablet   0   . pravastatin (PRAVACHOL) 20 MG tablet      TAKE 1 TABLET BY MOUTH AT BEDTIME   30 tablet   2   . pregabalin (LYRICA) 300 MG capsule      TAKE ONE CAPSULE BY MOUTH TWICE A DAY   60 capsule   0   . vitamin B-12 (CYANOCOBALAMIN) 1000 MCG tablet   Oral   Take 1,000 mcg by  mouth daily.          BP 153/96  Pulse 95  Temp(Src) 98.3 F (36.8 C) (Oral)  Resp 20  Ht 6\' 1"  (1.854 m)  Wt 260 lb (117.935 kg)  BMI 34.31 kg/m2  SpO2 98% Physical Exam  Vitals reviewed. Constitutional: He is oriented to person, place, and time. He appears well-developed and well-nourished.  HENT:  Head: Normocephalic and atraumatic.  Cardiovascular: Normal rate.   Pulmonary/Chest: Effort normal.  Musculoskeletal: He exhibits tenderness.  Swollen tender left elbow  Neurological: He is alert and oriented to person, place, and time. He has normal reflexes.  Skin: Skin is warm.  Psychiatric: He has a normal mood and affect.    ED Course   Procedures (including critical care time)  Labs Reviewed - No data to display Dg Elbow Complete Left  01/23/2013   *RADIOLOGY REPORT*  Clinical Data: Double pain.  No trauma.  History of gout.  LEFT ELBOW - COMPLETE 3+ VIEW  Comparison: None.  Findings: No fracture or dislocation.  Prominent soft tissue swelling olecranon region.  This may represent changes of gout.  No adjacent bony erosion.  Radiopaque structure at the level of the olecranon may represent a spur possibly contributing to bursitis.  Soft tissue infection not excluded.  No plain film evidence of osteomyelitis.  IMPRESSION: Prominent soft tissue swelling olecranon region possibly related to gout.  Cellulitis not excluded.  Prominent olecranon spur suspected.  Soft tissue calcifications could conceivably cause this appearance.   Original Report Authenticated By: Lacy Duverney, M.D.   1. Idiopathic gout of left elbow     MDM  Pt advised to see his Md for recheck next week.   Return if worse, monitor temp.   I think pt has gout.   Pt has taken prednisone in the past.  Pt advised he must monitor glucose carefully  Elson Areas, PA-C 01/23/13 1353

## 2013-01-30 DIAGNOSIS — M25579 Pain in unspecified ankle and joints of unspecified foot: Secondary | ICD-10-CM | POA: Diagnosis not present

## 2013-01-30 DIAGNOSIS — R262 Difficulty in walking, not elsewhere classified: Secondary | ICD-10-CM | POA: Diagnosis not present

## 2013-01-30 DIAGNOSIS — M1A00X0 Idiopathic chronic gout, unspecified site, without tophus (tophi): Secondary | ICD-10-CM | POA: Diagnosis not present

## 2013-01-30 DIAGNOSIS — E1142 Type 2 diabetes mellitus with diabetic polyneuropathy: Secondary | ICD-10-CM | POA: Diagnosis not present

## 2013-01-30 NOTE — Telephone Encounter (Signed)
Received call from [facility] at [phone]. Advised her that order was faxed to them on 01/16/13. She states they never received it. Called scanning dept and asked them to scan form so it can be refaxed to [phone].

## 2013-01-30 NOTE — Telephone Encounter (Signed)
Scanning Dept states they have not been able to find previous document from the 14th. Will complete additional order and fax it to [phone]. Form forwarded to Provider for completion.

## 2013-01-31 NOTE — Telephone Encounter (Signed)
Faxed form to [facility] at [phone].

## 2013-02-11 DIAGNOSIS — E6609 Other obesity due to excess calories: Secondary | ICD-10-CM | POA: Insufficient documentation

## 2013-02-11 DIAGNOSIS — M5416 Radiculopathy, lumbar region: Secondary | ICD-10-CM | POA: Insufficient documentation

## 2013-02-12 ENCOUNTER — Ambulatory Visit: Payer: PRIVATE HEALTH INSURANCE | Admitting: Family

## 2013-02-12 ENCOUNTER — Telehealth: Payer: Self-pay | Admitting: *Deleted

## 2013-02-12 DIAGNOSIS — I10 Essential (primary) hypertension: Secondary | ICD-10-CM

## 2013-02-12 NOTE — Telephone Encounter (Signed)
Received fax from [facility] Requesting order for compounded medication for back pain.  Rx #117 includes: dextromethorphan 8%, baclofen 2%, cyclobenzaprine 2%, flurbiprofen 15%, gabapentin 6%, bupivacaine 2%.  Verified with pt that he is wanting to try this medication. Order signed and faxed back to [phone].

## 2013-02-12 NOTE — Progress Notes (Signed)
  Subjective:    Patient ID: [name] [name], male    DOB: Dec 19, 1950, 62 y.o.   MRN: [id number]  HPI    Review of Systems     Objective:   Physical Exam        Assessment & Plan:  Pt presented late for appointment.  Checked in, then left without being seen.

## 2013-02-19 ENCOUNTER — Telehealth: Payer: Self-pay | Admitting: *Deleted

## 2013-02-19 NOTE — Telephone Encounter (Signed)
Received fax from [facility] requesting order for FBCGL cream for pt's back pain. Faxed denial as we previously faxed order to [facility] For the same request.

## 2013-02-28 DIAGNOSIS — M25579 Pain in unspecified ankle and joints of unspecified foot: Secondary | ICD-10-CM | POA: Diagnosis not present

## 2013-02-28 DIAGNOSIS — M1A00X0 Idiopathic chronic gout, unspecified site, without tophus (tophi): Secondary | ICD-10-CM | POA: Diagnosis not present

## 2013-02-28 DIAGNOSIS — E1142 Type 2 diabetes mellitus with diabetic polyneuropathy: Secondary | ICD-10-CM | POA: Diagnosis not present

## 2013-02-28 DIAGNOSIS — R262 Difficulty in walking, not elsewhere classified: Secondary | ICD-10-CM | POA: Diagnosis not present

## 2013-03-18 ENCOUNTER — Other Ambulatory Visit: Payer: Self-pay | Admitting: Family

## 2013-03-18 NOTE — Telephone Encounter (Signed)
Received message from pt requesting appt for "blisters and swelling on left foot".  Also requests refill of BP med and lyrica. If pt is requesting amlodipine, refill was sent on 12/05/12, #30 x 3 refills which should get him through until 04/08/13. Attempted to reach pt and was unable to leave message. Lyrica last sent from our office on 10/11/13, #60 x no refills. Please advise.

## 2013-03-20 NOTE — Telephone Encounter (Signed)
OK to refill lyrica #60 no refills.  Needs OV for foot issue please.

## 2013-03-24 ENCOUNTER — Other Ambulatory Visit: Payer: Self-pay | Admitting: Family

## 2013-03-24 ENCOUNTER — Ambulatory Visit: Payer: PRIVATE HEALTH INSURANCE | Admitting: Family

## 2013-03-25 NOTE — Telephone Encounter (Signed)
Rx called to pharmacy voicemail. Pt was scheduled for appt yesterday and has rescheduled for 03/26/13.

## 2013-03-26 ENCOUNTER — Ambulatory Visit: Payer: PRIVATE HEALTH INSURANCE | Admitting: Family

## 2013-03-31 ENCOUNTER — Ambulatory Visit (INDEPENDENT_AMBULATORY_CARE_PROVIDER_SITE_OTHER): Payer: PRIVATE HEALTH INSURANCE | Admitting: Family

## 2013-03-31 ENCOUNTER — Encounter: Payer: Self-pay | Admitting: Family

## 2013-03-31 VITALS — BP 140/84 | HR 90 | Temp 98.4°F | Resp 16 | Ht 72.25 in | Wt 268.1 lb

## 2013-03-31 DIAGNOSIS — M545 Low back pain: Secondary | ICD-10-CM

## 2013-03-31 DIAGNOSIS — B353 Tinea pedis: Secondary | ICD-10-CM

## 2013-03-31 DIAGNOSIS — E119 Type 2 diabetes mellitus without complications: Secondary | ICD-10-CM

## 2013-03-31 DIAGNOSIS — E785 Hyperlipidemia, unspecified: Secondary | ICD-10-CM

## 2013-03-31 DIAGNOSIS — I10 Essential (primary) hypertension: Secondary | ICD-10-CM

## 2013-03-31 DIAGNOSIS — G8929 Other chronic pain: Secondary | ICD-10-CM

## 2013-03-31 DIAGNOSIS — B1920 Unspecified viral hepatitis C without hepatic coma: Secondary | ICD-10-CM

## 2013-03-31 DIAGNOSIS — Z23 Encounter for immunization: Secondary | ICD-10-CM

## 2013-03-31 MED ORDER — FLUCONAZOLE 150 MG PO TABS: ORAL_TABLET | ORAL | Status: DC

## 2013-03-31 MED ORDER — CEPHALEXIN 500 MG PO CAPS: 500.0000 mg | ORAL_CAPSULE | Freq: Three times a day (TID) | ORAL | Status: AC

## 2013-03-31 MED ORDER — LISINOPRIL 10 MG PO TABS: 10.0000 mg | ORAL_TABLET | Freq: Every day | ORAL | Status: DC

## 2013-03-31 NOTE — Patient Instructions (Addendum)
Complete your lab work prior to leaving.  Please try to start wearing your diabetic shoes as tolerated. Stop amlodipine, start lisinopril. You will be contacted about your referral to the eye doctor.  Please let us know if you have not heard back within 1 week about your referral. Follow up in 1 week.

## 2013-03-31 NOTE — Assessment & Plan Note (Addendum)
Rx with diflucan and empiric keflex.  Recommend that he wear his diabetic shoes, follow up in 1 week, call sooner if symptoms worsen or if symptoms do not improve.

## 2013-03-31 NOTE — Assessment & Plan Note (Signed)
Stable. Ongoing management per [facility] pain management clinic.

## 2013-03-31 NOTE — Progress Notes (Signed)
Subjective:    Patient ID: [name] [name], male    DOB: Aug 23, 1950, 62 y.o.   MRN: [id number]  HPI  [name] [name] is a 62 yr old male who presents today for follow up.  1) DM2-  Last A1C was performed in April and was 6.3.  He is not on a ace inhibitor. He is maintained on metformin.  Reports sugars generally 120's before breakfast.  2) HTN- he is currently maintained on amlodipine 5 mg once daily.  He reports that he ran out 10 days ago.   3) History of Hep C- underwent interferon therapy- Completed March 11th, 2013. Reports virus undetectable. Had treatment in [place].   4) Hyperlipidemia- currently maintained on pravastatin.  Last LDL was at goal at 86, though trigs were elevated at 287.    5) blister left foot- noted 1 week ago.   6) Chronic pain- now on methadone- Dr. Alain Marion- pain management.  Review of Systems See HPI  Past Medical History  Diagnosis Date  . Vein, varicose   . Hyperlipidemia   . Varicose veins   . Complication of anesthesia     difficulty waking up  . Hypertension     Takes prinzide daily  . Diabetes mellitus     not consistent on taking medications  . Peripheral vascular disease   . Chronic kidney disease     acute renal failure in February admission  . Anemia     hx of blood transfusion  . Wound check, dressing change     dressing changes 3x/week; open wound  . Depression   . History of headache   . History of hepatitis C     competed treatment  . Neuromuscular disorder     gout and neuropathy  . History of blood clots 1986 or 87    blood clot in groin?  . Gout     History   Social History  . Marital Status: Single    Spouse Name: N/A    Number of Children: 5  . Years of Education: N/A   Occupational History  . Not on file.   Social History Main Topics  . Smoking status: Current Every Day Smoker -- 1.00 packs/day for 35 years    Types: Cigars    Last Attempt to Quit: 11/12/2011  . Smokeless tobacco: Never Used  . Alcohol Use: No  .  Drug Use: No  . Sexual Activity: Not on file   Other Topics Concern  . Not on file   Social History Narrative   Regular exercise:  Walks a little every day.   Lives with 57 yr old son.   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   Quit smoking few months ago.   Goes to [employer] [facility]- drug free since 1994.   Former Etoh abuse.    Past Surgical History  Procedure Laterality Date  . Varicose vein surgery    . Hemorrhoid surgery    . Vascular surgery  1980s    left leg  . Femoral-popliteal bypass graft  11/24/2011    Procedure: BYPASS GRAFT FEMORAL-POPLITEAL ARTERY;  Surgeon: Larina Earthly, MD;  Location: [facility] OR;  Service: Vascular;  Laterality: Right;    Family History  Problem Relation Age of Onset  . Cancer Mother     liver?  Marland Kitchen Hyperlipidemia Father   . Hypertension Father   . Diabetes Father   . Alcohol abuse Father   . Arthritis Father   .  Kidney disease Father     Allergies  Allergen Reactions  . Percocet [Oxycodone-Acetaminophen] Nausea And Vomiting  . Pork-Derived Products     Current Outpatient Prescriptions on File Prior to Visit  Medication Sig Dispense Refill  . amitriptyline (ELAVIL) 25 MG tablet TAKE 1 TABLET BY MOUTH 3 TIMES A DAY AS NEEDED  90 tablet  0  . aspirin EC 81 MG tablet Take 81 mg by mouth daily.      Marland Kitchen COLCRYS 0.6 MG tablet TAKE 1 TABLET BY MOUTH EVERY DAY  30 tablet  2  . Ferrous Sulfate (IRON) 325 (65 FE) MG TABS Take 1 tablet by mouth daily.      Marland Kitchen gabapentin (NEURONTIN) 800 MG tablet Take 1 tablet (800 mg total) by mouth 2 (two) times daily with a meal.  60 tablet  0  . LYRICA 300 MG capsule TAKE ONE CAPSULE BY MOUTH TWICE A DAY  60 capsule  0  . metFORMIN (GLUCOPHAGE) 500 MG tablet TAKE 1 TABLET BY MOUTH TWICE A DAY WITH A MEAL  60 tablet  3  . NONFORMULARY OR COMPOUNDED ITEM Dextromethorphan 8%, baclofen 2%, cyclobenzaprine 2%, flurbiprofen 15%, gabapentin 6% and bupivacaine 2%.      . oxyCODONE (ROXICODONE) 5  MG immediate release tablet Take 1 tablet (5 mg total) by mouth every 4 (four) hours as needed for pain.  16 tablet  0  . Oxycodone HCl 20 MG TABS Take 1 tablet (20 mg total) by mouth 4 (four) times daily as needed (for pain). Take 1/2 - 1 tablet (10-20mg ) by mouth 2-4 times daily as needed for pain  30 tablet  0  . pravastatin (PRAVACHOL) 20 MG tablet TAKE 1 TABLET BY MOUTH AT BEDTIME  90 tablet  0  . predniSONE (DELTASONE) 10 MG tablet 6,5,4,3,2,1  21 tablet  0  . vitamin B-12 (CYANOCOBALAMIN) 1000 MCG tablet Take 1,000 mcg by mouth daily.      Marland Kitchen amLODipine (NORVASC) 5 MG tablet Take 1 tablet (5 mg total) by mouth daily.  30 tablet  3   No current facility-administered medications on file prior to visit.    BP 140/84  Pulse 90  Temp(Src) 98.4 F (36.9 C) (Oral)  Resp 16  Ht 6' 0.25" (1.835 m)  Wt 268 lb 1.9 oz (121.618 kg)  BMI 36.12 kg/m2  SpO2 95%       Objective:   Physical Exam  Constitutional: He is oriented to person, place, and time. He appears well-developed and well-nourished. No distress.  HENT:  Head: Normocephalic and atraumatic.  Cardiovascular: Normal rate and regular rhythm.   No murmur heard. Pulmonary/Chest: Effort normal and breath sounds normal. No respiratory distress. He has no wheezes. He has no rales. He exhibits no tenderness.  Neurological: He is alert and oriented to person, place, and time.  Skin: Skin is warm and dry.  See DM foot exam  Psychiatric: He has a normal mood and affect. His behavior is normal. Judgment and thought content normal.          Assessment & Plan:

## 2013-03-31 NOTE — Assessment & Plan Note (Signed)
Has been out of amlodipine x 10 days. D/c amlodipine. Start lisinopril for HTN and renal protection.

## 2013-03-31 NOTE — Addendum Note (Signed)
Addended by: Mervin Kung A on: 03/31/2013 10:40 AM   Modules accepted: Orders

## 2013-03-31 NOTE — Assessment & Plan Note (Addendum)
Plan pneumovax, flu shot today. Reports last eye exam was >12 month ago.  Clinically stable. Continue metformin, obtain A1C.

## 2013-03-31 NOTE — Assessment & Plan Note (Signed)
Hep C RNA virus remained undetectable 4/14.  Check routine LFT due to statin therapy.

## 2013-03-31 NOTE — Assessment & Plan Note (Signed)
Check flp/lft.

## 2013-04-07 ENCOUNTER — Other Ambulatory Visit: Payer: Self-pay | Admitting: Family

## 2013-04-08 ENCOUNTER — Telehealth: Payer: Self-pay

## 2013-04-08 LAB — BASIC METABOLIC PANEL WITH GFR
BUN: 17 mg/dL (ref 6–23)
CO2: 17 mEq/L — ABNORMAL LOW (ref 19–32)
Calcium: 9.6 mg/dL (ref 8.4–10.5)
Creat: 1.07 mg/dL (ref 0.50–1.35)
GFR, Est African American: 86 mL/min
Glucose, Bld: 59 mg/dL — ABNORMAL LOW (ref 70–99)
Sodium: 138 mEq/L (ref 135–145)

## 2013-04-08 LAB — HEPATIC FUNCTION PANEL
Alkaline Phosphatase: 112 U/L (ref 39–117)
Bilirubin, Direct: <0.1 mg/dL (ref 0.0–0.3)
Total Bilirubin: 0.2 mg/dL — ABNORMAL LOW (ref 0.3–1.2)
Total Protein: 7.7 g/dL (ref 6.0–8.3)

## 2013-04-08 LAB — LIPID PANEL

## 2013-04-08 LAB — HEMOGLOBIN A1C: Mean Plasma Glucose: 123 mg/dL — ABNORMAL HIGH (ref <117)

## 2013-04-08 NOTE — Telephone Encounter (Signed)
Potassium 7.0 repeated and verified. specimen atmolyzed so results may be affected. Candice is faxing over report

## 2013-04-09 ENCOUNTER — Telehealth: Payer: Self-pay | Admitting: *Deleted

## 2013-04-09 DIAGNOSIS — E875 Hyperkalemia: Secondary | ICD-10-CM

## 2013-04-09 NOTE — Telephone Encounter (Signed)
Noted  

## 2013-04-09 NOTE — Telephone Encounter (Signed)
Notified pt of below instructions and he states he can return this week to repeat test. Lab order entered.

## 2013-04-09 NOTE — Telephone Encounter (Signed)
Message copied by Kathi Simpers on Wed Apr 09, 2013  3:39 PM ------      Message from: O'SULLIVAN, MELISSA      Created: Tue Apr 08, 2013 12:30 PM       Please call patient and ask him to return to repeat bmet- dx is hyperkalemia.        ------

## 2013-04-15 ENCOUNTER — Telehealth: Payer: Self-pay | Admitting: *Deleted

## 2013-04-15 DIAGNOSIS — E875 Hyperkalemia: Secondary | ICD-10-CM

## 2013-04-15 LAB — BASIC METABOLIC PANEL WITH GFR
BUN: 18 mg/dL (ref 6–23)
CO2: 18 meq/L — ABNORMAL LOW (ref 19–32)
Calcium: 9.1 mg/dL (ref 8.4–10.5)
Chloride: 108 meq/L (ref 96–112)
Creat: 1.19 mg/dL (ref 0.50–1.35)
Glucose, Bld: 71 mg/dL (ref 70–99)
Potassium: 7 meq/L (ref 3.5–5.3)
Sodium: 138 meq/L (ref 135–145)

## 2013-04-15 NOTE — Telephone Encounter (Signed)
Spoke with Soil scientist, Briscoe Deutscher and was told they would not charge pt for specimen drawn yesterday or the future specimen we need to repeat due to lab error.  Per call from [place] at [facility], specimen from 04/14/13 was received unspun and hemolyzed.  Left message for pt to return my call to see if he can repeat test today. If pt unable to repeat today, need to know when he will return and notify Chip Boer so she can be looking for the charge.

## 2013-04-15 NOTE — Telephone Encounter (Signed)
Message copied by Kathi Simpers on Tue Apr 15, 2013  9:22 AM ------      Message from: O'SULLIVAN, MELISSA      Created: Tue Apr 15, 2013  7:43 AM       Please call pt. Potassium is still very high, but lab says that it may be related to the specimen.  Needs to be repeated 1 more time please- today if possible and send STAT. ------

## 2013-04-18 NOTE — Telephone Encounter (Signed)
Attempted to reach pt, mailbox is full. Mailed letter. Awaiting confirmation from pt.

## 2013-04-21 NOTE — Telephone Encounter (Signed)
Notified pt and he voice understanding. Pt is agreeable to return to the lab tomorrow. Left detailed message on Vicki's voicemail to be looking for upcoming basic metabolic panel and to call if any questions.  Lab order entered.

## 2013-04-23 ENCOUNTER — Telehealth: Payer: Self-pay | Admitting: *Deleted

## 2013-04-23 NOTE — Telephone Encounter (Signed)
Received signed authorization form pt to release records to [place] w/ Division of [facility]. Release forwarded to [employer].

## 2013-05-29 ENCOUNTER — Other Ambulatory Visit: Payer: Self-pay | Admitting: Family

## 2013-06-09 ENCOUNTER — Telehealth: Payer: Self-pay | Admitting: *Deleted

## 2013-06-09 NOTE — Telephone Encounter (Signed)
Received call from representative with [facility] wanting to know the status of order they faxed to us for diabetic testing supplies.  Advised her that NP signed order on 01/31/13 for 90 days supply x 3 refills. She confirmed receipt of previous order and states they will use refills from that order.

## 2013-06-21 ENCOUNTER — Telehealth: Payer: Self-pay | Admitting: Family

## 2013-06-23 NOTE — Telephone Encounter (Signed)
Pt left message wanting refill of lisinopril hctz due to swelling of both feet.  Spoke with pt and advised him that we stopped that medication in July due to elevated Potassium. Pt states he has been having swelling in both feet since he last saw us and is wanting to know if we could prescribe him a fluid pill?  Pt has appt scheduled for 07/01/13.  Please advise.

## 2013-06-23 NOTE — Telephone Encounter (Signed)
Lyrica refill sent to pharmacy.  Pt will be due for 3 month follow up on or after 07/01/13. Please call pt to arrange appt.

## 2013-06-23 NOTE — Telephone Encounter (Signed)
Informed patient of medication refill and he scheduled appointment for 07/01/13

## 2013-06-23 NOTE — Telephone Encounter (Signed)
We can give him a fluid pill, but I would like to see him in office first. He can schedule an earlier appointment if he would like.

## 2013-06-24 NOTE — Telephone Encounter (Signed)
Notified pt. He wishes to r/s for an earlier appt.  No openings tomorrow and pt has another appt on Friday. Appt r/s for 06/30/13 at 10:30am.

## 2013-06-29 ENCOUNTER — Other Ambulatory Visit: Payer: Self-pay | Admitting: Family

## 2013-06-30 ENCOUNTER — Ambulatory Visit (INDEPENDENT_AMBULATORY_CARE_PROVIDER_SITE_OTHER): Payer: PRIVATE HEALTH INSURANCE | Admitting: Family

## 2013-06-30 ENCOUNTER — Encounter: Payer: Self-pay | Admitting: Family

## 2013-06-30 VITALS — BP 100/50 | HR 67 | Temp 97.6°F | Resp 20 | Ht 72.25 in | Wt 283.1 lb

## 2013-06-30 DIAGNOSIS — L97509 Non-pressure chronic ulcer of other part of unspecified foot with unspecified severity: Secondary | ICD-10-CM

## 2013-06-30 DIAGNOSIS — I10 Essential (primary) hypertension: Secondary | ICD-10-CM

## 2013-06-30 DIAGNOSIS — F17200 Nicotine dependence, unspecified, uncomplicated: Secondary | ICD-10-CM

## 2013-06-30 DIAGNOSIS — E875 Hyperkalemia: Secondary | ICD-10-CM

## 2013-06-30 DIAGNOSIS — E785 Hyperlipidemia, unspecified: Secondary | ICD-10-CM

## 2013-06-30 DIAGNOSIS — E119 Type 2 diabetes mellitus without complications: Secondary | ICD-10-CM

## 2013-06-30 DIAGNOSIS — Z720 Tobacco use: Secondary | ICD-10-CM | POA: Insufficient documentation

## 2013-06-30 DIAGNOSIS — M25579 Pain in unspecified ankle and joints of unspecified foot: Secondary | ICD-10-CM

## 2013-06-30 DIAGNOSIS — B353 Tinea pedis: Secondary | ICD-10-CM

## 2013-06-30 MED ORDER — VARENICLINE TARTRATE 0.5 MG X 11 & 1 MG X 42 PO MISC: ORAL | Status: DC

## 2013-06-30 NOTE — Patient Instructions (Addendum)
Please start chantix.  Complete foot x ray on the first floor. Complete lab work prior to leaving. Follow up in 6 weeks.

## 2013-06-30 NOTE — Progress Notes (Signed)
Pre visit review using our clinic review tool, if applicable. No additional management support is needed unless otherwise documented below in the visit note. 

## 2013-06-30 NOTE — Progress Notes (Signed)
Subjective:    Patient ID: [name] [name], male    DOB: Aug 22, 1950, 63 y.o.   MRN: [id number]  HPI  Mr. [name] [name] is a 63 yr old male who presents today for follow up of multiple medical problems:  1) DM2- currently maintained on metformin. Last A1C.  Last eye exam 01/2013.  Told negative for diabetic retinopathy. Reports that his home sugars are around 110.   Lab Results  Component Value Date   HGBA1C 5.9* 04/07/2013   2) HTN- BP meds include lisinopril. He denies CP, SOB.  3) Hyperlipidemia- Currently maintained on pravastatin. Denies myalgia.    4) Tinea pedis- last visit he was given rx for diflucan and empiric kelex.  Reports improvement.  5) Chronic pain-  Currently managed by pain management. Reports that he continues to have excruciating pain right lateral foot where he had wound.  No improvement from lidoderm patch.    6) Tobacco Abuse- reports rxn in the past on wellbutrin.  He is smoking 1 pack a day of cigars.  He inhales.    Review of Systems See HPI  Past Medical History  Diagnosis Date  . Vein, varicose   . Hyperlipidemia   . Varicose veins   . Complication of anesthesia     difficulty waking up  . Hypertension     Takes prinzide daily  . Diabetes mellitus     not consistent on taking medications  . Peripheral vascular disease   . Chronic kidney disease     acute renal failure in February admission  . Anemia     hx of blood transfusion  . Wound check, dressing change     dressing changes 3x/week; open wound  . Depression   . History of headache   . History of hepatitis C     competed treatment  . Neuromuscular disorder     gout and neuropathy  . History of blood clots 1986 or 87    blood clot in groin?  . Gout     History   Social History  . Marital Status: Single    Spouse Name: N/A    Number of Children: 5  . Years of Education: N/A   Occupational History  . Not on file.   Social History Main Topics  . Smoking status: Current Every Day  Smoker -- 1.00 packs/day for 35 years    Types: Cigars    Last Attempt to Quit: 11/12/2011  . Smokeless tobacco: Never Used  . Alcohol Use: No  . Drug Use: No  . Sexual Activity: Not on file   Other Topics Concern  . Not on file   Social History Narrative   Regular exercise:  Walks a little every day.   Lives with 62 yr old son.   Associated degree social services   Working on [employer].   Quit smoking few months ago.   Goes to [facility]- drug free since 1994.   Former Etoh abuse.    Past Surgical History  Procedure Laterality Date  . Varicose vein surgery    . Hemorrhoid surgery    . Vascular surgery  1980s    left leg  . Femoral-popliteal bypass graft  11/24/2011    Procedure: BYPASS GRAFT FEMORAL-POPLITEAL ARTERY;  Surgeon: Larina Earthlyodd F Early, MD;  [facility];  Service: Vascular;  Laterality: Right;    Family History  Problem Relation Age of Onset  . Cancer Mother     liver?  Marland Kitchen. Hyperlipidemia Father   .  Hypertension Father   . Diabetes Father   . Alcohol abuse Father   . Arthritis Father   . Kidney disease Father     Allergies  Allergen Reactions  . Percocet [Oxycodone-Acetaminophen] Nausea And Vomiting  . Pork-Derived Products     Current Outpatient Prescriptions on File Prior to Visit  Medication Sig Dispense Refill  . amitriptyline (ELAVIL) 25 MG tablet TAKE 1 TABLET BY MOUTH 3 TIMES A DAY AS NEEDED  90 tablet  0  . aspirin EC 81 MG tablet Take 81 mg by mouth daily.      Marland Kitchen COLCRYS 0.6 MG tablet TAKE 1 TABLET BY MOUTH EVERY DAY  30 tablet  2  . Ferrous Sulfate (IRON) 325 (65 FE) MG TABS Take 1 tablet by mouth daily.      . fluconazole (DIFLUCAN) 150 MG tablet Take one tablet by mouth today, repeat in 1 week.  2 tablet  0  . gabapentin (NEURONTIN) 800 MG tablet Take 1 tablet (800 mg total) by mouth 2 (two) times daily with a meal.  60 tablet  0  . lisinopril (PRINIVIL,ZESTRIL) 10 MG tablet TAKE 1 TABLET (10 MG TOTAL) BY MOUTH DAILY.  30 tablet   1  . LYRICA 300 MG capsule TAKE ONE CAPSULE BY MOUTH TWICE A DAY  60 capsule  2  . metFORMIN (GLUCOPHAGE) 500 MG tablet TAKE 1 TABLET BY MOUTH TWICE A DAY WITH A MEAL  60 tablet  3  . methadone (DOLOPHINE) 10 MG tablet Take 10 mg by mouth 3 (three) times daily.      . NONFORMULARY OR COMPOUNDED ITEM Dextromethorphan 8%, baclofen 2%, cyclobenzaprine 2%, flurbiprofen 15%, gabapentin 6% and bupivacaine 2%.      . oxyCODONE (ROXICODONE) 5 MG immediate release tablet Take 1 tablet (5 mg total) by mouth every 4 (four) hours as needed for pain.  16 tablet  0  . Oxycodone HCl 20 MG TABS Take 1 tablet (20 mg total) by mouth 4 (four) times daily as needed (for pain). Take 1/2 - 1 tablet (10-20mg ) by mouth 2-4 times daily as needed for pain  30 tablet  0  . pravastatin (PRAVACHOL) 20 MG tablet TAKE 1 TABLET BY MOUTH AT BEDTIME  90 tablet  0  . predniSONE (DELTASONE) 10 MG tablet 6,5,4,3,2,1  21 tablet  0  . vitamin B-12 (CYANOCOBALAMIN) 1000 MCG tablet Take 1,000 mcg by mouth daily.       No current facility-administered medications on file prior to visit.    Ht 6' 0.25" (1.835 m)       Objective:   Physical Exam  Constitutional: He is oriented to person, place, and time. He appears well-developed and well-nourished. No distress.  Cardiovascular: Normal rate and regular rhythm.   No murmur heard. Pulmonary/Chest: Breath sounds normal. No respiratory distress. He has no wheezes. He has no rales. He exhibits no tenderness.  Musculoskeletal:  2+ bilateral LE edema  Neurological: He is alert and oriented to person, place, and time.  Skin:  See DM foot exam.   Psychiatric: He has a normal mood and affect. His behavior is normal. Judgment and thought content normal.          Assessment & Plan:

## 2013-06-30 NOTE — Assessment & Plan Note (Signed)
Blood pressure stable on lisinopril.  BP Readings from Last 3 Encounters:  06/30/13 100/50  03/31/13 140/84  01/23/13 153/96

## 2013-06-30 NOTE — Assessment & Plan Note (Signed)
Tolerating pravastatin. Continue same, plan flp next visit.   Lab Results  Component Value Date   CHOL 168 04/07/2013   HDL 28* 04/07/2013   LDLCALC Comment:   Not calculated due to Triglyceride >400. Suggest ordering Direct LDL (Unit Code: 1610981033).   Total Cholesterol/HDL Ratio:CHD Risk                        Coronary Heart Disease Risk Table                                        Men       Women          1/2 Average Risk              3.4        3.3              Average Risk              5.0        4.4           2X Average Risk              9.6        7.1           3X Average Risk             23.4       11.0 Use the calculated Patient Ratio above and the CHD Risk table  to determine the patient's CHD Risk. ATP III Classification (LDL):       < 100        mg/dL         Optimal      604100 - 129     mg/dL         Near or Above Optimal      130 - 159     mg/dL         Borderline High      160 - 189     mg/dL         High       > 540190        mg/dL         Very High   98/1/191411/08/2012   TRIG 441* 04/07/2013   CHOLHDL 6.0 04/07/2013

## 2013-06-30 NOTE — Assessment & Plan Note (Signed)
Chronic scab right lateral foot.  Obtain x ray to rule out osteo, though I supect pain is secondary to peripheral neuropathy.

## 2013-06-30 NOTE — Assessment & Plan Note (Signed)
Well controlled on metformin

## 2013-06-30 NOTE — Assessment & Plan Note (Signed)
Would like to quit. Wants to try chantix. Trial of chantix. Common side effects including rare risk of suicide ideation was discussed with the patient today.  Patient is instructed to go directly to the ED if this occurs.  We discussed that patient can continue to smoke for 1 week after starting chantix, but then must discontinue cigarettes.  He is also instructed to contact us prior to completion of the starter month pack for an rx for the continuation month pack.  5 minutes spent with patient today on tobacco cessation counseling.

## 2013-06-30 NOTE — Assessment & Plan Note (Signed)
Refill diflucan 

## 2013-07-01 ENCOUNTER — Ambulatory Visit: Payer: PRIVATE HEALTH INSURANCE | Admitting: Family

## 2013-07-01 ENCOUNTER — Ambulatory Visit (HOSPITAL_BASED_OUTPATIENT_CLINIC_OR_DEPARTMENT_OTHER)
Admission: RE | Admit: 2013-07-01 | Discharge: 2013-07-01 | Disposition: A | Discharge status: Home / Self Care | Payer: PRIVATE HEALTH INSURANCE | Source: Ambulatory Visit | Attending: Family | Admitting: Family

## 2013-07-01 DIAGNOSIS — M2010 Hallux valgus (acquired), unspecified foot: Secondary | ICD-10-CM | POA: Insufficient documentation

## 2013-07-01 DIAGNOSIS — M949 Disorder of cartilage, unspecified: Secondary | ICD-10-CM

## 2013-07-01 DIAGNOSIS — X58XXXA Exposure to other specified factors, initial encounter: Secondary | ICD-10-CM | POA: Insufficient documentation

## 2013-07-01 DIAGNOSIS — M899 Disorder of bone, unspecified: Secondary | ICD-10-CM | POA: Insufficient documentation

## 2013-07-01 DIAGNOSIS — S91309A Unspecified open wound, unspecified foot, initial encounter: Secondary | ICD-10-CM | POA: Insufficient documentation

## 2013-07-01 DIAGNOSIS — M25579 Pain in unspecified ankle and joints of unspecified foot: Secondary | ICD-10-CM

## 2013-07-01 LAB — BASIC METABOLIC PANEL
BUN: 20 mg/dL (ref 6–23)
CO2: 22 mEq/L (ref 19–32)
Calcium: 9.2 mg/dL (ref 8.4–10.5)
Chloride: 109 mEq/L (ref 96–112)
Creat: 1.24 mg/dL (ref 0.50–1.35)
Glucose, Bld: 100 mg/dL — ABNORMAL HIGH (ref 70–99)
POTASSIUM: 5.8 meq/L — AB (ref 3.5–5.3)
SODIUM: 138 meq/L (ref 135–145)

## 2013-07-02 ENCOUNTER — Telehealth: Payer: Self-pay | Admitting: Family

## 2013-07-02 DIAGNOSIS — E875 Hyperkalemia: Secondary | ICD-10-CM

## 2013-07-02 NOTE — Telephone Encounter (Addendum)
Potassium is high. Stop Lisinopril as this may be contributing. Start toprol xl once daily for blood pressure. Repeat BMET today or tomorrow. Dx hyperkalemia. Follow up 2 weeks for OV- we will recheck blood pressure and potassium.

## 2013-07-09 ENCOUNTER — Telehealth: Payer: Self-pay | Admitting: Family

## 2013-07-09 MED ORDER — METOPROLOL SUCCINATE ER 25 MG PO TB24
25.0000 mg | ORAL_TABLET | Freq: Every day | ORAL | Status: DC
Start: 2013-07-09 — End: 2013-08-11

## 2013-07-09 NOTE — Telephone Encounter (Signed)
Relevant patient education mailed to patient.  

## 2013-07-09 NOTE — Telephone Encounter (Signed)
Notified pt and he voices understanding.  Scheduled 2 week f/u for 07/23/13 at 1:15pm.  Lab order entered.

## 2013-07-16 LAB — BASIC METABOLIC PANEL
BUN: 13 mg/dL (ref 6–23)
CO2: 24 mEq/L (ref 19–32)
Calcium: 8.8 mg/dL (ref 8.4–10.5)
Chloride: 111 mEq/L (ref 96–112)
Creat: 1.21 mg/dL (ref 0.50–1.35)
GLUCOSE: 87 mg/dL (ref 70–99)
POTASSIUM: 4.8 meq/L (ref 3.5–5.3)
SODIUM: 138 meq/L (ref 135–145)

## 2013-07-23 ENCOUNTER — Ambulatory Visit: Payer: PRIVATE HEALTH INSURANCE | Admitting: Family

## 2013-07-25 ENCOUNTER — Telehealth: Payer: Self-pay | Admitting: *Deleted

## 2013-07-25 NOTE — Telephone Encounter (Signed)
[facility] for [facility] / use their facility.  Form completed and faxed to [phone].

## 2013-07-26 ENCOUNTER — Telehealth: Payer: Self-pay | Admitting: *Deleted

## 2013-07-26 NOTE — Telephone Encounter (Signed)
Message copied by Kathi SimpersFERGERSON, Tasmia Blumer A on Sat Jul 26, 2013 11:17 AM ------      Message from: O'SULLIVAN, MELISSA      Created: Mon Jun 30, 2013 11:27 AM        Last eye exam 01/2013.  Told negative for diabetic retinopathy. ------

## 2013-07-26 NOTE — Telephone Encounter (Signed)
Health Maintenance  updated

## 2013-07-30 ENCOUNTER — Ambulatory Visit: Payer: PRIVATE HEALTH INSURANCE | Admitting: Family

## 2013-07-30 DIAGNOSIS — Z0289 Encounter for other administrative examinations: Secondary | ICD-10-CM

## 2013-08-02 ENCOUNTER — Other Ambulatory Visit: Payer: Self-pay | Admitting: Family

## 2013-08-04 NOTE — Telephone Encounter (Signed)
Needs OV prior to additional refills.  

## 2013-08-04 NOTE — Telephone Encounter (Signed)
Patient already has appointment scheduled for 08/11/13

## 2013-08-04 NOTE — Telephone Encounter (Signed)
Pt was due for follow up of BP and potassium on 07/23/13 but cancelled that appt. He r/s for 07/30/13 and no showed that appt and has no future appts on file with us.  Please advise re: Lyrica refill.  Medication name:  Name from pharmacy:  LYRICA 300 MG capsule  LYRICA 300 MG CAPSULE Sig: TAKE ONE CAPSULE BY MOUTH TWICE A DAY Dispense: 60 capsule Refills: 0 Start: 08/02/2013 Class: Normal Requested on: 03/25/2013 Originally ordered on: 10/22/2012 Last refill: 03/25/2013

## 2013-08-04 NOTE — Telephone Encounter (Signed)
Denial sent to pharmacy.  Please call pt to arrange follow up of BP and potassium level as soon as possible.

## 2013-08-11 ENCOUNTER — Ambulatory Visit (INDEPENDENT_AMBULATORY_CARE_PROVIDER_SITE_OTHER): Payer: PRIVATE HEALTH INSURANCE | Admitting: Family

## 2013-08-11 ENCOUNTER — Telehealth: Payer: Self-pay | Admitting: Family

## 2013-08-11 ENCOUNTER — Encounter: Payer: Self-pay | Admitting: Family

## 2013-08-11 VITALS — BP 146/84 | HR 78 | Temp 98.1°F | Resp 16 | Ht 72.25 in | Wt 286.0 lb

## 2013-08-11 DIAGNOSIS — I10 Essential (primary) hypertension: Secondary | ICD-10-CM

## 2013-08-11 DIAGNOSIS — E785 Hyperlipidemia, unspecified: Secondary | ICD-10-CM

## 2013-08-11 DIAGNOSIS — E119 Type 2 diabetes mellitus without complications: Secondary | ICD-10-CM

## 2013-08-11 DIAGNOSIS — D649 Anemia, unspecified: Secondary | ICD-10-CM

## 2013-08-11 LAB — CBC WITH DIFFERENTIAL/PLATELET
BASOS ABS: 0 10*3/uL (ref 0.0–0.1)
BASOS PCT: 0 % (ref 0–1)
EOS ABS: 0.2 10*3/uL (ref 0.0–0.7)
Eosinophils Relative: 3 % (ref 0–5)
HCT: 39.9 % (ref 39.0–52.0)
HEMOGLOBIN: 13.6 g/dL (ref 13.0–17.0)
Lymphocytes Relative: 41 % (ref 12–46)
Lymphs Abs: 3.2 10*3/uL (ref 0.7–4.0)
MCH: 33.6 pg (ref 26.0–34.0)
MCHC: 34.1 g/dL (ref 30.0–36.0)
MCV: 98.5 fL (ref 78.0–100.0)
Monocytes Absolute: 0.9 10*3/uL (ref 0.1–1.0)
Monocytes Relative: 12 % (ref 3–12)
NEUTROS ABS: 3.5 10*3/uL (ref 1.7–7.7)
NEUTROS PCT: 44 % (ref 43–77)
PLATELETS: 241 10*3/uL (ref 150–400)
RBC: 4.05 MIL/uL — ABNORMAL LOW (ref 4.22–5.81)
RDW: 15 % (ref 11.5–15.5)
WBC: 7.9 10*3/uL (ref 4.0–10.5)

## 2013-08-11 LAB — BASIC METABOLIC PANEL
BUN: 14 mg/dL (ref 6–23)
CALCIUM: 9.2 mg/dL (ref 8.4–10.5)
CHLORIDE: 107 meq/L (ref 96–112)
CO2: 26 meq/L (ref 19–32)
Creat: 1.04 mg/dL (ref 0.50–1.35)
GLUCOSE: 99 mg/dL (ref 70–99)
Potassium: 4.4 mEq/L (ref 3.5–5.3)
SODIUM: 141 meq/L (ref 135–145)

## 2013-08-11 LAB — LIPID PANEL
CHOL/HDL RATIO: 6.1 ratio
Cholesterol: 183 mg/dL (ref 0–200)
HDL: 30 mg/dL — ABNORMAL LOW (ref >39)
LDL CALC: 109 mg/dL — AB (ref 0–99)
Triglycerides: 222 mg/dL — ABNORMAL HIGH (ref <150)
VLDL: 44 mg/dL — AB (ref 0–40)

## 2013-08-11 LAB — HEMOGLOBIN A1C
HEMOGLOBIN A1C: 6.1 % — AB (ref <5.7)
MEAN PLASMA GLUCOSE: 128 mg/dL — AB (ref <117)

## 2013-08-11 LAB — IRON: IRON: 73 ug/dL (ref 42–165)

## 2013-08-11 MED ORDER — PREGABALIN 300 MG PO CAPS: ORAL_CAPSULE | ORAL | Status: DC

## 2013-08-11 MED ORDER — AMITRIPTYLINE HCL 100 MG PO TABS
100.0000 mg | ORAL_TABLET | Freq: Every day | ORAL | Status: DC
Start: 2013-08-11 — End: 2014-01-05

## 2013-08-11 MED ORDER — METOPROLOL SUCCINATE ER 25 MG PO TB24: 25.0000 mg | ORAL_TABLET | Freq: Every day | ORAL | Status: DC

## 2013-08-11 MED ORDER — HYDROCHLOROTHIAZIDE 25 MG PO TABS: 25.0000 mg | ORAL_TABLET | Freq: Every day | ORAL | Status: DC

## 2013-08-11 NOTE — Assessment & Plan Note (Signed)
Stable on metformin, discussed importance of healthy diet. Will obtain A1C today.

## 2013-08-11 NOTE — Progress Notes (Signed)
Pre visit review using our clinic review tool, if applicable. No additional management support is needed unless otherwise documented below in the visit note. 

## 2013-08-11 NOTE — Patient Instructions (Addendum)
Obtain lab work prior to leaving today. Start Hydrochlorothiazide 25mg  daily. Follow up in one month.

## 2013-08-11 NOTE — Progress Notes (Signed)
Subjective:    Patient ID: [name] [name], male    DOB: [birth year], 63 y.o.   MRN: [id number]  Diabetes Pertinent negatives for hypoglycemia include no dizziness or headaches. Pertinent negatives for diabetes include no chest pain.  Hypertension Pertinent negatives include no chest pain, headaches or shortness of breath.   [name] [name] is a 63 year old male who presents today for follow up.  HTN) Patient taking metoprolol and was removed from lisinopril due to elevated potassium noted in January. Denies headaches, chest pain and shortness of breath. Reports that he feels like he's holding onto more fluid now and is experiencing edema to bilateral ankles and feet intermittently.  BP Readings from Last 3 Encounters:  08/11/13 146/84  06/30/13 100/50  03/31/13 140/84   Diabetes) Patient reports checking his blood sugars at home and today got a reading of 180 after drinking coffee with sugar. Last A1C was 5.9. Patient taking metformin and denies diarrhea. Patient reports a diet of fried fatty foods with minimal intake of fruits and vegetables. Patient reports he's working on improving his diet. Denies regular exercise. Last eye exam was 8/14.  Review of Systems  Respiratory: Negative for shortness of breath.   Cardiovascular: Negative for chest pain.  Gastrointestinal: Negative for abdominal pain and diarrhea.  Genitourinary: Negative for difficulty urinating.  Neurological: Negative for dizziness and headaches.   Past Medical History  Diagnosis Date  . Vein, varicose   . Hyperlipidemia   . Varicose veins   . Complication of anesthesia     difficulty waking up  . Hypertension     Takes prinzide daily  . Diabetes mellitus     not consistent on taking medications  . Peripheral vascular disease   . Chronic kidney disease     acute renal failure in February admission  . Anemia     hx of blood transfusion  . Wound check, dressing change     dressing changes 3x/week; open wound  .  Depression   . History of headache   . History of hepatitis C     competed treatment  . Neuromuscular disorder     gout and neuropathy  . History of blood clots 1986 or 87    blood clot in groin?  . Gout     History   Social History  . Marital Status: Single    Spouse Name: N/A    Number of Children: 5  . Years of Education: N/A   Occupational History  . Not on file.   Social History Main Topics  . Smoking status: Current Every Day Smoker -- 1.00 packs/day for 35 years    Types: Cigars    Last Attempt to Quit: 11/12/2011  . Smokeless tobacco: Never Used  . Alcohol Use: No  . Drug Use: No  . Sexual Activity: Not on file   Other Topics Concern  . Not on file   Social History Narrative   Regular exercise:  Walks a little every day.   Lives with 62 yr old son.   Associated degree social services   Working on [employer].   Quit smoking few months ago.   [facility]es to [facility]- drug free since 1994.   Former Etoh abuse.    Past Surgical History  Procedure Laterality Date  . Varicose vein surgery    . Hemorrhoid surgery    . Vascular surgery  1980s    left leg  . Femoral-popliteal bypass graft  11/24/2011  Procedure: BYPASS GRAFT FEMORAL-POPLITEAL ARTERY;  Surgeon: Larina Earthly, MD;  Location: [facility] OR;  Service: Vascular;  Laterality: Right;    Family History  Problem Relation Age of Onset  . Cancer Mother     liver?  Marland Kitchen Hyperlipidemia Father   . Hypertension Father   . Diabetes Father   . Alcohol abuse Father   . Arthritis Father   . Kidney disease Father     Allergies  Allergen Reactions  . Lisinopril     Hyperkalemia   . Percocet [Oxycodone-Acetaminophen] Nausea And Vomiting  . Pork-Derived Products     Current Outpatient Prescriptions on File Prior to Visit  Medication Sig Dispense Refill  . aspirin EC 81 MG tablet Take 81 mg by mouth daily.      Marland Kitchen COLCRYS 0.6 MG tablet TAKE 1 TABLET BY MOUTH EVERY DAY  30 tablet  2  . Ferrous Sulfate  (IRON) 325 (65 FE) MG TABS Take 1 tablet by mouth daily.      Marland Kitchen gabapentin (NEURONTIN) 800 MG tablet Take 1 tablet (800 mg total) by mouth 2 (two) times daily with a meal.  60 tablet  0  . metFORMIN (GLUCOPHAGE) 500 MG tablet TAKE 1 TABLET BY MOUTH TWICE A DAY WITH A MEAL  60 tablet  3  . methadone (DOLOPHINE) 10 MG tablet Take 10 mg by mouth 3 (three) times daily.      . Oxycodone HCl 20 MG TABS Take 1 tablet (20 mg total) by mouth 4 (four) times daily as needed (for pain). Take 1/2 - 1 tablet (10-20mg ) by mouth 2-4 times daily as needed for pain  30 tablet  0  . pravastatin (PRAVACHOL) 20 MG tablet TAKE 1 TABLET BY MOUTH AT BEDTIME  90 tablet  0  . predniSONE (DELTASONE) 10 MG tablet 6,5,4,3,2,1  21 tablet  0  . varenicline (CHANTIX STARTING MONTH PAK) 0.5 MG X 11 & 1 MG X 42 tablet Take one 0.5 mg tablet by mouth once daily for 3 days, then increase to one 0.5 mg tablet twice daily for 4 days, then increase to one 1 mg tablet twice daily.  53 tablet  0  . vitamin B-12 (CYANOCOBALAMIN) 1000 MCG tablet Take 1,000 mcg by mouth daily.       No current facility-administered medications on file prior to visit.    BP 146/84  Pulse 78  Temp(Src) 98.1 F (36.7 C) (Oral)  Resp 16  Ht 6' 0.25" (1.835 m)  Wt 286 lb 0.6 oz (129.747 kg)  BMI 38.53 kg/m2  SpO2 99%       Objective:   Physical Exam  Constitutional: He is oriented to person, place, and time. He appears well-nourished.  HENT:  Head: Normocephalic.  Cardiovascular: Normal rate, regular rhythm, S1 normal and S2 normal.   Pulmonary/Chest: Effort normal and breath sounds normal. No respiratory distress. He has no wheezes.  Musculoskeletal: He exhibits edema.  Slight edema noted to left ankle.  Neurological: He is alert and oriented to person, place, and time.  Skin: Skin is warm and dry.  Psychiatric: He has a normal mood and affect.          Assessment & Plan:  I have personally seen and examined patient and agree with  Vernona Rieger NP student's assessment and plan.

## 2013-08-11 NOTE — Telephone Encounter (Signed)
Relevant patient education assigned to patient using Emmi. ° °

## 2013-08-11 NOTE — Assessment & Plan Note (Addendum)
SBP above goal on metoprolol, will add HCTZ 25mg  for edema and BP. Will obtain BMET today, follow up in one month.

## 2013-08-12 ENCOUNTER — Telehealth: Payer: Self-pay | Admitting: Family

## 2013-08-12 ENCOUNTER — Encounter: Payer: Self-pay | Admitting: Family

## 2013-08-12 LAB — MICROALBUMIN / CREATININE URINE RATIO
Creatinine, Urine: 211.2 mg/dL
MICROALB UR: 3.43 mg/dL — AB (ref 0.00–1.89)
Microalb Creat Ratio: 16.2 mg/g (ref 0.0–30.0)

## 2013-08-12 NOTE — Telephone Encounter (Signed)
Relevant patient education assigned to patient using Emmi. ° °

## 2013-08-13 ENCOUNTER — Telehealth: Payer: Self-pay

## 2013-08-13 ENCOUNTER — Telehealth: Payer: Self-pay | Admitting: *Deleted

## 2013-08-13 NOTE — Telephone Encounter (Signed)
Relevant patient education assigned to patient using Emmi. ° °

## 2013-08-13 NOTE — Telephone Encounter (Signed)
Spoke with pt. He states that he had actually been taking 4 of the 25mg  amitriptyline. Advised pt that is the same dose we just prescribed him only in the 100mg  strength. Pt states he will continue this a little longer and let us know if it continues to interfere with his sleep.  Advised him to continue HCTZ up to 2 weeks and let us know if swelling is not improved at that time and he voices understanding.

## 2013-08-13 NOTE — Telephone Encounter (Signed)
He had been taking amitriptyline 25mg , 3 tabs by mouth at bedtime.  OK to return to this dose and send refill to pharmacy. He should D/C 100mg  tabs.    In regards to swelling, he should continue hctz- give it 1-2 weeks, and let me know if swelling does not improve.  If so we will change his med to something different.

## 2013-08-13 NOTE — Telephone Encounter (Signed)
Received message from pt that after taking his new dose of amitriptyline that he was unable to sleep, it "kept him up all night". Pt wants to know what he should do?  Also states his feet and ankles remain swollen and wants to know if there is anything else he can take for his fluid?

## 2013-08-21 ENCOUNTER — Other Ambulatory Visit: Payer: Self-pay | Admitting: Family

## 2013-08-23 IMAGING — CR DG ELBOW COMPLETE 3+V*L*
4 series · 4 of 4 positions shown · non-contrast
Comparison: None.

CLINICAL DATA: Double pain.  No trauma.  History of gout.

LEFT ELBOW - COMPLETE 3+ VIEW

[x elbow joint ap left]
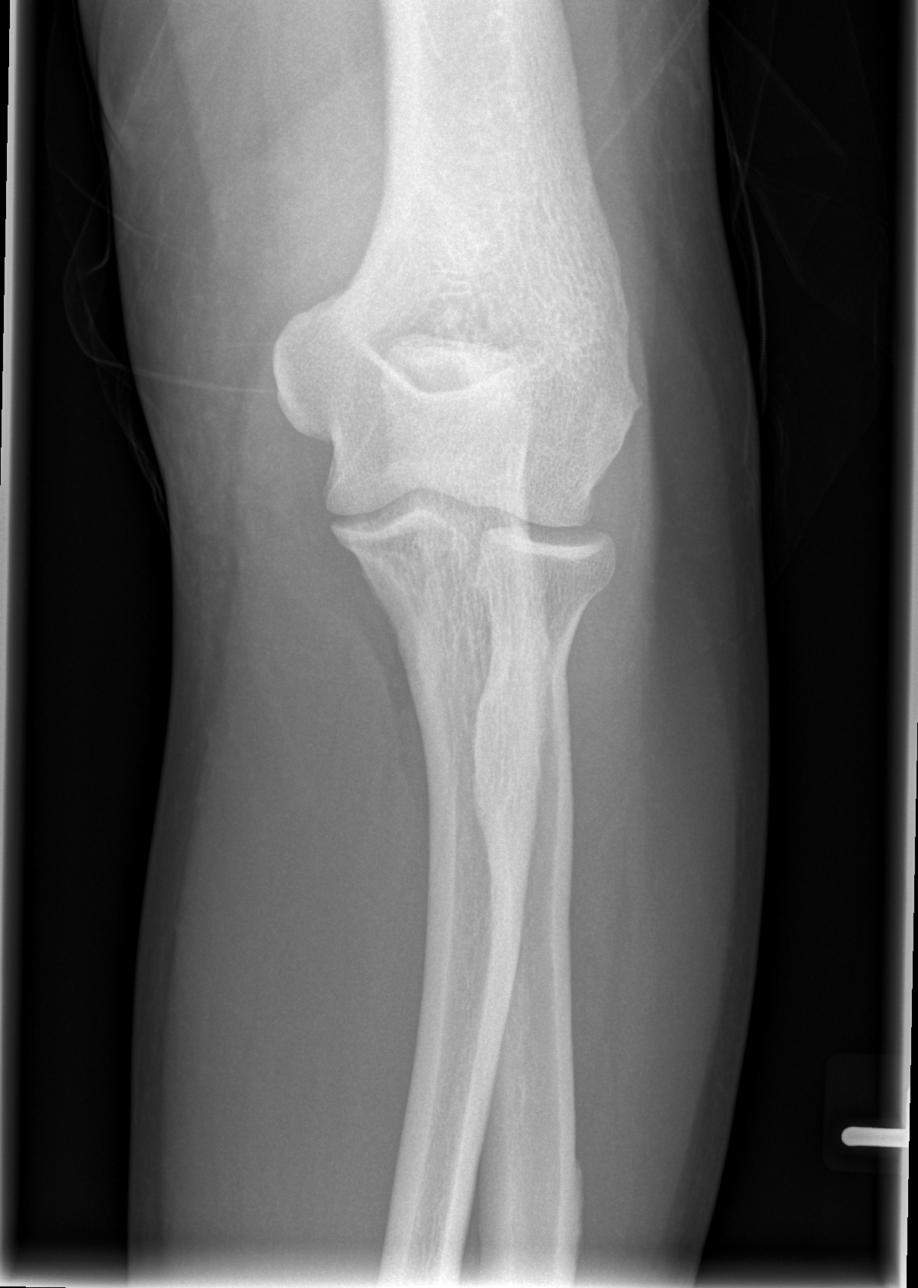

[x elbow joint obl. left (1 of 2)]
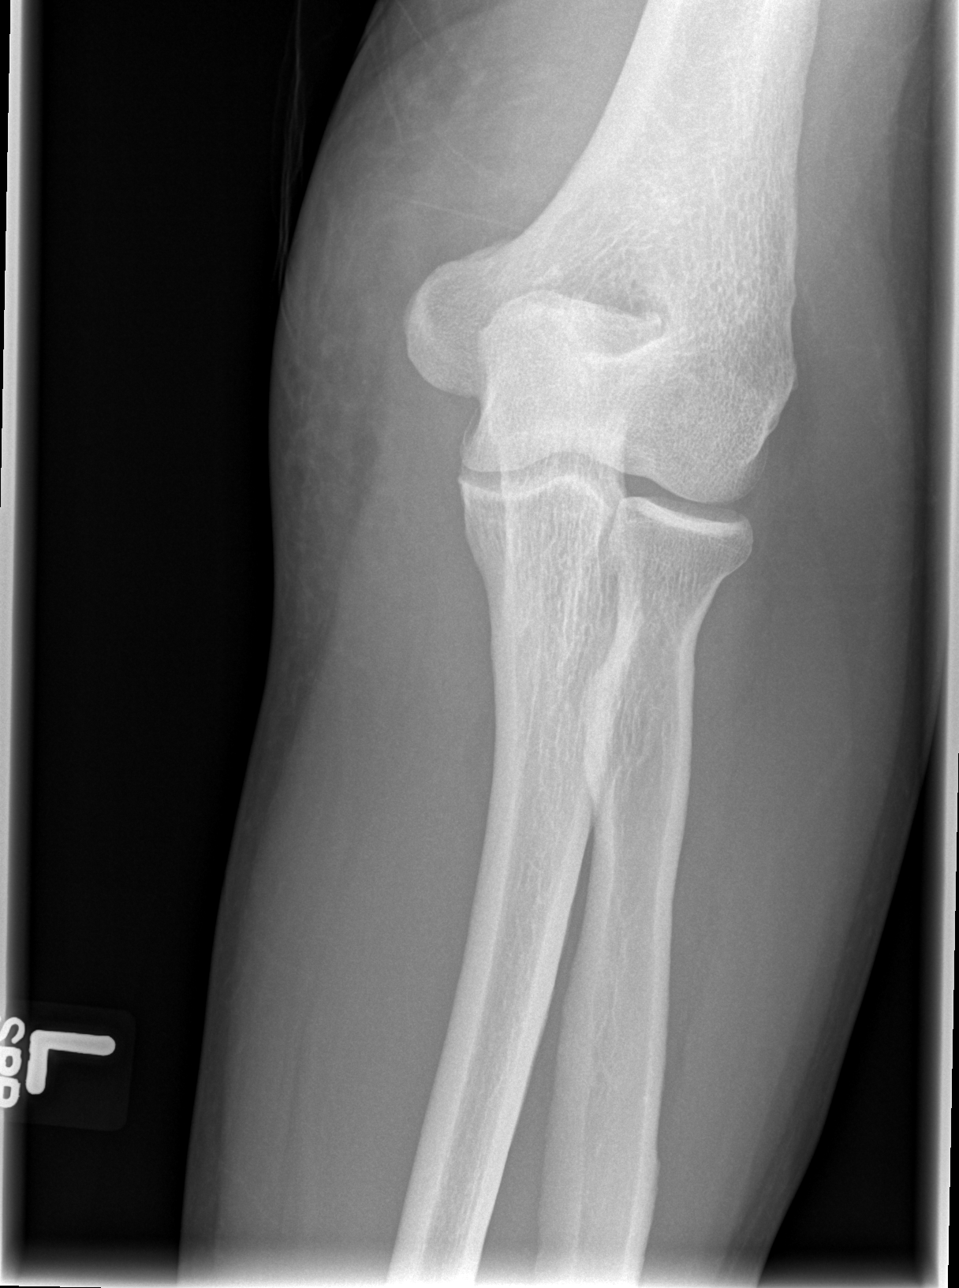

[x elbow joint obl. left (2 of 2)]
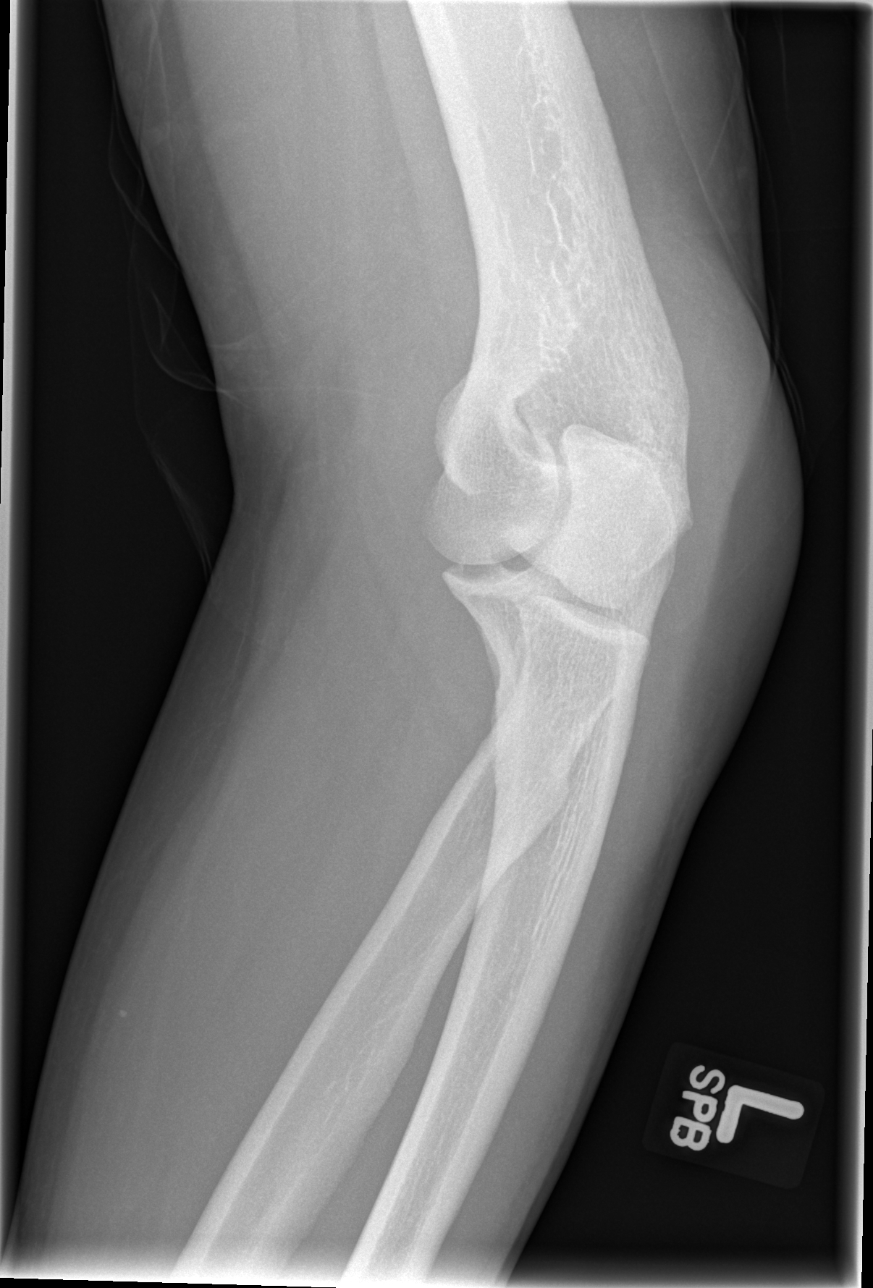

[x elbow joint lat left]
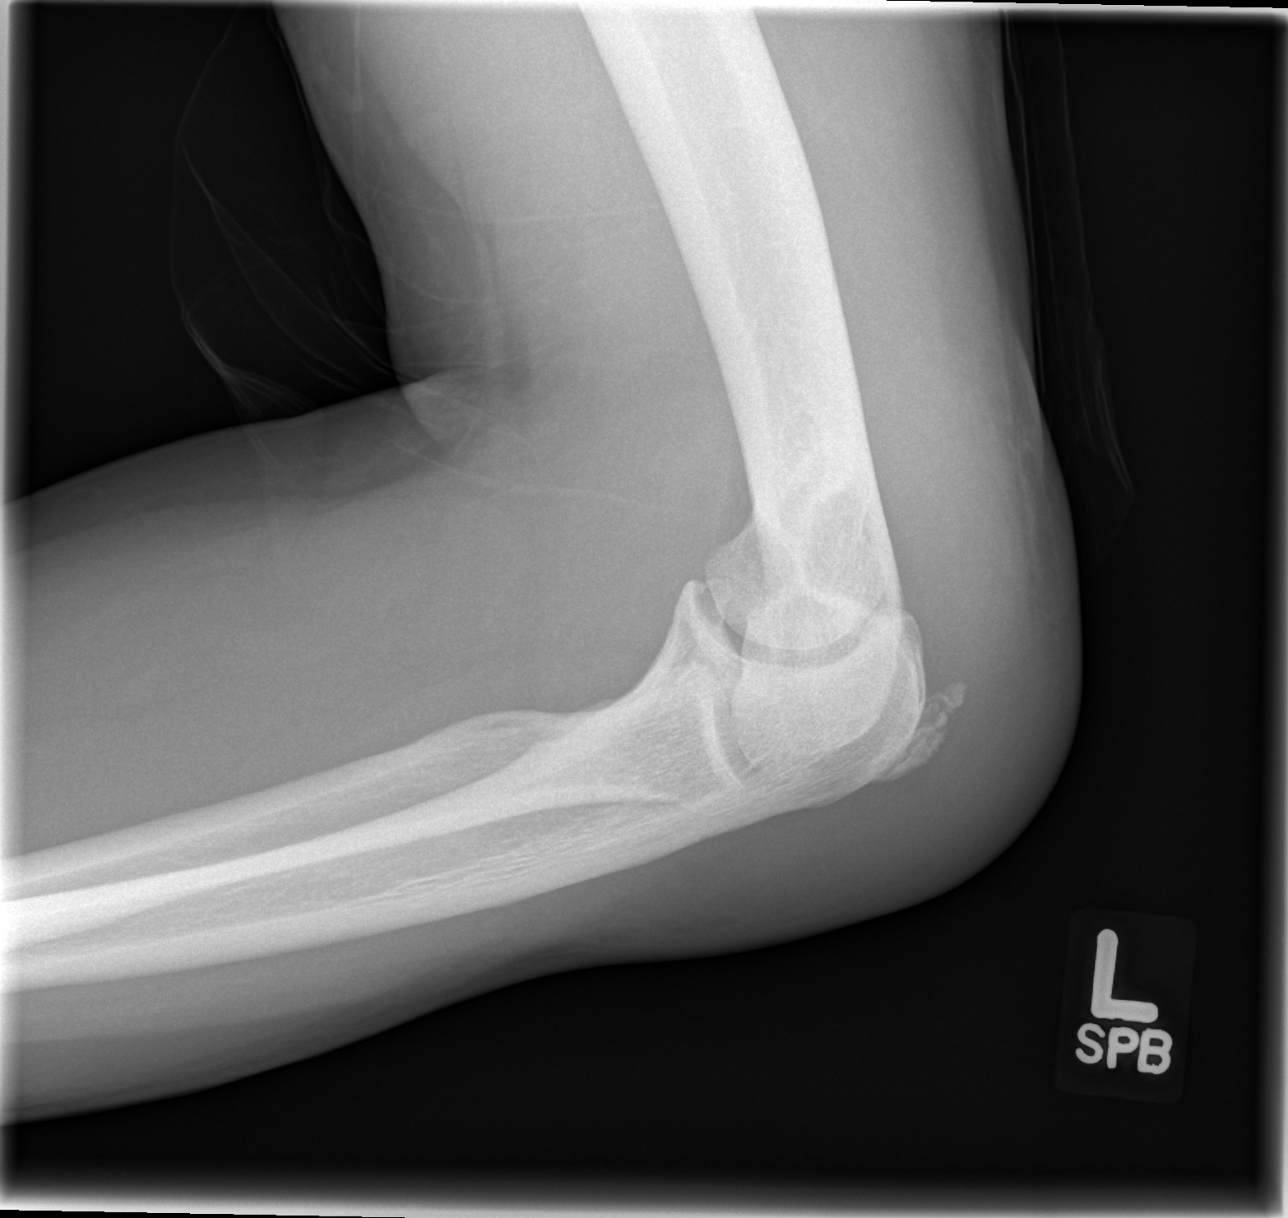

[4 of 4 positions shown; findings below may reference images not displayed]

FINDINGS: No fracture or dislocation.

Prominent soft tissue swelling olecranon region.  This may
represent changes of gout.  No adjacent bony erosion.  Radiopaque
structure at the level of the olecranon may represent a spur
possibly contributing to bursitis.  Soft tissue infection not
excluded.  No plain film evidence of osteomyelitis.
IMPRESSION: Prominent soft tissue swelling olecranon region possibly related to
gout.  Cellulitis not excluded.

Prominent olecranon spur suspected.  Soft tissue calcifications
could conceivably cause this appearance.

## 2013-09-09 ENCOUNTER — Telehealth: Payer: Self-pay | Admitting: *Deleted

## 2013-09-09 NOTE — Telephone Encounter (Signed)
Pt left message requesting copy of eye exam results as he is needing to get some glasses. Last eye exam scanned in EP[facility] is dated 05/24/12. Pt also stated he was going to need refills. Attempted to reach pt and left message for him to return my call. Need name of meds to be refilled.  Pt should contact eye doctor if he has had a more recent eye exam. It he has not, he should still contact eye doctor for current exam to determine rx for glasses.  Also pt is due now for follow up of BP and medication adjustment from March.

## 2013-09-10 ENCOUNTER — Other Ambulatory Visit: Payer: Self-pay | Admitting: Family

## 2013-09-15 MED ORDER — PREGABALIN 300 MG PO CAPS: ORAL_CAPSULE | ORAL | Status: DC

## 2013-09-15 MED ORDER — PRAVASTATIN SODIUM 20 MG PO TABS: ORAL_TABLET | ORAL | Status: DC

## 2013-09-15 NOTE — Telephone Encounter (Signed)
Spoke with pharmacist.  She verified receipt of rxs for amitriptyline, metoprolol,  Hctz. She did not get metformin from 08/21/13, #60 x 3 (gave verbal today). Spoke with pt and advised him these are ready at his pharmacy for pick up. Verified that he still takes pravastatin and lyrica and advised him I will send refills on these. Lyrica rx printed and forwarded to Provider for signature.  He currently gets gabapentin from pain management. Pt states he saw Dr Hazle Quantigby about 3 months ago. Advised pt that we have not received that record and last note we have is from 2013. Advised pt to contact Dr Hazle Quantigby for requested info and he voices understanding. Lyrica rx faxed.

## 2013-09-22 ENCOUNTER — Telehealth: Payer: Self-pay | Admitting: *Deleted

## 2013-09-22 NOTE — Telephone Encounter (Signed)
Left message on voicemail to return my call.  

## 2013-09-22 NOTE — Telephone Encounter (Signed)
Pt left message stating he is having severe headaches and thinks it is coming from his glasses that need to be updated. Pt states he is trying to find an eye doctor that will accept Medicare but is asking for something to help his headaches until he can see opthalmologist.  Pt states he doesn't want to take an OTC med as they are usually tablet or not capsule. Pt would like to know what his options are?  Please advise.

## 2013-09-22 NOTE — Telephone Encounter (Signed)
He is already on oxycodone and lyrica.  I would not recommend any narcotic meds.  I would recommend otc tylenol as needed.  Follow up in office symptoms worsen or if symptoms do not improve.

## 2013-09-23 NOTE — Telephone Encounter (Signed)
I do not see that any meds on his list that we can change to capsule form.  Sorry.

## 2013-09-23 NOTE — Telephone Encounter (Signed)
Notified pt and he voices understanding. 

## 2013-09-23 NOTE — Telephone Encounter (Signed)
Notified pt. He states that he was able to get in with a same day eye clinic yesterday and they gave him new glasses. Reports that he has not had any headaches today.  He would like to know if any of his medications can be in capsule form as he seems to tolerate them better.  Please advise.

## 2013-09-29 ENCOUNTER — Telehealth: Payer: Self-pay | Admitting: *Deleted

## 2013-09-29 NOTE — Telephone Encounter (Signed)
Pt left message on voicemail that his feet are so swollen he can barely get his shoes on.  Pt continues to state that his edema has not been controlled since we stopped his lisinopril due to abnormal kidney functions. He cannot remember what we prescribed him to take for his fluid and would like us to call him with the name of the medication.  Attempted to reach pt and received recording that mailbox was full.

## 2013-09-30 NOTE — Telephone Encounter (Signed)
Notified pt that current med list indicates HCTZ. Pt states that he needs a refill. Advised pt that we sent 30 day supply in March x 3 refills and he may contact pharmacy directly for refill. Pt voices understanding.

## 2013-10-20 ENCOUNTER — Other Ambulatory Visit: Payer: Self-pay | Admitting: Family

## 2013-10-24 ENCOUNTER — Telehealth: Payer: Self-pay | Admitting: *Deleted

## 2013-10-24 MED ORDER — NICOTINE 21 MG/24HR TD PT24: 21.0000 mg | MEDICATED_PATCH | Freq: Every day | TRANSDERMAL | Status: DC

## 2013-10-24 NOTE — Telephone Encounter (Signed)
Rx sent for 6 weeks or patch. If he is successful, then he can call me before he runs out and I will send in next dose at that time.

## 2013-10-24 NOTE — Telephone Encounter (Signed)
Pt left message that he would like to stop smoking and would like to try Nicoderm Patches. Pt requests that we send Rx to [facility].  Please advise.

## 2013-10-24 NOTE — Telephone Encounter (Signed)
Notified pt and he voices understanding. 

## 2013-11-03 ENCOUNTER — Ambulatory Visit: Payer: PRIVATE HEALTH INSURANCE | Admitting: Family

## 2013-11-04 ENCOUNTER — Ambulatory Visit: Payer: PRIVATE HEALTH INSURANCE | Admitting: Family

## 2013-11-05 ENCOUNTER — Ambulatory Visit: Payer: PRIVATE HEALTH INSURANCE | Admitting: Physician Assistant

## 2013-11-05 ENCOUNTER — Telehealth: Payer: Self-pay | Admitting: Family

## 2013-11-05 DIAGNOSIS — Z0289 Encounter for other administrative examinations: Secondary | ICD-10-CM

## 2013-11-05 NOTE — Telephone Encounter (Signed)
Patient has cancelled appointment several times. Patient called in stating that he does not need appointment for today, stating that he "found" some prednisone and will take that instead.

## 2013-11-26 ENCOUNTER — Telehealth: Payer: Self-pay | Admitting: Family

## 2013-11-26 NOTE — Telephone Encounter (Signed)
Patient stated that Lidocaine cream is for his foot pain (bilateral). Patient also requesting referral to have endoscopic precedure repeated. PCP has never given referral for pt to have this procedure.

## 2013-11-26 NOTE — Telephone Encounter (Signed)
Received request for lidocaine cream.  Did he request this and if so, where will he be applying for pain?

## 2013-11-27 ENCOUNTER — Other Ambulatory Visit: Payer: Self-pay | Admitting: Family Medicine

## 2013-11-27 NOTE — Telephone Encounter (Signed)
Will send rx for lidocaine cream.  In regards to endoscopy, we would need to refer him to GI and I would need to evaluate him in the office first please.

## 2013-11-27 NOTE — Telephone Encounter (Signed)
Refill request for Lyrica Last filled by MD on- 09/15/2013 #60 x0 Last Appt: 08/11/2013 Next Appt: none Please advise refill?

## 2013-11-28 NOTE — Telephone Encounter (Signed)
LMOM with contact name and number for return call RE: Medication response & Appointment needed per Rakesh Dutko instructions/SLS

## 2013-11-28 NOTE — Telephone Encounter (Signed)
OK to send #60 tabs zero refills.  

## 2013-12-03 NOTE — Telephone Encounter (Signed)
Rx called to pharmacy voicemail. 

## 2013-12-15 DIAGNOSIS — M5126 Other intervertebral disc displacement, lumbar region: Secondary | ICD-10-CM | POA: Diagnosis not present

## 2013-12-15 DIAGNOSIS — M1A00X0 Idiopathic chronic gout, unspecified site, without tophus (tophi): Secondary | ICD-10-CM | POA: Diagnosis not present

## 2013-12-15 DIAGNOSIS — M25579 Pain in unspecified ankle and joints of unspecified foot: Secondary | ICD-10-CM | POA: Diagnosis not present

## 2013-12-15 DIAGNOSIS — R262 Difficulty in walking, not elsewhere classified: Secondary | ICD-10-CM | POA: Diagnosis not present

## 2013-12-27 ENCOUNTER — Other Ambulatory Visit: Payer: Self-pay | Admitting: Family

## 2013-12-30 ENCOUNTER — Telehealth: Payer: Self-pay | Admitting: *Deleted

## 2013-12-30 NOTE — Telephone Encounter (Signed)
Needs OV please.

## 2013-12-30 NOTE — Telephone Encounter (Signed)
Pt left message that he thinks he is having a gout flare up in his wrist and wants a prednisone dose pack?  Please advise.

## 2013-12-30 NOTE — Telephone Encounter (Signed)
Notified pt. He voices frustration that we could not just prescribe medication without office visit. Advised pt that his wrist needs to be evaluated and test needs to be run to verify what we are treating. Pt scheduled appt with PA, Daphine DeutscherMartin for 12/31/13 at 4pm as PCP schedule is full.

## 2013-12-31 ENCOUNTER — Ambulatory Visit (INDEPENDENT_AMBULATORY_CARE_PROVIDER_SITE_OTHER): Payer: Medicare Other | Admitting: Physician Assistant

## 2013-12-31 ENCOUNTER — Encounter: Payer: Self-pay | Admitting: Physician Assistant

## 2013-12-31 VITALS — BP 98/64 | HR 90 | Temp 98.6°F | Resp 16 | Ht 72.25 in | Wt 303.0 lb

## 2013-12-31 DIAGNOSIS — M109 Gout, unspecified: Secondary | ICD-10-CM

## 2013-12-31 MED ORDER — METHYLPREDNISOLONE (PAK) 4 MG PO TABS: ORAL_TABLET | ORAL | Status: DC

## 2013-12-31 NOTE — Progress Notes (Signed)
Pre visit review using our clinic review tool, if applicable. No additional management support is needed unless otherwise documented below in the visit note/SLS  

## 2013-12-31 NOTE — Progress Notes (Signed)
Patient presents to clinic today c/o pain, redness and swelling of right wrist.  Patient with history of gout. Is currently on colchicine daily for prophylaxis.  Endorses eating a lot of red meat prior to onset of symptoms.  Denies trauma or injury.  Denies pain, swelling or stiffness of other joints.  Denies fever, chills, myalgias.   Past Medical History  Diagnosis Date  . Vein, varicose   . Hyperlipidemia   . Varicose veins   . Complication of anesthesia     difficulty waking up  . Hypertension     Takes prinzide daily  . Diabetes mellitus     not consistent on taking medications  . Peripheral vascular disease   . Chronic kidney disease     acute renal failure in February admission  . Anemia     hx of blood transfusion  . Wound check, dressing change     dressing changes 3x/week; open wound  . Depression   . History of headache   . History of hepatitis C     competed treatment  . Neuromuscular disorder     gout and neuropathy  . History of blood clots 1986 or 87    blood clot in groin?  . Gout     Current Outpatient Prescriptions on File Prior to Visit  Medication Sig Dispense Refill  . amitriptyline (ELAVIL) 100 MG tablet Take 1 tablet (100 mg total) by mouth at bedtime.  30 tablet  5  . aspirin EC 81 MG tablet Take 81 mg by mouth daily.      Marland Kitchen. COLCRYS 0.6 MG tablet TAKE 1 TABLET BY MOUTH EVERY DAY  30 tablet  2  . Ferrous Sulfate (IRON) 325 (65 FE) MG TABS Take 1 tablet by mouth daily.      Marland Kitchen. gabapentin (NEURONTIN) 800 MG tablet Take 1 tablet (800 mg total) by mouth 2 (two) times daily with a meal.  60 tablet  0  . hydrochlorothiazide (HYDRODIURIL) 25 MG tablet TAKE 1 TABLET BY MOUTH EVERY DAY  30 tablet  3  . LYRICA 300 MG capsule TAKE ONE CAPSULE BY MOUTH TWICE A DAY  60 capsule  0  . metFORMIN (GLUCOPHAGE) 500 MG tablet TAKE 1 TABLET BY MOUTH TWICE A DAY WITH A MEAL  60 tablet  2  . methadone (DOLOPHINE) 10 MG tablet Take 10 mg by mouth 3 (three) times daily.      .  metoprolol succinate (TOPROL-XL) 25 MG 24 hr tablet Take 1 tablet (25 mg total) by mouth daily.  30 tablet  5  . Oxycodone HCl 20 MG TABS Take 1 tablet (20 mg total) by mouth 4 (four) times daily as needed (for pain). Take 1/2 - 1 tablet (10-20mg ) by mouth 2-4 times daily as needed for pain  30 tablet  0  . pravastatin (PRAVACHOL) 20 MG tablet TAKE 1 TABLET BY MOUTH AT BEDTIME  90 tablet  1  . vitamin B-12 (CYANOCOBALAMIN) 1000 MCG tablet Take 1,000 mcg by mouth daily.       No current facility-administered medications on file prior to visit.    Allergies  Allergen Reactions  . Lisinopril     Hyperkalemia   . Percocet [Oxycodone-Acetaminophen] Nausea And Vomiting  . Pork-Derived Products     Family History  Problem Relation Age of Onset  . Cancer Mother     liver?  Marland Kitchen. Hyperlipidemia Father   . Hypertension Father   . Diabetes Father   . Alcohol abuse Father   .  Arthritis Father   . Kidney disease Father     History   Social History  . Marital Status: Single    Spouse Name: N/A    Number of Children: 5  . Years of Education: N/A   Social History Main Topics  . Smoking status: Current Every Day Smoker -- 1.00 packs/day for 35 years    Types: Cigars    Last Attempt to Quit: 11/12/2011  . Smokeless tobacco: Never Used  . Alcohol Use: No  . Drug Use: No  . Sexual Activity: None   Other Topics Concern  . None   Social History Narrative   Regular exercise:  Walks a little every day.   Lives with 29 yr old son.   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   Quit smoking few months ago.   [facility]es to [facility]- drug free since 1994.   Former Etoh abuse.   Review of Systems - See HPI.  All other ROS are negative.  BP 98/64  Pulse 90  Temp(Src) 98.6 F (37 C) (Oral)  Resp 16  Ht 6' 0.25" (1.835 m)  Wt 303 lb (137.44 kg)  BMI 40.82 kg/m2  SpO2 95%  Physical Exam  Vitals reviewed. Constitutional: He is oriented to person, place, and  time.  Cardiovascular: Normal rate, regular rhythm, normal heart sounds and intact distal pulses.   Pulmonary/Chest: Effort normal and breath sounds normal. No respiratory distress. He has no wheezes. He has no rales. He exhibits no tenderness.  Musculoskeletal:       Right wrist: He exhibits tenderness and swelling. He exhibits normal range of motion and no bony tenderness.  Neurological: He is alert and oriented to person, place, and time.  Skin: Skin is warm and dry. No erythema.  Psychiatric: Affect normal.    No results found for this or any previous visit (from the past 2160 hour(s)).  Assessment/Plan: Gout Rx medrol dose pack.  Apply Ice and Topical Aspercreme to the area.  Follow-up in 2 weeks with myself or PCP.  May need to discuss different medication for prophylaxis.  Avoid red meats or alcohol.

## 2013-12-31 NOTE — Patient Instructions (Addendum)
Please take Medrol Dose Pack as directed.  Avoid red meats and alcohol.  Follow-up with Melissa in 2 weeks.  Talk to her about restarting a different prophylactic medication.

## 2014-01-01 NOTE — Assessment & Plan Note (Signed)
Rx medrol dose pack.  Apply Ice and Topical Aspercreme to the area.  Follow-up in 2 weeks with myself or PCP.  May need to discuss different medication for prophylaxis.  Avoid red meats or alcohol.

## 2014-01-05 ENCOUNTER — Telehealth: Payer: Self-pay | Admitting: *Deleted

## 2014-01-05 MED ORDER — AMITRIPTYLINE HCL 100 MG PO TABS: 100.0000 mg | ORAL_TABLET | Freq: Every day | ORAL | Status: DC

## 2014-01-05 NOTE — Telephone Encounter (Signed)
Pt left message requesting refill of amitriptyline. Refills sent. Attempted to notify pt and call was disconnected.

## 2014-01-06 ENCOUNTER — Other Ambulatory Visit: Payer: Self-pay | Admitting: Family

## 2014-01-06 NOTE — Telephone Encounter (Signed)
Last Lyrica rx phoned in 12/03/13. Rx printed and forwarded to Provider for signature.  Medication name:  Name from pharmacy:  LYRICA 300 MG capsule  LYRICA 300 MG CAPSULE Sig: TAKE ONE CAPSULE BY MOUTH TWICE A DAY Dispense: 60 capsule Refills: 0 Start: 01/06/2014 Class: Normal Notes to pharmacy: Not to exceed 5 additional fills before 02/07/2014 Requested on: 08/11/2013 Originally ordered on: 10/22/2012 Last refill: 12/01/2013

## 2014-01-07 NOTE — Telephone Encounter (Signed)
Rx faxed to pharmacy  

## 2014-01-13 ENCOUNTER — Telehealth: Payer: Self-pay | Admitting: *Deleted

## 2014-01-13 NOTE — Telephone Encounter (Signed)
Pt left message that today is the first day he has been able to be up and move around since his last visit on 12/31/13. Now has knee pain. Was having pain in wrist on 12/31/13 and he states that he has had pain from his collarbone down his back and into his knee. Advised pt he should be seen in the office for further evaluation as I am not sure this is all gout related. Pt scheduled appt for 01/14/14 at 8:30am.

## 2014-01-14 ENCOUNTER — Ambulatory Visit: Payer: PRIVATE HEALTH INSURANCE | Admitting: Family

## 2014-01-14 ENCOUNTER — Telehealth: Payer: Self-pay | Admitting: *Deleted

## 2014-01-14 DIAGNOSIS — Z0289 Encounter for other administrative examinations: Secondary | ICD-10-CM

## 2014-01-14 NOTE — Telephone Encounter (Signed)
He should proceed to the ER if he is unable to walk. Can call EMS if necessary.

## 2014-01-14 NOTE — Telephone Encounter (Signed)
Received call from pt stating he is unable to walk today due to the pain in his back and legs. Denies numbness in legs, denies loss of bowel or bladder function. States he is currently constipated. Pt is unable to come in for appt today. Please see 01/13/14 phone note and advise.

## 2014-01-14 NOTE — Telephone Encounter (Signed)
Notified pt and he voices understanding. 

## 2014-01-19 DIAGNOSIS — M1A00X0 Idiopathic chronic gout, unspecified site, without tophus (tophi): Secondary | ICD-10-CM | POA: Diagnosis not present

## 2014-01-19 DIAGNOSIS — M25579 Pain in unspecified ankle and joints of unspecified foot: Secondary | ICD-10-CM | POA: Diagnosis not present

## 2014-01-19 DIAGNOSIS — M5126 Other intervertebral disc displacement, lumbar region: Secondary | ICD-10-CM | POA: Diagnosis not present

## 2014-01-19 DIAGNOSIS — R262 Difficulty in walking, not elsewhere classified: Secondary | ICD-10-CM | POA: Diagnosis not present

## 2014-01-21 ENCOUNTER — Telehealth: Payer: Self-pay | Admitting: Family

## 2014-01-21 NOTE — Telephone Encounter (Signed)
Open in error

## 2014-02-03 ENCOUNTER — Ambulatory Visit (INDEPENDENT_AMBULATORY_CARE_PROVIDER_SITE_OTHER): Payer: Medicare Other | Admitting: Family

## 2014-02-03 ENCOUNTER — Encounter: Payer: Self-pay | Admitting: Family

## 2014-02-03 VITALS — BP 130/80 | HR 78 | Temp 98.2°F | Resp 16 | Ht 72.25 in | Wt 294.4 lb

## 2014-02-03 DIAGNOSIS — M545 Low back pain, unspecified: Secondary | ICD-10-CM

## 2014-02-03 DIAGNOSIS — G8929 Other chronic pain: Secondary | ICD-10-CM | POA: Diagnosis not present

## 2014-02-03 DIAGNOSIS — M109 Gout, unspecified: Secondary | ICD-10-CM

## 2014-02-03 DIAGNOSIS — Z23 Encounter for immunization: Secondary | ICD-10-CM | POA: Diagnosis not present

## 2014-02-03 DIAGNOSIS — E119 Type 2 diabetes mellitus without complications: Secondary | ICD-10-CM | POA: Diagnosis not present

## 2014-02-03 MED ORDER — METHYLPREDNISOLONE (PAK) 4 MG PO TABS: ORAL_TABLET | ORAL | Status: DC

## 2014-02-03 NOTE — Patient Instructions (Addendum)
Start medrol dose pak. Check sugars twice daily while on medrol dose pak. Call if sugar >300.   Complete lab work prior to leaving. Follow up in 3 months, sooner if problems/concerns.

## 2014-02-03 NOTE — Assessment & Plan Note (Signed)
Deteriorated. Trial of medrol dose pak with close monitoring of his sugars.  I do not think that his back pain is due to gout.

## 2014-02-03 NOTE — Progress Notes (Signed)
Subjective:    Patient ID: [name] [name], male    DOB: September 03, 1950, 63 y.o.   MRN: [id number]  HPI  Mr. Yniguez is a 63 yr old male who presents today with chief complaint of back pain.  Reports that his pain in his left lower back. Pain is non-radiating. He follows with Dr. Kerney Elbe in [facility] for neurology/pain management. He is using oxycodone and methadone for the pain. He has hx of degenerative disc disease. He reports his adult weight has generally around 250.   He has steadily gained weight over the last 2 years.   Wt Readings from Last 3 Encounters:  02/03/14 294 lb 6.4 oz (133.539 kg)  12/31/13 303 lb (137.44 kg)  08/11/13 286 lb 0.6 oz (129.747 kg)    Review of Systems See HPI  Past Medical History  Diagnosis Date  . Vein, varicose   . Hyperlipidemia   . Varicose veins   . Complication of anesthesia     difficulty waking up  . Hypertension     Takes prinzide daily  . Diabetes mellitus     not consistent on taking medications  . Peripheral vascular disease   . Chronic kidney disease     acute renal failure in February admission  . Anemia     hx of blood transfusion  . Wound check, dressing change     dressing changes 3x/week; open wound  . Depression   . History of headache   . History of hepatitis C     competed treatment  . Neuromuscular disorder     gout and neuropathy  . History of blood clots 1986 or 87    blood clot in groin?  . Gout     History   Social History  . Marital Status: Single    Spouse Name: N/A    Number of Children: 5  . Years of Education: N/A   Occupational History  . Not on file.   Social History Main Topics  . Smoking status: Current Every Day Smoker -- 1.00 packs/day for 35 years    Types: Cigars    Last Attempt to Quit: 11/12/2011  . Smokeless tobacco: Never Used  . Alcohol Use: No  . Drug Use: No  . Sexual Activity: Not on file   Other Topics Concern  . Not on file   Social History Narrative   Regular  exercise:  Walks a little every day.   Lives with 49 yr old son.   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   Quit smoking few months ago.   Goes to [facility]- drug free since 1994.   Former Etoh abuse.    Past Surgical History  Procedure Laterality Date  . Varicose vein surgery    . Hemorrhoid surgery    . Vascular surgery  1980s    left leg  . Femoral-popliteal bypass graft  11/24/2011    Procedure: BYPASS GRAFT FEMORAL-POPLITEAL ARTERY;  Surgeon: Larina Earthly, MD;  Location: [facility] OR;  Service: Vascular;  Laterality: Right;    Family History  Problem Relation Age of Onset  . Cancer Mother     liver?  Marland Kitchen Hyperlipidemia Father   . Hypertension Father   . Diabetes Father   . Alcohol abuse Father   . Arthritis Father   . Kidney disease Father     Allergies  Allergen Reactions  . Lisinopril     Hyperkalemia   . Percocet [Oxycodone-Acetaminophen] Nausea  And Vomiting  . Pork-Derived Products     Current Outpatient Prescriptions on File Prior to Visit  Medication Sig Dispense Refill  . amitriptyline (ELAVIL) 100 MG tablet Take 1 tablet (100 mg total) by mouth at bedtime.  30 tablet  5  . aspirin EC 81 MG tablet Take 81 mg by mouth daily.      Marland Kitchen COLCRYS 0.6 MG tablet TAKE 1 TABLET BY MOUTH EVERY DAY  30 tablet  2  . Ferrous Sulfate (IRON) 325 (65 FE) MG TABS Take 1 tablet by mouth daily.      Marland Kitchen gabapentin (NEURONTIN) 800 MG tablet Take 1 tablet (800 mg total) by mouth 2 (two) times daily with a meal.  60 tablet  0  . hydrochlorothiazide (HYDRODIURIL) 25 MG tablet TAKE 1 TABLET BY MOUTH EVERY DAY  30 tablet  3  . LYRICA 300 MG capsule TAKE ONE CAPSULE BY MOUTH TWICE A DAY  60 capsule  0  . metFORMIN (GLUCOPHAGE) 500 MG tablet TAKE 1 TABLET BY MOUTH TWICE A DAY WITH A MEAL  60 tablet  2  . methadone (DOLOPHINE) 10 MG tablet Take 10 mg by mouth 3 (three) times daily.      . metoprolol succinate (TOPROL-XL) 25 MG 24 hr tablet Take 1 tablet (25 mg  total) by mouth daily.  30 tablet  5  . Oxycodone HCl 20 MG TABS Take 1 tablet (20 mg total) by mouth 4 (four) times daily as needed (for pain). Take 1/2 - 1 tablet (10-20mg ) by mouth 2-4 times daily as needed for pain  30 tablet  0  . pravastatin (PRAVACHOL) 20 MG tablet TAKE 1 TABLET BY MOUTH AT BEDTIME  90 tablet  1  . vitamin B-12 (CYANOCOBALAMIN) 1000 MCG tablet Take 1,000 mcg by mouth daily.       No current facility-administered medications on file prior to visit.    BP 130/80  Pulse 78  Temp(Src) 98.2 F (36.8 C) (Oral)  Resp 16  Ht 6' 0.25" (1.835 m)  Wt 294 lb 6.4 oz (133.539 kg)  BMI 39.66 kg/m2  SpO2 96%       Objective:   Physical Exam  Constitutional: He is oriented to person, place, and time. He appears well-developed and well-nourished. No distress.  Cardiovascular: Normal rate and regular rhythm.   No murmur heard. Pulmonary/Chest: Effort normal and breath sounds normal. No respiratory distress. He has no wheezes. He has no rales. He exhibits no tenderness.  Neurological: He is alert and oriented to person, place, and time.  Bilateral LE strength was 4-5/5.   Psychiatric: He has a normal mood and affect. His behavior is normal. Judgment and thought content normal.          Assessment & Plan:

## 2014-02-03 NOTE — Progress Notes (Signed)
Pre visit review using our clinic review tool, if applicable. No additional management support is needed unless otherwise documented below in the visit note. 

## 2014-02-04 ENCOUNTER — Telehealth: Payer: Self-pay | Admitting: *Deleted

## 2014-02-04 LAB — BASIC METABOLIC PANEL
BUN: 17 mg/dL (ref 6–23)
CHLORIDE: 104 meq/L (ref 96–112)
CO2: 25 mEq/L (ref 19–32)
Calcium: 9.3 mg/dL (ref 8.4–10.5)
Creatinine, Ser: 1.3 mg/dL (ref 0.4–1.5)
GFR: 73.79 mL/min (ref >60.00)
Glucose, Bld: 102 mg/dL — ABNORMAL HIGH (ref 70–99)
POTASSIUM: 4.6 meq/L (ref 3.5–5.1)
SODIUM: 138 meq/L (ref 135–145)

## 2014-02-04 LAB — HEMOGLOBIN A1C

## 2014-02-04 NOTE — Telephone Encounter (Signed)
Message copied by Kathi Simpers on Wed Feb 04, 2014  4:52 PM ------      Message from: O'SULLIVAN, MELISSA      Created: Wed Feb 04, 2014  3:06 PM       Please ask lab to re-run the A1C, I believe it is a lab error see result "0.0" ------

## 2014-02-04 NOTE — Telephone Encounter (Signed)
Spoke with phlebotomist, she will contact lab to ask them to rerun test.

## 2014-02-05 MED ORDER — SITAGLIPTIN PHOSPHATE 100 MG PO TABS: 100.0000 mg | ORAL_TABLET | Freq: Every day | ORAL | Status: DC

## 2014-02-05 NOTE — Telephone Encounter (Signed)
Follow up A1C is 8.  I would like the pt to add januvia once daily to help improve his sugar along with diet/exercise/weight loss.

## 2014-02-06 ENCOUNTER — Other Ambulatory Visit: Payer: Self-pay | Admitting: Family

## 2014-02-06 ENCOUNTER — Telehealth: Payer: Self-pay | Admitting: *Deleted

## 2014-02-06 NOTE — Telephone Encounter (Signed)
Spoke with pt. He states that he became sick on day 2 of his dose pack and went back to his Prednisone. States he doesn't think he will be able to take any more. Advised pt that the dosepack is prednisone. Pt states he will try it again and let us know the outcome. Pt wanted to know if you had spoken with his specialist re: this new type of gout?

## 2014-02-06 NOTE — Telephone Encounter (Signed)
Notified pt and he voices understanding. 

## 2014-02-13 ENCOUNTER — Telehealth: Payer: Self-pay | Admitting: *Deleted

## 2014-02-13 NOTE — Telephone Encounter (Signed)
Dose pack is a steroid but a different steroid from prednisone.  What was his symptom on medrol? OK to call send in pred taper as pended below instead of medrol dose pak if he better tolerates:

## 2014-02-13 NOTE — Telephone Encounter (Signed)
Received form from [facility] to be completed for home health services. Form forwarded to PRovider for completion.

## 2014-02-14 NOTE — Telephone Encounter (Signed)
Noted  

## 2014-02-16 ENCOUNTER — Other Ambulatory Visit: Payer: Self-pay | Admitting: Family

## 2014-02-16 DIAGNOSIS — R262 Difficulty in walking, not elsewhere classified: Secondary | ICD-10-CM | POA: Diagnosis not present

## 2014-02-16 DIAGNOSIS — M25579 Pain in unspecified ankle and joints of unspecified foot: Secondary | ICD-10-CM | POA: Diagnosis not present

## 2014-02-16 DIAGNOSIS — M5126 Other intervertebral disc displacement, lumbar region: Secondary | ICD-10-CM | POA: Diagnosis not present

## 2014-02-16 DIAGNOSIS — M1A00X0 Idiopathic chronic gout, unspecified site, without tophus (tophi): Secondary | ICD-10-CM | POA: Diagnosis not present

## 2014-02-16 NOTE — Telephone Encounter (Signed)
Left message for pt to return my call.

## 2014-02-17 NOTE — Telephone Encounter (Signed)
Refill called to pharmacy voicemail. 

## 2014-02-17 NOTE — Telephone Encounter (Signed)
eScribe request from [facility] for refill on Lyrica 300 Last filled - 08.04.15, #[id number] Last AEX - 09.01.15 Next AEX - 3-Mths. Please Advise on refills/SLS

## 2014-02-17 NOTE — Telephone Encounter (Signed)
Ok to send #[phone] no refills.  

## 2014-02-20 ENCOUNTER — Telehealth: Payer: Self-pay | Admitting: Family

## 2014-02-20 MED ORDER — COLCHICINE 0.6 MG PO TABS: ORAL_TABLET | ORAL | Status: DC

## 2014-02-20 NOTE — Telephone Encounter (Signed)
Notified pt and he voices understanding. 

## 2014-02-20 NOTE — Telephone Encounter (Signed)
I sent refill on colchicine- for gout flare take 2 tabs followed by 1 tab 1 hour later.  Then resume one tab by mouth daily the following day.

## 2014-02-20 NOTE — Telephone Encounter (Signed)
Request something for gout be called in, now in [facility] toe [facility]

## 2014-02-23 ENCOUNTER — Telehealth: Payer: Self-pay | Admitting: *Deleted

## 2014-02-23 ENCOUNTER — Ambulatory Visit: Payer: Medicaid Other | Admitting: Medical

## 2014-02-23 NOTE — Telephone Encounter (Signed)
Pt did not show for appointment 02/23/2014 at 9:30am for gout

## 2014-03-02 ENCOUNTER — Ambulatory Visit: Payer: Medicaid Other | Admitting: Medical

## 2014-03-17 ENCOUNTER — Other Ambulatory Visit: Payer: Self-pay | Admitting: Family

## 2014-03-18 NOTE — Telephone Encounter (Signed)
Rx printed and forwarded to Provider for signature.  Medication name:  Name from pharmacy:  LYRICA 300 MG capsule  LYRICA 300 MG CAPSULE Sig: TAKE ONE CAPSULE BY MOUTH TWICE A DAY Dispense: 60 capsule Refills: 0 Start: 03/17/2014 Class: Normal Notes to pharmacy: Not to exceed 6 additional fills before 08/16/2014. Requested on: 02/17/2014 Originally ordered on: 10/22/2012 Last refill: 02/17/2014

## 2014-03-20 DIAGNOSIS — M25579 Pain in unspecified ankle and joints of unspecified foot: Secondary | ICD-10-CM | POA: Diagnosis not present

## 2014-03-20 DIAGNOSIS — R262 Difficulty in walking, not elsewhere classified: Secondary | ICD-10-CM | POA: Diagnosis not present

## 2014-03-20 DIAGNOSIS — M1A9XX0 Chronic gout, unspecified, without tophus (tophi): Secondary | ICD-10-CM | POA: Diagnosis not present

## 2014-03-20 NOTE — Telephone Encounter (Signed)
Rx was faxed to pharmacy on 03/18/14 at 4:35am.

## 2014-03-24 ENCOUNTER — Other Ambulatory Visit: Payer: Self-pay | Admitting: Family

## 2014-04-10 ENCOUNTER — Ambulatory Visit (INDEPENDENT_AMBULATORY_CARE_PROVIDER_SITE_OTHER): Payer: Medicare Other | Admitting: Medical

## 2014-04-10 VITALS — BP 126/82 | HR 97 | Temp 98.4°F | Ht <= 58 in | Wt 302.0 lb

## 2014-04-10 DIAGNOSIS — L97929 Non-pressure chronic ulcer of unspecified part of left lower leg with unspecified severity: Secondary | ICD-10-CM

## 2014-04-10 DIAGNOSIS — L97909 Non-pressure chronic ulcer of unspecified part of unspecified lower leg with unspecified severity: Secondary | ICD-10-CM | POA: Insufficient documentation

## 2014-04-10 DIAGNOSIS — L97902 Non-pressure chronic ulcer of unspecified part of unspecified lower leg with fat layer exposed: Secondary | ICD-10-CM

## 2014-04-10 MED ORDER — MUPIROCIN CALCIUM 2 % EX CREA: 1.0000 "application " | TOPICAL_CREAM | Freq: Two times a day (BID) | CUTANEOUS | Status: DC

## 2014-04-10 MED ORDER — SULFAMETHOXAZOLE-TRIMETHOPRIM 800-160 MG PO TABS: 1.0000 | ORAL_TABLET | Freq: Two times a day (BID) | ORAL | Status: DC

## 2014-04-10 NOTE — Assessment & Plan Note (Signed)
For your pretibial shallow ulcer, I am going to rx mupirocin and bacrim ds. I am also going to go ahead and refer you to the wound center. The culture of the wound is pending and we will call you when results are in.  If area were to worsen over the weekend then UC or ED evaluation  Follow up in 7 days or as needed.

## 2014-04-10 NOTE — Progress Notes (Signed)
Subjective:    Patient ID: [name] [name], male    DOB: Nov 27, 1950, 63 y.o.   MRN: [id number]  HPI  Pt states he has recent ulcer that is beginning to form. Pt states this has happened before. This area is present on left shin area. He states he had this various times in the past. Different tx by different providers. Fastest and quickest wound resolution solution was with wound clinic when unaboot was placed  Pt is diabetic but told borderline.  Current ulcer present x 1 day.  Past Medical History  Diagnosis Date  . Vein, varicose   . Hyperlipidemia   . Varicose veins   . Complication of anesthesia     difficulty waking up  . Hypertension     Takes prinzide daily  . Diabetes mellitus     not consistent on taking medications  . Peripheral vascular disease   . Chronic kidney disease     acute renal failure in February admission  . Anemia     hx of blood transfusion  . Wound check, dressing change     dressing changes 3x/week; open wound  . Depression   . History of headache   . History of hepatitis C     competed treatment  . Neuromuscular disorder     gout and neuropathy  . History of blood clots 1986 or 87    blood clot in groin?  . Gout     History   Social History  . Marital Status: Single    Spouse Name: N/A    Number of Children: 5  . Years of Education: N/A   Occupational History  . Not on file.   Social History Main Topics  . Smoking status: Current Every Day Smoker -- 1.00 packs/day for 35 years    Types: Cigars    Last Attempt to Quit: 11/12/2011  . Smokeless tobacco: Never Used  . Alcohol Use: No  . Drug Use: No  . Sexual Activity: Not on file   Other Topics Concern  . Not on file   Social History Narrative   Regular exercise:  Walks a little every day.   Lives with 62 yr old son.   Associated degree social services   Working on [employer].   Quit smoking few months ago.   [facility]es to [facility]- drug free since 1994.   Former  Etoh abuse.    Past Surgical History  Procedure Laterality Date  . Varicose vein surgery    . Hemorrhoid surgery    . Vascular surgery  1980s    left leg  . Femoral-popliteal bypass graft  11/24/2011    Procedure: BYPASS GRAFT FEMORAL-POPLITEAL ARTERY;  Surgeon: Larina Earthlyodd F Early, MD;  [facility];  Service: Vascular;  Laterality: Right;    Family History  Problem Relation Age of Onset  . Cancer Mother     liver?  Marland Kitchen. Hyperlipidemia Father   . Hypertension Father   . Diabetes Father   . Alcohol abuse Father   . Arthritis Father   . Kidney disease Father     Allergies  Allergen Reactions  . Lisinopril     Hyperkalemia   . Percocet [Oxycodone-Acetaminophen] Nausea And Vomiting  . Pork-Derived Products     Current Outpatient Prescriptions on File Prior to Visit  Medication Sig Dispense Refill  . amitriptyline (ELAVIL) 100 MG tablet Take 1 tablet (100 mg total) by mouth at bedtime. 30 tablet 5  . aspirin EC 81  MG tablet Take 81 mg by mouth daily.    . colchicine (COLCRYS) 0.6 MG tablet If gout flare take 2 tabs followed by 1 tab 1 hour later.  Then resume one tab by mouth daily. 40 tablet 1  . gabapentin (NEURONTIN) 800 MG tablet Take 1 tablet (800 mg total) by mouth 2 (two) times daily with a meal. 60 tablet 0  . hydrochlorothiazide (HYDRODIURIL) 25 MG tablet TAKE 1 TABLET BY MOUTH EVERY DAY 30 tablet 3  . LYRICA 300 MG capsule TAKE ONE CAPSULE BY MOUTH TWICE A DAY 60 capsule 0  . metFORMIN (GLUCOPHAGE) 500 MG tablet TAKE 1 TABLET BY MOUTH TWICE A DAY WITH A MEAL 60 tablet 3  . methadone (DOLOPHINE) 10 MG tablet Take 10 mg by mouth 3 (three) times daily.    . metoprolol succinate (TOPROL-XL) 25 MG 24 hr tablet TAKE 1 TABLET BY MOUTH EVERY DAY 30 tablet 5  . Oxycodone HCl 20 MG TABS Take 1 tablet (20 mg total) by mouth 4 (four) times daily as needed (for pain). Take 1/2 - 1 tablet (10-20mg ) by mouth 2-4 times daily as needed for pain 30 tablet 0  . pravastatin (PRAVACHOL) 20 MG  tablet TAKE 1 TABLET BY MOUTH AT BEDTIME 90 tablet 1  . sitaGLIPtin (JANUVIA) 100 MG tablet Take 1 tablet (100 mg total) by mouth daily. 30 tablet 2  . Ferrous Sulfate (IRON) 325 (65 FE) MG TABS Take 1 tablet by mouth daily.    . vitamin B-12 (CYANOCOBALAMIN) 1000 MCG tablet Take 1,000 mcg by mouth daily.     No current facility-administered medications on file prior to visit.    BP 126/82 mmHg  Pulse 97  Temp(Src) 98.4 F (36.9 C) (Oral)  Ht 6.03" (0.153 m)  Wt 302 lb (136.986 kg)  BMI 5851.85 kg/m2  SpO2 96%    Review of Systems  Constitutional: Negative for fever, chills and fatigue.  Respiratory: Negative for cough, chest tightness, shortness of breath and wheezing.   Cardiovascular: Negative for chest pain and palpitations.  Genitourinary: Negative.   Skin:       Lt pretibial ulcer. As described on HPI.  Neurological: Negative.   Hematological: Negative for adenopathy. Does not bruise/bleed easily.  Psychiatric/Behavioral: Negative.        Objective:   Physical Exam   General- No acute distress. Lungs- CTA Heart-RRR Lt pretibial/lower extremity- 12mm x  7mm. Moderate deep 3-4 mm. Hyperpigmented skin in pretibial region. No redness or warmth. Dry yellow discharge at base of ulcer.        Assessment & Plan:

## 2014-04-10 NOTE — Progress Notes (Signed)
Pre visit review using our clinic review tool, if applicable. No additional management support is needed unless otherwise documented below in the visit note. 

## 2014-04-10 NOTE — Patient Instructions (Signed)
For your pretibial shallow ulcer, I am going to rx mupirocin and bacrim ds. I am also going to go ahead and refer you to the wound center. The culture of the wound is pending and we will call you when results are in.  If area were to worsen over the weekend then UC or ED evaluation  Follow up in 7 days or as needed. 

## 2014-04-13 LAB — WOUND CULTURE
GRAM STAIN: NONE SEEN
GRAM STAIN: NONE SEEN

## 2014-04-15 DIAGNOSIS — M25579 Pain in unspecified ankle and joints of unspecified foot: Secondary | ICD-10-CM | POA: Diagnosis not present

## 2014-04-15 DIAGNOSIS — M545 Low back pain: Secondary | ICD-10-CM | POA: Diagnosis not present

## 2014-04-15 DIAGNOSIS — E1142 Type 2 diabetes mellitus with diabetic polyneuropathy: Secondary | ICD-10-CM | POA: Diagnosis not present

## 2014-04-15 DIAGNOSIS — M1A9XX0 Chronic gout, unspecified, without tophus (tophi): Secondary | ICD-10-CM | POA: Diagnosis not present

## 2014-04-15 DIAGNOSIS — R262 Difficulty in walking, not elsewhere classified: Secondary | ICD-10-CM | POA: Diagnosis not present

## 2014-04-23 ENCOUNTER — Telehealth: Payer: Self-pay

## 2014-04-23 NOTE — Telephone Encounter (Signed)
Left a message for call back.  

## 2014-04-23 NOTE — Telephone Encounter (Signed)
Spoke with patient who states that he feels his wound is getting worse. Advised that I would check on his referral. (Scheduled for Mon 11/23 at 815). Patient notified.  Spoke Whole FoodsEdward Saguier, PA-C who had previously treated patient wound. He advises that the patient be seen before the appt. Scheduled to see Ramon DredgeEdward at 11am, 11/20.

## 2014-04-23 NOTE — Telephone Encounter (Signed)
[facility] Fax: [phone] [facility]  Date/ Time:04/22/2014 8:45 PM Taken By: Leona CarrySandra Messer, CSR Caller: Charmaine DownsLerry  Facility: home Patient: [name] [name], Fabrice DOB: June 20, 1950 Phone: [phone] Reason for Call: Ulcer on leg getting worse - antibiotic not working. Please give the patient a call.

## 2014-04-24 ENCOUNTER — Ambulatory Visit (INDEPENDENT_AMBULATORY_CARE_PROVIDER_SITE_OTHER): Payer: Medicare Other | Admitting: Medical

## 2014-04-24 ENCOUNTER — Encounter: Payer: Self-pay | Admitting: Medical

## 2014-04-24 VITALS — BP 138/78 | HR 79 | Temp 97.9°F | Ht 74.5 in | Wt 303.8 lb

## 2014-04-24 DIAGNOSIS — E785 Hyperlipidemia, unspecified: Secondary | ICD-10-CM

## 2014-04-24 DIAGNOSIS — S81802D Unspecified open wound, left lower leg, subsequent encounter: Secondary | ICD-10-CM | POA: Diagnosis not present

## 2014-04-24 DIAGNOSIS — L97929 Non-pressure chronic ulcer of unspecified part of left lower leg with unspecified severity: Secondary | ICD-10-CM | POA: Diagnosis not present

## 2014-04-24 DIAGNOSIS — T148XXA Other injury of unspecified body region, initial encounter: Secondary | ICD-10-CM

## 2014-04-24 DIAGNOSIS — D509 Iron deficiency anemia, unspecified: Secondary | ICD-10-CM | POA: Diagnosis not present

## 2014-04-24 DIAGNOSIS — T148 Other injury of unspecified body region: Secondary | ICD-10-CM | POA: Diagnosis not present

## 2014-04-24 LAB — COMPREHENSIVE METABOLIC PANEL
ALK PHOS: 96 U/L (ref 39–117)
ALT: 22 U/L (ref 0–53)
AST: 20 U/L (ref 0–37)
Albumin: 3.7 g/dL (ref 3.5–5.2)
BILIRUBIN TOTAL: 0.4 mg/dL (ref 0.2–1.2)
BUN: 11 mg/dL (ref 6–23)
CO2: 27 mEq/L (ref 19–32)
CREATININE: 1.2 mg/dL (ref 0.4–1.5)
Calcium: 9.5 mg/dL (ref 8.4–10.5)
Chloride: 103 mEq/L (ref 96–112)
GFR: 75.8 mL/min (ref >60.00)
Glucose, Bld: 117 mg/dL — ABNORMAL HIGH (ref 70–99)
POTASSIUM: 4.6 meq/L (ref 3.5–5.1)
Sodium: 139 mEq/L (ref 135–145)
Total Protein: 7.5 g/dL (ref 6.0–8.3)

## 2014-04-24 LAB — LDL CHOLESTEROL, DIRECT: Direct LDL: 151.5 mg/dL

## 2014-04-24 LAB — CBC WITH DIFFERENTIAL/PLATELET
Basophils Absolute: 0 10*3/uL (ref 0.0–0.1)
Basophils Relative: 0.6 % (ref 0.0–3.0)
EOS PCT: 3.1 % (ref 0.0–5.0)
Eosinophils Absolute: 0.2 10*3/uL (ref 0.0–0.7)
HEMATOCRIT: 42.1 % (ref 39.0–52.0)
Hemoglobin: 13.9 g/dL (ref 13.0–17.0)
LYMPHS ABS: 3.7 10*3/uL (ref 0.7–4.0)
LYMPHS PCT: 49.7 % — AB (ref 12.0–46.0)
MCHC: 33 g/dL (ref 30.0–36.0)
MCV: 99.3 fl (ref 78.0–100.0)
Monocytes Absolute: 0.7 10*3/uL (ref 0.1–1.0)
Monocytes Relative: 9.5 % (ref 3.0–12.0)
Neutro Abs: 2.8 10*3/uL (ref 1.4–7.7)
Neutrophils Relative %: 37.1 % — ABNORMAL LOW (ref 43.0–77.0)
PLATELETS: 180 10*3/uL (ref 150.0–400.0)
RBC: 4.24 Mil/uL (ref 4.22–5.81)
RDW: 15.4 % (ref 11.5–15.5)
WBC: 7.4 10*3/uL (ref 4.0–10.5)

## 2014-04-24 LAB — LIPID PANEL
CHOL/HDL RATIO: 9
CHOLESTEROL: 244 mg/dL — AB (ref 0–200)
HDL: 27.6 mg/dL — AB (ref >39.00)
NonHDL: 216.4
TRIGLYCERIDES: 399 mg/dL — AB (ref 0.0–149.0)
VLDL: 79.8 mg/dL — AB (ref 0.0–40.0)

## 2014-04-24 MED ORDER — IRON 325 (65 FE) MG PO TABS: 1.0000 | ORAL_TABLET | Freq: Every day | ORAL | Status: AC

## 2014-04-24 MED ORDER — SULFAMETHOXAZOLE-TRIMETHOPRIM 800-160 MG PO TABS: 1.0000 | ORAL_TABLET | Freq: Two times a day (BID) | ORAL | Status: DC

## 2014-04-24 MED ORDER — PRAVASTATIN SODIUM 20 MG PO TABS: ORAL_TABLET | ORAL | Status: DC

## 2014-04-24 MED ORDER — CEFTRIAXONE SODIUM 1 G IJ SOLR
1.0000 g | Freq: Once | INTRAMUSCULAR | Status: AC
  Administered 2014-04-24: 1 g via INTRAMUSCULAR

## 2014-04-24 NOTE — Progress Notes (Signed)
Subjective:    Patient ID: [name] [name], male    DOB: May 13, 1951, 63 y.o.   MRN: [id number]  HPI   I did put referral in and that has been delayed. The area did worsen some since I last saw him. He does have appointment now on Monday with wound care. Some discharge from wound which has  widened some  Pt did not feel good yesterday. Mild chills. Pt culture came back positive for bacteria.   Pt needs to pravastatin refill and need ferrous sulfate. Hx of anemia.  Pt is fasting.      Review of Systems  Constitutional: Positive for chills. Negative for fever and fatigue.       Mild chills yesterday.  HENT: Negative for congestion, ear discharge, ear pain, nosebleeds, postnasal drip, rhinorrhea, sinus pressure, sneezing, sore throat and trouble swallowing.   Respiratory: Negative for cough, chest tightness, shortness of breath and wheezing.   Cardiovascular: Negative for chest pain and palpitations.  Gastrointestinal: Negative for nausea, abdominal pain, diarrhea and rectal pain.  Musculoskeletal: Negative for back pain.  Neurological: Negative for dizziness, tremors, seizures, syncope, facial asymmetry, weakness, light-headedness, numbness and headaches.  Hematological: Negative for adenopathy. Does not bruise/bleed easily.  Psychiatric/Behavioral: Negative for suicidal ideas, behavioral problems, self-injury and dysphoric mood. The patient is not nervous/anxious.        Objective:   Physical Exam     Subjective:    Patient ID: [name] [name], male    DOB: May 13, 1951, 63 y.o.   MRN: [id number]  HPI  Pt states he has recent ulcer that is beginning to form. Pt states this has happened before. This area is present on left shin area. He states he had this various times in the past. Different tx by different providers. Fastest and quickest wound resolution solution was with wound clinic when unaboot was placed  Pt is diabetic but told borderline.  Current ulcer present x 1  day.  Past Medical History  Diagnosis Date  . Vein, varicose   . Hyperlipidemia   . Varicose veins   . Complication of anesthesia     difficulty waking up  . Hypertension     Takes prinzide daily  . Diabetes mellitus     not consistent on taking medications  . Peripheral vascular disease   . Chronic kidney disease     acute renal failure in February admission  . Anemia     hx of blood transfusion  . Wound check, dressing change     dressing changes 3x/week; open wound  . Depression   . History of headache   . History of hepatitis C     competed treatment  . Neuromuscular disorder     gout and neuropathy  . History of blood clots 1986 or 87    blood clot in groin?  . Gout     History   Social History  . Marital Status: Single    Spouse Name: N/A    Number of Children: 5  . Years of Education: N/A   Occupational History  . Not on file.   Social History Main Topics  . Smoking status: Current Every Day Smoker -- 1.00 packs/day for 35 years    Types: Cigars    Last Attempt to Quit: 11/12/2011  . Smokeless tobacco: Never Used  . Alcohol Use: No  . Drug Use: No  . Sexual Activity: Not on file   Other Topics Concern  . Not on file  Social History Narrative   Regular exercise:  Walks a little every day.   Lives with 83 yr old son.   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   Quit smoking few months ago.   Goes to [facility]- drug free since 1994.   Former Etoh abuse.    Past Surgical History  Procedure Laterality Date  . Varicose vein surgery    . Hemorrhoid surgery    . Vascular surgery  1980s    left leg  . Femoral-popliteal bypass graft  11/24/2011    Procedure: BYPASS GRAFT FEMORAL-POPLITEAL ARTERY;  Surgeon: Larina Earthly, MD;  [facility];  Service: Vascular;  Laterality: Right;    Family History  Problem Relation Age of Onset  . Cancer Mother     liver?  Marland Kitchen Hyperlipidemia Father   . Hypertension Father   .  Diabetes Father   . Alcohol abuse Father   . Arthritis Father   . Kidney disease Father     Allergies  Allergen Reactions  . Lisinopril     Hyperkalemia   . Percocet [Oxycodone-Acetaminophen] Nausea And Vomiting  . Pork-Derived Products     Current Outpatient Prescriptions on File Prior to Visit  Medication Sig Dispense Refill  . amitriptyline (ELAVIL) 100 MG tablet Take 1 tablet (100 mg total) by mouth at bedtime. 30 tablet 5  . aspirin EC 81 MG tablet Take 81 mg by mouth daily.    . colchicine (COLCRYS) 0.6 MG tablet If gout flare take 2 tabs followed by 1 tab 1 hour later.  Then resume one tab by mouth daily. 40 tablet 1  . gabapentin (NEURONTIN) 800 MG tablet Take 1 tablet (800 mg total) by mouth 2 (two) times daily with a meal. 60 tablet 0  . hydrochlorothiazide (HYDRODIURIL) 25 MG tablet TAKE 1 TABLET BY MOUTH EVERY DAY 30 tablet 3  . LYRICA 300 MG capsule TAKE ONE CAPSULE BY MOUTH TWICE A DAY 60 capsule 0  . metFORMIN (GLUCOPHAGE) 500 MG tablet TAKE 1 TABLET BY MOUTH TWICE A DAY WITH A MEAL 60 tablet 3  . methadone (DOLOPHINE) 10 MG tablet Take 10 mg by mouth 3 (three) times daily.    . metoprolol succinate (TOPROL-XL) 25 MG 24 hr tablet TAKE 1 TABLET BY MOUTH EVERY DAY 30 tablet 5  . mupirocin cream (BACTROBAN) 2 % Apply 1 application topically 2 (two) times daily. 15 g 0  . Oxycodone HCl 20 MG TABS Take 1 tablet (20 mg total) by mouth 4 (four) times daily as needed (for pain). Take 1/2 - 1 tablet (10-20mg ) by mouth 2-4 times daily as needed for pain 30 tablet 0  . sitaGLIPtin (JANUVIA) 100 MG tablet Take 1 tablet (100 mg total) by mouth daily. 30 tablet 2  . sulfamethoxazole-trimethoprim (BACTRIM DS,SEPTRA DS) 800-160 MG per tablet Take 1 tablet by mouth 2 (two) times daily. 14 tablet 0  . vitamin B-12 (CYANOCOBALAMIN) 1000 MCG tablet Take 1,000 mcg by mouth daily.     No current facility-administered medications on file prior to visit.    BP 138/78 mmHg  Pulse 79   Temp(Src) 97.9 F (36.6 C) (Oral)  Ht 6' 2.5" (1.892 m)  Wt 303 lb 12.8 oz (137.803 kg)  BMI 38.50 kg/m2  SpO2 96%    Review of Systems  Constitutional: Negative for fever, chills and fatigue.  Respiratory: Negative for cough, chest tightness, shortness of breath and wheezing.   Cardiovascular: Negative for chest pain and  palpitations.  Genitourinary: Negative.   Skin:       Lt pretibial ulcer. As described on HPI.  Neurological: Negative.   Hematological: Negative for adenopathy. Does not bruise/bleed easily.  Psychiatric/Behavioral: Negative.        Objective:   Physical Exam   General- No acute distress. Lungs- CTA Heart-RRR Lt pretibial/lower extremity- approximately 20 mm wide area now. Wound looks drier today. Moderate deep 3-4 mm. Hyperpigmented skin in pretibial region. No redness or warmth. Dry yellow discharge at base of ulcer.        Assessment & Plan:           Assessment & Plan:

## 2014-04-24 NOTE — Patient Instructions (Addendum)
I did get wound culture today of the ulcer and placed unaboot. Still strongly advise  Wound Clinic evaluation  on Monday. You  agreed he would go to that appointment.  I am prescribing bactrim for next 3 days until you see wound care clinic.(unable to give levofloxin due to interaction methadone)  We gave you rocephin 1 gram im in office.  I am getting lipid panel, cmp, and cbc today. I am going to go ahead and refill your pravastatin and iron.  Follow up here in 1 month with pcp or as needed.

## 2014-04-24 NOTE — Assessment & Plan Note (Signed)
I did get wound culture today of the ulcer and placed unaboot. Still strongly advise  Wound Clinic evaluation  on Monday. Pt  agreed he would go to that appointment.  I am prescribing bactrim for next 3 days until you see wound care clinic.(unable to give levofloxin due to interaction methadone)  We gave you rocephin 1 gram im in office.

## 2014-04-24 NOTE — Progress Notes (Signed)
Pre visit review using our clinic review tool, if applicable. No additional management support is needed unless otherwise documented below in the visit note. 

## 2014-04-28 ENCOUNTER — Telehealth: Payer: Self-pay | Admitting: Family

## 2014-04-28 LAB — WOUND CULTURE: GRAM STAIN: NONE SEEN

## 2014-04-28 NOTE — Telephone Encounter (Signed)
emmi emailed °

## 2014-04-29 ENCOUNTER — Encounter: Payer: Self-pay | Admitting: Medical

## 2014-04-29 ENCOUNTER — Telehealth: Payer: Self-pay

## 2014-04-29 ENCOUNTER — Ambulatory Visit (INDEPENDENT_AMBULATORY_CARE_PROVIDER_SITE_OTHER): Payer: Medicare Other | Admitting: Medical

## 2014-04-29 ENCOUNTER — Ambulatory Visit: Payer: Medicare Other | Admitting: Medical

## 2014-04-29 VITALS — BP 116/73 | HR 80 | Temp 98.7°F | Ht 74.5 in | Wt 304.0 lb

## 2014-04-29 DIAGNOSIS — L97929 Non-pressure chronic ulcer of unspecified part of left lower leg with unspecified severity: Secondary | ICD-10-CM

## 2014-04-29 MED ORDER — SULFAMETHOXAZOLE-TRIMETHOPRIM 800-160 MG PO TABS: 1.0000 | ORAL_TABLET | Freq: Two times a day (BID) | ORAL | Status: DC

## 2014-04-29 NOTE — Assessment & Plan Note (Signed)
I re-wrapped patient leg with a una boot. I am prescribing more days of bactrim. It is not the ideal antibiotic per culture but (Cipro) the antibiotic I want to give has severe warning interaction with methadone.  Please go to Wound care center appointment this coming Monday. This is extremely important.  If you have worsenng signs or symptoms over the weekend then ED.(Particularly if you have any popliteal region pain at all).  Follow up with wound care on Monday. Before then ED if needed.  Note I did talk to the patient regarding my   reasoning regarding emergency department evaluation if he develops any popliteal pain or if his leg swells further despite my Unna boot wrapping today.. Patient verbalized understanding and expressing he would go to emergency department if such were to develop.  I did not think Doppler or emergency department evaluation was indicated tonight.

## 2014-04-29 NOTE — Patient Instructions (Addendum)
I re-wrapped your leg with a una boot. I am prescribing more days of bactrim. It is not the ideal antibiotic per culture but the one I want to give you has severe warning interaction with methadone.  Please go to Wound care center appointment this coming Monday. This is extremely important.  If you have worsenng signs or symptoms over the weekend then ED.(Particularly if you have any popliteal region pain at all).  Follow up with wound care on Monday. Before then ED if needed.

## 2014-04-29 NOTE — Progress Notes (Signed)
Pre visit review using our clinic review tool, if applicable. No additional management support is needed unless otherwise documented below in the visit note. 

## 2014-04-29 NOTE — Progress Notes (Signed)
Subjective:    Patient ID: [name] [name], male    DOB: 1951/03/22, 63 y.o.   MRN: [id number]  HPI   Pt in states he missed his appointment on Monday at the wound center. He states the wrap was coming a little loose and he did not like the way it felt. He wanted the Unna boot and wrapping to come up all the way to his knee.  No fever, no chills, no sweating. Patient wants the Unna boot replaced and since he did miss his appointment with the wound care clinic which is set was up for him this past Monday, I did want to see the wound before the Thanksgiving day holiday.  Patient reports no fevers, no chills, np  diaphoresis , no chest pain , shortness of breath or any popliteal pain.  Past Medical History  Diagnosis Date  . Vein, varicose   . Hyperlipidemia   . Varicose veins   . Complication of anesthesia     difficulty waking up  . Hypertension     Takes prinzide daily  . Diabetes mellitus     not consistent on taking medications  . Peripheral vascular disease   . Chronic kidney disease     acute renal failure in February admission  . Anemia     hx of blood transfusion  . Wound check, dressing change     dressing changes 3x/week; open wound  . Depression   . History of headache   . History of hepatitis C     competed treatment  . Neuromuscular disorder     gout and neuropathy  . History of blood clots 1986 or 87    blood clot in groin?  . Gout     History   Social History  . Marital Status: Single    Spouse Name: N/A    Number of Children: 5  . Years of Education: N/A   Occupational History  . Not on file.   Social History Main Topics  . Smoking status: Current Every Day Smoker -- 1.00 packs/day for 35 years    Types: Cigars    Last Attempt to Quit: 11/12/2011  . Smokeless tobacco: Never Used  . Alcohol Use: No  . Drug Use: No  . Sexual Activity: Not on file   Other Topics Concern  . Not on file   Social History Narrative   Regular exercise:  Walks a  little every day.   Lives with 62 yr old son.   Associated degree social services   Working on [employer].   Quit smoking few months ago.   [facility]es to [facility]- drug free since 1994.   Former Etoh abuse.    Past Surgical History  Procedure Laterality Date  . Varicose vein surgery    . Hemorrhoid surgery    . Vascular surgery  1980s    left leg  . Femoral-popliteal bypass graft  11/24/2011    Procedure: BYPASS GRAFT FEMORAL-POPLITEAL ARTERY;  Surgeon: Larina Earthlyodd F Early, MD;  [facility];  Service: Vascular;  Laterality: Right;    Family History  Problem Relation Age of Onset  . Cancer Mother     liver?  Marland Kitchen. Hyperlipidemia Father   . Hypertension Father   . Diabetes Father   . Alcohol abuse Father   . Arthritis Father   . Kidney disease Father     Allergies  Allergen Reactions  . Lisinopril     Hyperkalemia   . Percocet [Oxycodone-Acetaminophen]  Nausea And Vomiting  . Pork-Derived Products     Current Outpatient Prescriptions on File Prior to Visit  Medication Sig Dispense Refill  . amitriptyline (ELAVIL) 100 MG tablet Take 1 tablet (100 mg total) by mouth at bedtime. 30 tablet 5  . aspirin EC 81 MG tablet Take 81 mg by mouth daily.    . colchicine (COLCRYS) 0.6 MG tablet If gout flare take 2 tabs followed by 1 tab 1 hour later.  Then resume one tab by mouth daily. 40 tablet 1  . Ferrous Sulfate (IRON) 325 (65 FE) MG TABS Take 1 tablet by mouth daily. 30 each 2  . gabapentin (NEURONTIN) 800 MG tablet Take 1 tablet (800 mg total) by mouth 2 (two) times daily with a meal. 60 tablet 0  . hydrochlorothiazide (HYDRODIURIL) 25 MG tablet TAKE 1 TABLET BY MOUTH EVERY DAY 30 tablet 3  . LYRICA 300 MG capsule TAKE ONE CAPSULE BY MOUTH TWICE A DAY 60 capsule 0  . metFORMIN (GLUCOPHAGE) 500 MG tablet TAKE 1 TABLET BY MOUTH TWICE A DAY WITH A MEAL 60 tablet 3  . methadone (DOLOPHINE) 10 MG tablet Take 10 mg by mouth 3 (three) times daily.    . metoprolol succinate  (TOPROL-XL) 25 MG 24 hr tablet TAKE 1 TABLET BY MOUTH EVERY DAY 30 tablet 5  . mupirocin cream (BACTROBAN) 2 % Apply 1 application topically 2 (two) times daily. 15 g 0  . Oxycodone HCl 20 MG TABS Take 1 tablet (20 mg total) by mouth 4 (four) times daily as needed (for pain). Take 1/2 - 1 tablet (10-20mg ) by mouth 2-4 times daily as needed for pain 30 tablet 0  . pravastatin (PRAVACHOL) 20 MG tablet TAKE 1 TABLET BY MOUTH AT BEDTIME 90 tablet 0  . sitaGLIPtin (JANUVIA) 100 MG tablet Take 1 tablet (100 mg total) by mouth daily. 30 tablet 2  . sulfamethoxazole-trimethoprim (BACTRIM DS,SEPTRA DS) 800-160 MG per tablet Take 1 tablet by mouth 2 (two) times daily. 14 tablet 0  . sulfamethoxazole-trimethoprim (BACTRIM DS,SEPTRA DS) 800-160 MG per tablet Take 1 tablet by mouth 2 (two) times daily. 6 tablet 0  . vitamin B-12 (CYANOCOBALAMIN) 1000 MCG tablet Take 1,000 mcg by mouth daily.     No current facility-administered medications on file prior to visit.    BP 116/73 mmHg  Pulse 80  Temp(Src) 98.7 F (37.1 C) (Oral)  Ht 6' 2.5" (1.892 m)  Wt 304 lb (137.893 kg)  BMI 38.52 kg/m2      Review of Systems  Constitutional: Negative for fever, chills and fatigue.  HENT: Negative for trouble swallowing.   Respiratory: Negative for cough, chest tightness, shortness of breath and wheezing.   Cardiovascular: Negative for chest pain and palpitations.  Gastrointestinal: Negative for nausea, abdominal pain, diarrhea and rectal pain.  Musculoskeletal: Negative for back pain.       Left pretibial pain around the ulcer site. Patient has no popliteal pain at all. He mentions some swelling above the area of the [employer] boot. No redness or rash reported.  Skin:       Hyperpigmented skin around the pretibial ulcer site.  Neurological: Negative for dizziness, tremors, seizures, syncope, facial asymmetry, weakness, light-headedness, numbness and headaches.  Hematological: Negative for adenopathy. Does not  bruise/bleed easily.  Psychiatric/Behavioral: Negative for suicidal ideas, behavioral problems, self-injury and dysphoric mood. The patient is not nervous/anxious.        Objective:   Physical Exam   General-no acute distress Neck-full range of  motion, no nuchal rigidity, no lymphadenopathy and no JVD. Lungs-clear even and unlabored Heart-regular rate and rhythm Left lower extremity- patient appears to have some left upper calf edema just above the area with the Unna boot and compression bandaging was placed. He ulcer area is the same size  as last time but now the scab has cleared. The tissue tissue just underneath that prior scab looks clean and I do not see any yellow discharge. There is no redness surrounding the ulcer itself. But overall the skin does look hyperpigmented. Aggressive manipulation of the left lower extremity to induce a Homans sign was tried 3 times and he had no pain whatsoever in the popliteal region.         Assessment & Plan:  I did replace the una boot today and wrapped it up to about 2 inches below the patella. Ace wrap to that same level as well over the [employer].

## 2014-04-29 NOTE — Telephone Encounter (Signed)
Called pt to advise that he is overdue for eye exam and colonoscopy. VM full and unable to leave message for pt, no alternative number available. Will mail letter out to pt.

## 2014-05-01 ENCOUNTER — Telehealth: Payer: Self-pay | Admitting: Medical

## 2014-05-01 NOTE — Telephone Encounter (Signed)
I did talk with pt today at 10:45 am Friday 27th.. He states his leg feels a little better. The area around the wound site is feels better. No increase of edema.  No poplitea pain at all. No chest pain, No shortness or breath. I again advised pt any popliteal pain, any chest pain, or any shortness of breath then ED evaluation. Pt agreed and expressed understanding.

## 2014-05-04 ENCOUNTER — Encounter (HOSPITAL_BASED_OUTPATIENT_CLINIC_OR_DEPARTMENT_OTHER): Payer: Medicare Other | Attending: Plastic Surgery

## 2014-05-04 DIAGNOSIS — Z720 Tobacco use: Secondary | ICD-10-CM | POA: Diagnosis not present

## 2014-05-04 DIAGNOSIS — E119 Type 2 diabetes mellitus without complications: Secondary | ICD-10-CM | POA: Insufficient documentation

## 2014-05-04 DIAGNOSIS — I87332 Chronic venous hypertension (idiopathic) with ulcer and inflammation of left lower extremity: Secondary | ICD-10-CM | POA: Insufficient documentation

## 2014-05-04 DIAGNOSIS — Z951 Presence of aortocoronary bypass graft: Secondary | ICD-10-CM | POA: Insufficient documentation

## 2014-05-04 DIAGNOSIS — L97821 Non-pressure chronic ulcer of other part of left lower leg limited to breakdown of skin: Secondary | ICD-10-CM | POA: Diagnosis not present

## 2014-05-05 ENCOUNTER — Telehealth: Payer: Self-pay | Admitting: *Deleted

## 2014-05-05 ENCOUNTER — Telehealth: Payer: Self-pay | Admitting: Medical

## 2014-05-05 NOTE — Telephone Encounter (Signed)
[facility]. I called and verified with patient that he did request the braces. [facility] JG//CMA

## 2014-05-05 NOTE — Telephone Encounter (Signed)
Will ask LPN  Clydie BraunKaren to call pt and see if he went to wound care yesterday.

## 2014-05-05 NOTE — Consult Note (Signed)
[name] [name]:  [name], [name]                ACCOUNT NO.:  [id number]  MEDICAL RECORD NO.:  [id number]  LOCATION:  FOOT                         FACILITY:  [facility]  PHYSICIAN:  [name] FellowsBrinda Nadine Ryle, MD   DATE OF BIRTH:  August 19, 1950  DATE OF CONSULTATION:  05/04/2014 DATE OF DISCHARGE:                                CONSULTATION   CHIEF COMPLAINT:  Recurrent ulceration of left lower extremity.  HISTORY OF PRESENT ILLNESS:  The patient is a 63 year old ambulatory male with history of diabetes mellitus and venous stasis that presents with ulceration of left anterior lower extremity.  He reports that this wound has been present on and off since 1995.  He reports he did have a surgical intervention for his veins at some point, possibly  ligation.  However, he has continued to have intermittent opening of the wound.  He has been cared for most recently by his primary care physician who has had him in [employer] boots.  He has also had recent wound cultures completed which demonstrated a few Enterobacter.  He was placed on Bactrim; however, the patient reports that he has stopped this medication as he developed an outbreak on his hands.  He has had no associated fevers.  Prior to the reopening of this wound, the patient was not in any type of compression garments.  He states he has disability from chronic back pain and is on methadone.  He states that he has been taking his blood sugars at home and they run in the 130-140 range; however, review of the chart indicates the patient has not been compliant with his medications.  His last hemoglobin A1c dated on February 03, 2014 and was noted to be elevated to 8 from prior reading of 6.1.  The patient was a previous wound center patient for a diabetic foot ulceration over his right lateral foot.  He went on to heal this wound but states he has had chronic pain in the area of the wound and feels there may have been some type of mesh left inside of the wound. He  presents today in sandals.  He does not have a podiatrist.  He has had diabetic shoes filled in the past.  It is not certain how old these shoes are.  PAST MEDICAL HISTORY:  Includes diabetes mellitus, venous insufficiency with ulceration, hypertension, peripheral vascular disease, chronic kidney disease, depression, history of hepatitis C for which he has completed treatment, neuropathy associated with his diabetes mellitus and peripheral vascular disease, gout.  SOCIAL HISTORY:  The patient is a current everyday smoker and has been smoking for over 35 years.  Review of his chart indicates former alcohol and narcotic use.  However, the patient has been drug free since 1994.  PAST SURGICAL HISTORY:  Includes a femoral popliteal bypass in 2013 over the right lower extremity by Dr. Arbie CookeyEarly and some type of venous surgery over his left lower extremity in the 1980s.  LABORATORY DATA:  Studies include recent CBC with white blood cell count of 7.4, hemoglobin is 13.9, platelets are 180.  Metabolic panel includes a creatinine of 1.2, blood glucose of 117 and albumin of 3.7.  There is no recent prealbumin.  IMAGING:  Most recent imaging was of his right foot in January, 2015 which showed no foreign bodies, no bony past pathology.  PHYSICAL EXAMINATION:  Vital Signs:  Height is 6 feet 1 inch, weight is 300 pounds.  Blood pressure is 125/88, pulse is 89, temperature is 98.4, blood glucose is reported as 180.  ABI over the left lower extremity is calculated at 0.80.  Absence of sensation over the plantar 1st, 3rd, and 5th toes as well as 1st, 3rd, and 5th metatarsals.  The remainder of the tested points are positive on Semmes-Weinstein testing.  He has a similar exam over the right lower extremity.  Examination of the lower extremity reveals significant amount of callus and dry skin.  He has hyperpigmentation over the left lower distal extremity consistent with venous disease.  He has a  surgical scar over the medial calf or the left lower extremity, possibly from venous ligation.  Left calf circumference is measured as 49 cm.  Left ankle is 27 cm.  He has open wound over the anterior surface that is measured as 2.7 x 1.5 x 0.1 cm.  After application of topical anesthetic, curette was used to remove all the superficial slough over the entirety of the wound for selective debridement.  ASSESSMENT:  We will refer the patient to vascular and vein specialist for repeat ABI as well as venous duplex with reflux.  I counseled the patient that he needs to wear compression lifelong once the wound is healed.  We will institute Profore Lite compression at this point. Despite history of venous disease on this side and other changes consistent with venous disease, he also has underlying significant peripheral vascular disease.  Last ABI in 2013 was noted to be 0.83 on the left and 0.7 on the right lower extremity with biphasic waveforms. We will plan for followup in 1 week's time.  We also discussed smoking cessation.  We have also ordered a prealbumin.          ______________________________ [name] FellowsBrinda Branden Shallenberger, MD MBA     BT/MEDQ  D:  05/04/2014  T:  05/05/2014  Job:  [id number]

## 2014-05-06 NOTE — Telephone Encounter (Signed)
Called patient left message regarding would care. Ask to call back with status.

## 2014-05-07 ENCOUNTER — Encounter (HOSPITAL_BASED_OUTPATIENT_CLINIC_OR_DEPARTMENT_OTHER): Payer: Medicare Other | Attending: Plastic Surgery

## 2014-05-07 ENCOUNTER — Telehealth: Payer: Self-pay | Admitting: *Deleted

## 2014-05-07 DIAGNOSIS — I87332 Chronic venous hypertension (idiopathic) with ulcer and inflammation of left lower extremity: Secondary | ICD-10-CM | POA: Diagnosis not present

## 2014-05-07 DIAGNOSIS — L97921 Non-pressure chronic ulcer of unspecified part of left lower leg limited to breakdown of skin: Secondary | ICD-10-CM | POA: Diagnosis not present

## 2014-05-07 DIAGNOSIS — E669 Obesity, unspecified: Secondary | ICD-10-CM | POA: Insufficient documentation

## 2014-05-07 DIAGNOSIS — I890 Lymphedema, not elsewhere classified: Secondary | ICD-10-CM | POA: Diagnosis not present

## 2014-05-07 DIAGNOSIS — Z0000 Encounter for general adult medical examination without abnormal findings: Secondary | ICD-10-CM | POA: Diagnosis not present

## 2014-05-07 NOTE — Telephone Encounter (Signed)
Pt came by office.  He did go to his appointment for wound care 05/04/2014.  Pt said they need to check his blood to give the vascular center doctor regarding his blood flow to the wound. They [facility] for [facility], he is getting the blood work done today (05/07/2014) and goes back to wound center today for nurse visit.  He sees the doctor at the wound center again Monday, 05/11/2014.  Wound Center instructed him to see Vascular & Vein Specialist, his appointment is around 05/18/2014.

## 2014-05-07 NOTE — Telephone Encounter (Signed)
error 

## 2014-05-08 ENCOUNTER — Telehealth: Payer: Self-pay | Admitting: Medical

## 2014-05-08 NOTE — Telephone Encounter (Signed)
Reviewed pt note. Wound care and vascular handling.

## 2014-05-08 NOTE — Telephone Encounter (Signed)
Order faxed to [facility] at [phone]. JG//CMA 

## 2014-05-11 DIAGNOSIS — E669 Obesity, unspecified: Secondary | ICD-10-CM | POA: Diagnosis not present

## 2014-05-11 DIAGNOSIS — I87332 Chronic venous hypertension (idiopathic) with ulcer and inflammation of left lower extremity: Secondary | ICD-10-CM | POA: Diagnosis not present

## 2014-05-11 DIAGNOSIS — I890 Lymphedema, not elsewhere classified: Secondary | ICD-10-CM | POA: Diagnosis not present

## 2014-05-11 DIAGNOSIS — L97921 Non-pressure chronic ulcer of unspecified part of left lower leg limited to breakdown of skin: Secondary | ICD-10-CM | POA: Diagnosis not present

## 2014-05-12 ENCOUNTER — Other Ambulatory Visit: Payer: Self-pay | Admitting: Family

## 2014-05-12 NOTE — Telephone Encounter (Signed)
Refill granted.  Rx printed, signed and ready for fax.

## 2014-05-12 NOTE — Telephone Encounter (Signed)
Pt is requesting refill on Lyrica. Melissa Pt.   Last OV with Melissa: 02/03/2014, OV with Edward on 04/29/2014 Last Fill: 03/18/2014 # [id number] UDS: None  Please advise.

## 2014-05-12 NOTE — Telephone Encounter (Signed)
Faxed to [facility].  

## 2014-05-14 ENCOUNTER — Encounter (HOSPITAL_COMMUNITY): Payer: Self-pay | Admitting: Vascular Surgery

## 2014-05-15 DIAGNOSIS — M1A9XX0 Chronic gout, unspecified, without tophus (tophi): Secondary | ICD-10-CM | POA: Diagnosis not present

## 2014-05-15 DIAGNOSIS — M25579 Pain in unspecified ankle and joints of unspecified foot: Secondary | ICD-10-CM | POA: Diagnosis not present

## 2014-05-15 DIAGNOSIS — R262 Difficulty in walking, not elsewhere classified: Secondary | ICD-10-CM | POA: Diagnosis not present

## 2014-05-15 DIAGNOSIS — E1142 Type 2 diabetes mellitus with diabetic polyneuropathy: Secondary | ICD-10-CM | POA: Diagnosis not present

## 2014-05-18 ENCOUNTER — Encounter (HOSPITAL_COMMUNITY): Payer: Medicare Other

## 2014-05-18 ENCOUNTER — Telehealth: Payer: Self-pay | Admitting: *Deleted

## 2014-05-18 DIAGNOSIS — I890 Lymphedema, not elsewhere classified: Secondary | ICD-10-CM | POA: Diagnosis not present

## 2014-05-18 DIAGNOSIS — E669 Obesity, unspecified: Secondary | ICD-10-CM | POA: Diagnosis not present

## 2014-05-18 DIAGNOSIS — I87332 Chronic venous hypertension (idiopathic) with ulcer and inflammation of left lower extremity: Secondary | ICD-10-CM | POA: Diagnosis not present

## 2014-05-18 DIAGNOSIS — L97921 Non-pressure chronic ulcer of unspecified part of left lower leg limited to breakdown of skin: Secondary | ICD-10-CM | POA: Diagnosis not present

## 2014-05-18 NOTE — Telephone Encounter (Signed)
[facility] JG//CMA

## 2014-05-19 ENCOUNTER — Telehealth: Payer: Self-pay | Admitting: Family

## 2014-05-19 NOTE — Progress Notes (Signed)
[facility]  [name] [name], [name] [name]                ACCOUNT NO.:  [id number]  MEDICAL RECORD NO.:  [id number]      DATE OF BIRTH:  [birth year]  PHYSICIAN:  Wayland Denislaire Sanger, DO       VISIT DATE:  05/18/2014                                  OFFICE VISIT   The patient is a 63 year old male who is here for followup on his left anterior lower extremity ulcer.  He has undergone several different dressings here at the [facility] and has a chronic venous insufficiency.  PAST MEDICAL HISTORY:  His past medical history is positive for cataracts, anemia, sleep apnea, DVT, hypertension, peripheral artery disease, cirrhosis, hepatitis C, gout, neuropathy, peripheral vascular disease, depression, hyperlipidemia, and diabetes.  He has had femoral bypass, hemorrhoid surgery, and vein surgery.  MEDICATIONS:  His medications include Elavil, aspirin, colchicine, vitamin B, iron, Neurontin, HydroDIURIL, Glucophage, Toprol, oxycodone, Pravachol, Lyrica, Januvia, prednisone.  ALLERGIES:  LISINOPRIL, PERCOCET, and PORK-DERIVED PRODUCTS.  REVIEW OF SYSTEMS:  His medical conditions are being managed by his PCP at the moment.  PHYSICAL EXAMINATION:  GENERAL:  On exam, he is alert, oriented, and cooperative.  He seems to be a good historian. CHEST:  His breathing is unlabored. EXTREMITIES:  His upper extremity pulses are strong.  Lower extremity weak but present.  He has a little bit of swelling and redness.  There is a fibrous exudate over the wound which was debrided.  He has got some fibrous tissue under it with varicose veins and recommend continuing with the Santyl and Profore Lite.  Nutritionist for his pre- albumin of 14 elevation.  He has Juxta-Lite, so we asked him to bring those with him next week.  Diabetic sugar control and we will have him follow up with the nutritionist to help with the protein.     Wayland Denislaire Sanger, DO     CS/MEDQ  D:  05/18/2014  T:  05/19/2014   Job:  [id number]

## 2014-05-20 NOTE — Telephone Encounter (Signed)
Error/gd °

## 2014-05-25 ENCOUNTER — Encounter (HOSPITAL_COMMUNITY): Payer: Medicare Other

## 2014-05-26 DIAGNOSIS — I890 Lymphedema, not elsewhere classified: Secondary | ICD-10-CM | POA: Diagnosis not present

## 2014-05-26 DIAGNOSIS — E669 Obesity, unspecified: Secondary | ICD-10-CM | POA: Diagnosis not present

## 2014-05-26 DIAGNOSIS — L97921 Non-pressure chronic ulcer of unspecified part of left lower leg limited to breakdown of skin: Secondary | ICD-10-CM | POA: Diagnosis not present

## 2014-05-26 DIAGNOSIS — I87332 Chronic venous hypertension (idiopathic) with ulcer and inflammation of left lower extremity: Secondary | ICD-10-CM | POA: Diagnosis not present

## 2014-05-27 NOTE — Telephone Encounter (Signed)
[facility] [phone] successfully. JG//CMA

## 2014-06-01 ENCOUNTER — Other Ambulatory Visit: Payer: Self-pay

## 2014-06-01 ENCOUNTER — Other Ambulatory Visit: Payer: Self-pay | Admitting: Family

## 2014-06-01 DIAGNOSIS — E669 Obesity, unspecified: Secondary | ICD-10-CM | POA: Diagnosis not present

## 2014-06-01 DIAGNOSIS — L97921 Non-pressure chronic ulcer of unspecified part of left lower leg limited to breakdown of skin: Secondary | ICD-10-CM | POA: Diagnosis not present

## 2014-06-01 DIAGNOSIS — I87332 Chronic venous hypertension (idiopathic) with ulcer and inflammation of left lower extremity: Secondary | ICD-10-CM | POA: Diagnosis not present

## 2014-06-01 DIAGNOSIS — I890 Lymphedema, not elsewhere classified: Secondary | ICD-10-CM | POA: Diagnosis not present

## 2014-06-01 LAB — GLUCOSE, CAPILLARY: GLUCOSE-CAPILLARY: 141 mg/dL — AB (ref 70–99)

## 2014-06-01 MED ORDER — PRAVASTATIN SODIUM 20 MG PO TABS: ORAL_TABLET | ORAL | Status: DC

## 2014-06-01 NOTE — Telephone Encounter (Signed)
meds filled

## 2014-06-07 ENCOUNTER — Other Ambulatory Visit: Payer: Self-pay | Admitting: Family

## 2014-06-08 ENCOUNTER — Encounter (HOSPITAL_COMMUNITY): Payer: Medicare Other

## 2014-06-08 ENCOUNTER — Encounter (HOSPITAL_BASED_OUTPATIENT_CLINIC_OR_DEPARTMENT_OTHER): Payer: Medicare Other | Attending: Plastic Surgery

## 2014-06-08 DIAGNOSIS — E1151 Type 2 diabetes mellitus with diabetic peripheral angiopathy without gangrene: Secondary | ICD-10-CM | POA: Insufficient documentation

## 2014-06-08 DIAGNOSIS — L97821 Non-pressure chronic ulcer of other part of left lower leg limited to breakdown of skin: Secondary | ICD-10-CM | POA: Insufficient documentation

## 2014-06-08 DIAGNOSIS — I87332 Chronic venous hypertension (idiopathic) with ulcer and inflammation of left lower extremity: Secondary | ICD-10-CM | POA: Insufficient documentation

## 2014-06-08 DIAGNOSIS — E11621 Type 2 diabetes mellitus with foot ulcer: Secondary | ICD-10-CM | POA: Insufficient documentation

## 2014-06-08 DIAGNOSIS — L97421 Non-pressure chronic ulcer of left heel and midfoot limited to breakdown of skin: Secondary | ICD-10-CM | POA: Insufficient documentation

## 2014-06-10 DIAGNOSIS — L97821 Non-pressure chronic ulcer of other part of left lower leg limited to breakdown of skin: Secondary | ICD-10-CM | POA: Diagnosis not present

## 2014-06-10 DIAGNOSIS — L97421 Non-pressure chronic ulcer of left heel and midfoot limited to breakdown of skin: Secondary | ICD-10-CM | POA: Diagnosis not present

## 2014-06-10 DIAGNOSIS — I87332 Chronic venous hypertension (idiopathic) with ulcer and inflammation of left lower extremity: Secondary | ICD-10-CM | POA: Diagnosis not present

## 2014-06-10 DIAGNOSIS — E1151 Type 2 diabetes mellitus with diabetic peripheral angiopathy without gangrene: Secondary | ICD-10-CM | POA: Diagnosis not present

## 2014-06-10 DIAGNOSIS — E11621 Type 2 diabetes mellitus with foot ulcer: Secondary | ICD-10-CM | POA: Diagnosis not present

## 2014-06-14 ENCOUNTER — Other Ambulatory Visit: Payer: Self-pay | Admitting: Family

## 2014-06-15 NOTE — Telephone Encounter (Addendum)
Last filled: 06/08/14 Too soon to fill.

## 2014-06-17 DIAGNOSIS — E11621 Type 2 diabetes mellitus with foot ulcer: Secondary | ICD-10-CM | POA: Diagnosis not present

## 2014-06-17 DIAGNOSIS — L97821 Non-pressure chronic ulcer of other part of left lower leg limited to breakdown of skin: Secondary | ICD-10-CM | POA: Diagnosis not present

## 2014-06-17 DIAGNOSIS — M25579 Pain in unspecified ankle and joints of unspecified foot: Secondary | ICD-10-CM | POA: Diagnosis not present

## 2014-06-17 DIAGNOSIS — I87332 Chronic venous hypertension (idiopathic) with ulcer and inflammation of left lower extremity: Secondary | ICD-10-CM | POA: Diagnosis not present

## 2014-06-17 DIAGNOSIS — E1142 Type 2 diabetes mellitus with diabetic polyneuropathy: Secondary | ICD-10-CM | POA: Diagnosis not present

## 2014-06-17 DIAGNOSIS — L97421 Non-pressure chronic ulcer of left heel and midfoot limited to breakdown of skin: Secondary | ICD-10-CM | POA: Diagnosis not present

## 2014-06-17 DIAGNOSIS — R262 Difficulty in walking, not elsewhere classified: Secondary | ICD-10-CM | POA: Diagnosis not present

## 2014-06-17 DIAGNOSIS — M1A9XX0 Chronic gout, unspecified, without tophus (tophi): Secondary | ICD-10-CM | POA: Diagnosis not present

## 2014-06-22 ENCOUNTER — Encounter (HOSPITAL_COMMUNITY): Payer: Medicare Other

## 2014-06-22 ENCOUNTER — Other Ambulatory Visit (HOSPITAL_COMMUNITY): Payer: Self-pay | Admitting: Plastic Surgery

## 2014-06-22 DIAGNOSIS — L97929 Non-pressure chronic ulcer of unspecified part of left lower leg with unspecified severity: Secondary | ICD-10-CM

## 2014-07-09 ENCOUNTER — Other Ambulatory Visit: Payer: Self-pay | Admitting: Family

## 2014-07-09 NOTE — Telephone Encounter (Signed)
Patient due for office visit in December 2015 to follow up for gout/back pain.  Please schedule.

## 2014-07-10 ENCOUNTER — Other Ambulatory Visit: Payer: Self-pay | Admitting: *Deleted

## 2014-07-10 NOTE — Telephone Encounter (Signed)
[facility] requesting Order for lyrica 300mg .

## 2014-07-13 NOTE — Telephone Encounter (Signed)
Also received requests for:  januvia 100mg , lidocaine 5% ointment, metformin 500mg , amitriptyline 25mg , colchicine 0.6mg , HCTZ 25mg , pravastatin 20mg , accu-chek nano smartview meter, test strips, control solution and fastclix lancets, BD single use alcohol swab, metoprolol ER 25mg , mupirocin 2% cream, methylprednisolone 4mg .    How often should pt be checking glucose daily?  Current med list states amitriptyline is 100mg  and 25mg . Which dose is pt supposed to be taking?  Should pt get refill of lidocaine 5% ointment?  Please advise.

## 2014-07-14 DIAGNOSIS — M25579 Pain in unspecified ankle and joints of unspecified foot: Secondary | ICD-10-CM | POA: Diagnosis not present

## 2014-07-14 DIAGNOSIS — R262 Difficulty in walking, not elsewhere classified: Secondary | ICD-10-CM | POA: Diagnosis not present

## 2014-07-14 DIAGNOSIS — M1A9XX0 Chronic gout, unspecified, without tophus (tophi): Secondary | ICD-10-CM | POA: Diagnosis not present

## 2014-07-14 DIAGNOSIS — M545 Low back pain: Secondary | ICD-10-CM | POA: Diagnosis not present

## 2014-07-14 DIAGNOSIS — E1142 Type 2 diabetes mellitus with diabetic polyneuropathy: Secondary | ICD-10-CM | POA: Diagnosis not present

## 2014-07-14 MED ORDER — GLUCOSE BLOOD VI STRP: ORAL_STRIP | Status: DC

## 2014-07-14 MED ORDER — ACCU-CHEK FASTCLIX LANCETS MISC: Status: DC

## 2014-07-14 MED ORDER — METOPROLOL SUCCINATE ER 25 MG PO TB24: 25.0000 mg | ORAL_TABLET | Freq: Every day | ORAL | Status: DC

## 2014-07-14 MED ORDER — METFORMIN HCL 500 MG PO TABS: ORAL_TABLET | ORAL | Status: DC

## 2014-07-14 MED ORDER — PRAVASTATIN SODIUM 20 MG PO TABS: 20.0000 mg | ORAL_TABLET | Freq: Every day | ORAL | Status: DC

## 2014-07-14 MED ORDER — AMITRIPTYLINE HCL 25 MG PO TABS: ORAL_TABLET | ORAL | Status: DC

## 2014-07-14 MED ORDER — PREGABALIN 300 MG PO CAPS: 300.0000 mg | ORAL_CAPSULE | Freq: Two times a day (BID) | ORAL | Status: DC

## 2014-07-14 MED ORDER — MUPIROCIN CALCIUM 2 % EX CREA: 1.0000 "application " | TOPICAL_CREAM | Freq: Two times a day (BID) | CUTANEOUS | Status: DC

## 2014-07-14 MED ORDER — HYDROCHLOROTHIAZIDE 25 MG PO TABS: 25.0000 mg | ORAL_TABLET | Freq: Every day | ORAL | Status: DC

## 2014-07-14 MED ORDER — ACCU-CHEK SMARTVIEW CONTROL VI LIQD: Status: DC

## 2014-07-14 MED ORDER — SITAGLIPTIN PHOSPHATE 100 MG PO TABS: 100.0000 mg | ORAL_TABLET | Freq: Every day | ORAL | Status: DC

## 2014-07-14 MED ORDER — ACCU-CHEK NANO SMARTVIEW W/DEVICE KIT
PACK | Status: DC
Start: 2014-07-14 — End: 2014-12-22

## 2014-07-14 MED ORDER — COLCHICINE 0.6 MG PO TABS
ORAL_TABLET | ORAL | Status: DC
Start: 2014-07-14 — End: 2014-11-10

## 2014-07-14 NOTE — Telephone Encounter (Signed)
Looks like elavil 25mg  was sent on 2/4, I took 100mg  off the list.  Do not fill methoprednisolone.  Other refills ok.  I can only give one month at  time for lyrica for his local pharmacy.

## 2014-07-14 NOTE — Telephone Encounter (Signed)
Refills sent as below. Lyrica printed. [facility] that methylprednisolone, lyrica and lidocain 5% ointment (not on current med list) will not be sent. Attempted to reach pt, voicemail identifies pt's name but is set to spanish and am unable to leave message. Lyrica rx faxed to [facility].

## 2014-07-14 NOTE — Telephone Encounter (Signed)
Pt called back and was notified of below. Pt voices understanding.

## 2014-07-15 ENCOUNTER — Encounter (HOSPITAL_BASED_OUTPATIENT_CLINIC_OR_DEPARTMENT_OTHER): Payer: Medicare HMO | Attending: Surgery

## 2014-07-15 DIAGNOSIS — E1169 Type 2 diabetes mellitus with other specified complication: Secondary | ICD-10-CM | POA: Insufficient documentation

## 2014-07-15 DIAGNOSIS — E1140 Type 2 diabetes mellitus with diabetic neuropathy, unspecified: Secondary | ICD-10-CM | POA: Insufficient documentation

## 2014-07-15 DIAGNOSIS — M109 Gout, unspecified: Secondary | ICD-10-CM | POA: Insufficient documentation

## 2014-07-15 DIAGNOSIS — I739 Peripheral vascular disease, unspecified: Secondary | ICD-10-CM | POA: Insufficient documentation

## 2014-07-15 DIAGNOSIS — I872 Venous insufficiency (chronic) (peripheral): Secondary | ICD-10-CM | POA: Diagnosis not present

## 2014-07-15 DIAGNOSIS — F17200 Nicotine dependence, unspecified, uncomplicated: Secondary | ICD-10-CM | POA: Insufficient documentation

## 2014-07-15 DIAGNOSIS — L97822 Non-pressure chronic ulcer of other part of left lower leg with fat layer exposed: Secondary | ICD-10-CM | POA: Diagnosis not present

## 2014-07-15 DIAGNOSIS — I959 Hypotension, unspecified: Secondary | ICD-10-CM | POA: Diagnosis not present

## 2014-07-15 DIAGNOSIS — L84 Corns and callosities: Secondary | ICD-10-CM | POA: Diagnosis not present

## 2014-07-16 NOTE — H&P (Signed)
NAMLynn Ito:  Maruyama, Hogan                ACCOUNT NO.:  [id number]  MEDICAL RECORD NO.:  [id number]  LOCATION:  FOOT                         FACILITY:  [facility]  PHYSICIAN:  Evlyn KannerErrol Seirra Kos, MD       DATE OF BIRTH:  05-07-1951  DATE OF ADMISSION:  06/08/2014 DATE OF DISCHARGE:  06/08/2014                             HISTORY & PHYSICAL   HISTORY OF PRESENT ILLNESS:  This 64 year old gentleman who is known to the [facility] for several visits last year, comes back after the gap of couple of months.  His chief complaints are he has left lower extremity venous ulcers and has had this for several months.  He also has right lateral foot growth, which he says has been there for a while since he finished his hyperbaric oxygen treatment.  Other than that, the patient says his ulceration has been quite painful.  The patient has been noncompliant and has not visited the W[facility] Center on a regular basis.  Now, he kept his vascular appointments.  He was supposed to reschedule them and says he will do so.  PAST MEDICAL HISTORY: 1. Diabetes mellitus. 2. Venous insufficiency with ulceration. 3. Hypotension. 4. Peripheral vascular disease. 5. History of hepatitis C. 6. Diabetic neuropathy and gout.  PAST SURGICAL HISTORY:  He has had some vascular surgery done several years ago on the right side by Dr. Arbie CookeyEarly and has had some venous surgery done on the left side in the 1980s.  SOCIAL HISTORY:  He continued to smoke and has about 8 cigars a day. Does not drink alcohol and has not used narcotic drugs since 1994.  ALLERGIES:  LISINOPRIL, PERCOCET.  MEDICATIONS:  I have reviewed a list of his medications, which include amitriptyline, aspirin, ferrous sulfate, metformin, methadone, and local care with Santyl for his wound.  FAMILY HISTORY:  Nothing of significance.  REVIEW OF SYSTEMS:  I have reviewed his systems and all 12 systems are negative except for the right lower extremity where he has a  skin problem on his foot in the left lower extremity where he has problems with open wounds to the left lower extremity.  PHYSICAL EXAMINATION:  GENERAL:  On clinical examination, he is well nourished, well developed, awake and alert. VITAL SIGNS:  Height 6 feet and 1 inch, weight 300 pounds, vital signs are stable with pulse of 96, temperature of 98 degrees Fahrenheit, respiration 20, and blood pressure of 117/70. HEENT:  Oral cavity is normal. NECK:  Supple.  Sclerae are normal. CVS:  Normal heart sounds. RS:  Air entry equal both bases. EXTREMITIES:  Examination of the lower extremity reveals that he has a large callus on the base of his right foot laterally.  This callus is approximately 2.5 cm x 2.5 cm x 2 cm.  This will need debridement.  The ulceration on his left anterior leg reveals that he has two open ulcers on the left leg, which are 4.0 x 5.0 x 0.1 and this is wound #7 and then wound #8 is 7.0 x 1.5 x 0.2.  Both these will need debridement.  PROCEDURE NOTE:  Under appropriate time-out and local analgesia with lidocaine 4%, I have  done a sharp debridement of his callus with a #15 blade and forceps.  This was on his right foot laterally and the entire callus has been removed down to the level of his skin.  No active bleeding from this.  The left lower extremity, both his ulcerated venous ulcers have been curetted down to the subcutaneous area and this was done with minimal amount of bleeding.  This was controlled with pressure.  Other than that, the patient tolerated the procedure well.  ASSESSMENT: 1. Right foot callus, status post paring. 2. Left lower leg venous ulceration.  PLAN OF CARE: 1. I have recommended Santyl dressing to his left lower extremity and     continue using Juxta-Lite compression.  I have strongly advised him     smoking cessation and I have discussed this with him at length.  I     have also recommended to see his primary care doctor to try and  get     appropriate therapy for his smoking including nicotine patches or     tablets. 2. I have also recommended that he get back in touch with his vascular     surgeon for a vascular study both arterial and venous.  He is     agreeable about all this and will come back and see [place] next week.          ______________________________ Evlyn Kanner, MD     EB/MEDQ  D:  07/15/2014  T:  07/16/2014  Job:  [id number]  cc:   W[facility] Care Office

## 2014-07-22 DIAGNOSIS — L97822 Non-pressure chronic ulcer of other part of left lower leg with fat layer exposed: Secondary | ICD-10-CM | POA: Diagnosis not present

## 2014-07-23 NOTE — H&P (Signed)
[name] [name], [name] [name]                ACCOUNT NO.:  [id number]  MEDICAL RECORD NO.:  [id number]  LOCATION:                                 FACILITY:  PHYSICIAN:  Evlyn Kanner, MD       DATE OF BIRTH:  January 24, 1951  DATE OF ADMISSION: DATE OF DISCHARGE:                             HISTORY & PHYSICAL   SUBJECTIVE:  This 64 year old gentleman who was seen by me last week for a new visit comes back with a diagnosis of having a significant venous hypertension with left lower leg ulceration.  He also had a callus on the right foot laterally, which has done very well after the paring.  Since discussing with him in detail last week, he says he has cut down his smoking to 4 cigars a day, but has not yet completely given it up. I spent some time counseling him regarding this and I have recommended that he see his primary care doctor and possibly get oral medication or nicotine patch appropriately to help him.  He has also said that he has rescheduled his vascular appointment and has one for February 24th with the vascular surgeons for both arterial and venous Doppler.  Other than that, he has been doing fine and he has been doing his dressings daily with Santyl.  I have reviewed his H and P and nothing else has changed.  PHYSICAL EXAMINATION:  GENERAL:  On clinical examination, he is awake and alert, lying comfortably in bed. VITAL SIGNS:  Vital signs are stable.  He is 6 feet 1 inch in height, 300 pounds and is morbidly obese.  His temperature is 98.4, pulse 105, respirations 20, blood pressure of 170/99. EXTREMITIES:  On examination, the ulceration of both ulcers noted on his lower extremity on the left lower leg he has the ulceration and both have significant amount of slough.  The ulceration needs debridement sharply and I have recommended this.  PROCEDURE:  Under sterile precautions and with an appropriate time-out and lidocaine application, I have curetted this area ulcer #7 on the left  lower extremity 4.5 x 3.0 x 0.2 post debridement measurement. Ulcer #8 is 7.2 x 1.8 x 0.2 post debridement measurement.  This is a subcu debridement with some skin, subcutaneous tissue, and slough and eschar removed.  There was no active bleeding.  The patient tolerated the procedure with some complaints of pain, but is fine postprocedure. After cleaning this area significantly with saline, I have taken a fresh culture of the medial ulceration and sent it for culture.  ASSESSMENT: 1. Right foot callus status post paring, doing well. 2. Left lower leg venous ulceration with venous hypertension.  PLAN AND RECOMMENDATIONS:  He will continue using Santyl dressing on his left lower extremity along with Juxta-Lite compression.  As noted above, we have counseled him significantly regarding his smoking cessation, and I have asked him to get in touch with his primary care physician to get either appropriate oral or nicotine patches.  I have urged him to keep his appointment with his vascular surgeon, which is scheduled for July 29, 2014.  All questions were answered and we will see him back next week.  ______________________________ Evlyn KannerErrol Mayank Teuscher, MD     EB/MEDQ  D:  07/22/2014  T:  07/23/2014  Job:  [id number]  cc:   Wound Care Office

## 2014-07-29 ENCOUNTER — Ambulatory Visit (HOSPITAL_COMMUNITY)
Admission: RE | Admit: 2014-07-29 | Discharge: 2014-07-29 | Disposition: A | Discharge status: Home / Self Care | Payer: Medicare HMO | Source: Ambulatory Visit | Attending: Surgery | Admitting: Surgery

## 2014-07-29 ENCOUNTER — Ambulatory Visit (INDEPENDENT_AMBULATORY_CARE_PROVIDER_SITE_OTHER)
Admission: RE | Admit: 2014-07-29 | Discharge: 2014-07-29 | Disposition: A | Discharge status: Home / Self Care | Payer: Medicare HMO | Source: Ambulatory Visit | Attending: Plastic Surgery | Admitting: Plastic Surgery

## 2014-07-29 DIAGNOSIS — L97929 Non-pressure chronic ulcer of unspecified part of left lower leg with unspecified severity: Secondary | ICD-10-CM | POA: Diagnosis present

## 2014-08-11 ENCOUNTER — Telehealth: Payer: Self-pay | Admitting: Family

## 2014-08-11 NOTE — Telephone Encounter (Signed)
[facility] - Client TELEPHONE ADVICE RECORD [facility] Patient Name: [name] [name] DOB: 07-Feb-1951 Initial Comment Caller states he has broken out in blisters after starting antibiotic Nurse Assessment Guidelines Guideline Title Affirmed Question Affirmed Notes Final Disposition User Clinical Call CaroleenNorth, RN, Amy Comments HE GOT ANTIBIOTIC 2 MONTHS AGO AND STARTED ON ANTIBIOTIC AND STARTED HAVING BLISTERS ON FEET AND HANDS ON HIS KNUCKLES. HE STATES HE WENT TO THE WOUND CLINIC AND THEY PRESCRIBED IT AND THEN HE STARTED BREAKING OUT ON HIS FEET AND HANDS AGAIN. INSTRUCTED HIM TO CALL THE WOUND CLINIC AND INFORM THEM HE IS ALLERGIC TO THIS MEDICATION SO THEY CAN PRESCRIBE THE APPROPRIATE ONE FOR HIM TO AIDE IN HEALING OF HIS WOUNDS ON HIS LEGS. HE STATES THAT HE UNDERSTANDS AND WILL DO SO. INFORMED HIM WOULD SEND THIS MESSAGE OVER AND MAKE SURE IT IS PUT ON HIS RECORD THAT HE IS ALLERGIC TO THE MEDICATION - IT IS SULA MEDICATION APPEARS TO BE BACTRIM DS.

## 2014-08-11 NOTE — Telephone Encounter (Signed)
Noted. Medication added to patient's allergy list.  Phone note routed to provider.

## 2014-08-12 ENCOUNTER — Encounter (HOSPITAL_BASED_OUTPATIENT_CLINIC_OR_DEPARTMENT_OTHER): Payer: Medicare HMO | Attending: Surgery

## 2014-08-12 DIAGNOSIS — E11622 Type 2 diabetes mellitus with other skin ulcer: Secondary | ICD-10-CM | POA: Insufficient documentation

## 2014-08-12 DIAGNOSIS — I87332 Chronic venous hypertension (idiopathic) with ulcer and inflammation of left lower extremity: Secondary | ICD-10-CM | POA: Insufficient documentation

## 2014-08-12 DIAGNOSIS — L97821 Non-pressure chronic ulcer of other part of left lower leg limited to breakdown of skin: Secondary | ICD-10-CM | POA: Insufficient documentation

## 2014-08-12 LAB — GLUCOSE, CAPILLARY: GLUCOSE-CAPILLARY: 99 mg/dL (ref 70–99)

## 2014-08-19 DIAGNOSIS — I87332 Chronic venous hypertension (idiopathic) with ulcer and inflammation of left lower extremity: Secondary | ICD-10-CM | POA: Diagnosis not present

## 2014-08-24 ENCOUNTER — Encounter: Payer: Self-pay | Admitting: Vascular Surgery

## 2014-08-25 ENCOUNTER — Encounter: Payer: Self-pay | Admitting: Vascular Surgery

## 2014-08-25 ENCOUNTER — Ambulatory Visit (INDEPENDENT_AMBULATORY_CARE_PROVIDER_SITE_OTHER): Payer: Medicare HMO | Admitting: Vascular Surgery

## 2014-08-25 VITALS — BP 143/73 | HR 65 | Ht 74.5 in | Wt 294.6 lb

## 2014-08-25 DIAGNOSIS — I83029 Varicose veins of left lower extremity with ulcer of unspecified site: Secondary | ICD-10-CM

## 2014-08-25 DIAGNOSIS — I87312 Chronic venous hypertension (idiopathic) with ulcer of left lower extremity: Secondary | ICD-10-CM

## 2014-08-25 DIAGNOSIS — L97929 Non-pressure chronic ulcer of unspecified part of left lower leg with unspecified severity: Secondary | ICD-10-CM

## 2014-08-25 DIAGNOSIS — I83892 Varicose veins of left lower extremities with other complications: Secondary | ICD-10-CM | POA: Diagnosis not present

## 2014-08-25 NOTE — Progress Notes (Signed)
Patient presents today for evaluation of left leg wounds. He is well known to me from a prior right femoral to popliteal bypass for limb threatening ischemia and tissue loss on his right foot. Surgery was in 2013. He has done well and is maintained a patent bypass. He does have severe venous hypertension in his left leg. Has a history of a Reino Bellis procedure for perforator ligation in [place] dating back into the 1980s on his left leg. He does have changes consistent with chronic venous hypertension and recently has developed large venous stasis ulcers over the medial aspect on 2 different areas on his left calf. He is being treated appropriately at the wound center. Does have compression treatment for this as well.  Past Medical History  Diagnosis Date  . Vein, varicose   . Hyperlipidemia   . Varicose veins   . Complication of anesthesia     difficulty waking up  . Hypertension     Takes prinzide daily  . Diabetes mellitus     not consistent on taking medications  . Peripheral vascular disease   . Chronic kidney disease     acute renal failure in February admission  . Anemia     hx of blood transfusion  . Wound check, dressing change     dressing changes 3x/week; open wound  . Depression   . History of headache   . History of hepatitis C     competed treatment  . Neuromuscular disorder     gout and neuropathy  . History of blood clots 1986 or 87    blood clot in groin?  . Gout     History  Substance Use Topics  . Smoking status: Current Every Day Smoker -- 0.25 packs/day for 35 years    Types: Cigars, Cigarettes    Last Attempt to Quit: 11/12/2011  . Smokeless tobacco: Never Used  . Alcohol Use: No    Family History  Problem Relation Age of Onset  . Cancer Mother     liver?  Marland Kitchen Hyperlipidemia Father   . Hypertension Father   . Diabetes Father   . Alcohol abuse Father   . Arthritis Father   . Kidney disease Father     Allergies  Allergen Reactions  . Bactrim  [Sulfamethoxazole-Trimethoprim] Other (See Comments)    Blisters  . Lisinopril     Hyperkalemia   . Percocet [Oxycodone-Acetaminophen] Nausea And Vomiting  . Pork-Derived Products      Current outpatient prescriptions:  .  ACCU-CHEK FASTCLIX LANCETS MISC, Use to check blood sugar once a day. DX E11.9, Disp: 100 each, Rfl: 1 .  amitriptyline (ELAVIL) 25 MG tablet, TAKE 2-3 TABLETS BY MOUTH AS DIRECTED AT BEDTIME. DUE FOR APPT WITH MELISSA. [id number]., Disp: 270 tablet, Rfl: 1 .  aspirin EC 81 MG tablet, Take 81 mg by mouth daily., Disp: , Rfl:  .  Blood Glucose Calibration (ACCU-CHEK SMARTVIEW CONTROL) LIQD, Use as instructed for test strip control.  Dx E11.9, Disp: 3 each, Rfl: 1 .  Blood Glucose Monitoring Suppl (ACCU-CHEK NANO SMARTVIEW) W/DEVICE KIT, Use to check blood sugar once a day.  Dx E11.9, Disp: 1 kit, Rfl: 0 .  colchicine 0.6 MG tablet, IF GOUT FLARE TAKE 2 TABS FOLLOWED BY 1 TAB 1 HOUR LATER. THEN RESUME ONE TAB BY MOUTH DAILY., Disp: 120 tablet, Rfl: 1 .  Ferrous Sulfate (IRON) 325 (65 FE) MG TABS, Take 1 tablet by mouth daily., Disp: 30 each, Rfl: 2 .  gabapentin (  NEURONTIN) 800 MG tablet, Take 1 tablet (800 mg total) by mouth 2 (two) times daily with a meal., Disp: 60 tablet, Rfl: 0 .  glucose blood (ACCU-CHEK SMARTVIEW) test strip, Use to check blood sugar once a day.  Dx  E11.9, Disp: 100 each, Rfl: 1 .  hydrochlorothiazide (HYDRODIURIL) 25 MG tablet, Take 1 tablet (25 mg total) by mouth daily., Disp: 90 tablet, Rfl: 1 .  metFORMIN (GLUCOPHAGE) 500 MG tablet, TAKE 1 TABLET BY MOUTH TWICE A DAY WITH A MEAL, Disp: 180 tablet, Rfl: 1 .  methadone (DOLOPHINE) 10 MG tablet, Take 10 mg by mouth 3 (three) times daily., Disp: , Rfl:  .  metoprolol succinate (TOPROL-XL) 25 MG 24 hr tablet, Take 1 tablet (25 mg total) by mouth daily., Disp: 90 tablet, Rfl: 1 .  mupirocin cream (BACTROBAN) 2 %, Apply 1 application topically 2 (two) times daily., Disp: 45 g, Rfl: 1 .  Oxycodone HCl 20  MG TABS, Take 1 tablet (20 mg total) by mouth 4 (four) times daily as needed (for pain). Take 1/2 - 1 tablet (10-27m) by mouth 2-4 times daily as needed for pain, Disp: 30 tablet, Rfl: 0 .  pravastatin (PRAVACHOL) 20 MG tablet, Take 1 tablet (20 mg total) by mouth at bedtime., Disp: 90 tablet, Rfl: 1 .  pregabalin (LYRICA) 300 MG capsule, Take 1 capsule (300 mg total) by mouth 2 (two) times daily., Disp: 60 capsule, Rfl: 0 .  sitaGLIPtin (JANUVIA) 100 MG tablet, Take 1 tablet (100 mg total) by mouth daily., Disp: 90 tablet, Rfl: 1 .  sulfamethoxazole-trimethoprim (BACTRIM DS,SEPTRA DS) 800-160 MG per tablet, Take 1 tablet by mouth 2 (two) times daily., Disp: 14 tablet, Rfl: 0 .  sulfamethoxazole-trimethoprim (BACTRIM DS,SEPTRA DS) 800-160 MG per tablet, Take 1 tablet by mouth 2 (two) times daily., Disp: 6 tablet, Rfl: 0 .  sulfamethoxazole-trimethoprim (BACTRIM DS,SEPTRA DS) 800-160 MG per tablet, Take 1 tablet by mouth 2 (two) times daily., Disp: 10 tablet, Rfl: 0 .  vitamin B-12 (CYANOCOBALAMIN) 1000 MCG tablet, Take 1,000 mcg by mouth daily., Disp: , Rfl:   Filed Vitals:   08/25/14 1124  BP: 143/73  Pulse: 65  Height: 6' 2.5" (1.892 m)  Weight: 294 lb 9.6 oz (133.63 kg)  SpO2: 94%    Body mass index is 37.33 kg/(m^2).       On physical exam is a well-developed gentleman in no acute distress respirations are equal nonlabored. His pulse status is a 2+ dorsalis pedis pulse on the right. I do not palpate a palpable left pedal pulse. He does have a marked changes of venous hypertension with circumferential hemosiderin deposit was left calf and ankle. Does have 2 open wounds with good granulating bedin his medial left calf.  He did undergo noninvasive test studies 2 weeks ago and I reviewed this with him. This shows patent bypass on the right with normal ankle arm index and normal triphasic waveforms. On the left he has somewhat dampened waveforms but does have ankle arm index of 0.92. His  noninvasive studies show significantly dilated great saphenous vein on the left with some nonocclusive thrombus in the great saphenous vein. Also has reflux in his deep vein throughout its course from the common femoral, femoral vein and popliteal vein.  Impression and plan marked venous hypertension in his left leg with both superficial and deep venous incompetence. He is having a good result with compression garments and I would recommend continued of maximal wound care treatment as is currently being  done. Did explain the possible benefit of ablation of his great saphenous vein on the left to reduce his superficial venous reflux. We'll see him back in 3 months for continued discussion of this. Explained that this is not limb threatening since his arterial status is near normal on the left. We will see him again in 3 months for continued follow-up

## 2014-08-26 DIAGNOSIS — I87332 Chronic venous hypertension (idiopathic) with ulcer and inflammation of left lower extremity: Secondary | ICD-10-CM | POA: Diagnosis not present

## 2014-08-27 ENCOUNTER — Ambulatory Visit (INDEPENDENT_AMBULATORY_CARE_PROVIDER_SITE_OTHER): Payer: Medicare HMO | Admitting: Family

## 2014-08-27 ENCOUNTER — Encounter: Payer: Self-pay | Admitting: Family

## 2014-08-27 VITALS — BP 140/82 | HR 82 | Temp 98.2°F | Resp 16 | Ht 72.25 in | Wt 293.2 lb

## 2014-08-27 DIAGNOSIS — E1165 Type 2 diabetes mellitus with hyperglycemia: Secondary | ICD-10-CM | POA: Diagnosis not present

## 2014-08-27 DIAGNOSIS — L97929 Non-pressure chronic ulcer of unspecified part of left lower leg with unspecified severity: Secondary | ICD-10-CM

## 2014-08-27 DIAGNOSIS — IMO0002 Reserved for concepts with insufficient information to code with codable children: Secondary | ICD-10-CM

## 2014-08-27 DIAGNOSIS — E1169 Type 2 diabetes mellitus with other specified complication: Secondary | ICD-10-CM | POA: Diagnosis not present

## 2014-08-27 DIAGNOSIS — I10 Essential (primary) hypertension: Secondary | ICD-10-CM

## 2014-08-27 LAB — BASIC METABOLIC PANEL
BUN: 12 mg/dL (ref 6–23)
CO2: 27 mEq/L (ref 19–32)
Calcium: 9.2 mg/dL (ref 8.4–10.5)
Chloride: 106 mEq/L (ref 96–112)
Creatinine, Ser: 1.06 mg/dL (ref 0.40–1.50)
GFR: 90.74 mL/min (ref >60.00)
Glucose, Bld: 149 mg/dL — ABNORMAL HIGH (ref 70–99)
POTASSIUM: 4.1 meq/L (ref 3.5–5.1)
Sodium: 135 mEq/L (ref 135–145)

## 2014-08-27 LAB — HEMOGLOBIN A1C: Hgb A1c MFr Bld: 7.4 % — ABNORMAL HIGH (ref 4.6–6.5)

## 2014-08-27 NOTE — Addendum Note (Signed)
Addended by: Sandford Craze'SULLIVAN, Dajuan Turnley on: 08/27/2014 12:54 PM   Modules accepted: Level of Service, SmartSet

## 2014-08-27 NOTE — Assessment & Plan Note (Signed)
Clinically stable, obtain A1C, bmet.

## 2014-08-27 NOTE — Progress Notes (Signed)
Pre visit review using our clinic review tool, if applicable. No additional management support is needed unless otherwise documented below in the visit note. 

## 2014-08-27 NOTE — Assessment & Plan Note (Signed)
Fair BP on current meds. Continue same.  

## 2014-08-27 NOTE — Progress Notes (Addendum)
Subjective:    Patient ID: [name] [name], male    DOB: 08-16-1950, 64 y.o.   MRN: [id number]  HPI  Pt is currently maintained on the following medications for diabetes:stopped januvia after 3 days- due  Last A1C:   Lab Results  Component Value Date   HGBA1C 8.0 Repeated and verified X2.* 02/03/2014   Last diabetic eye exam was: 3/16-no DR per pt.  Denies polyuria/polydipsia. Denies hypoglycemia Home glucose readings range 100-120.  HTN- Patient is currently maintained on the following medications for blood pressure: hctz Patient reports good compliance with blood pressure medications. Patient denies chest pain, shortness of breath or swelling. Last 3 blood pressure readings in our office are as follows: BP Readings from Last 3 Encounters:  08/27/14 140/82  08/25/14 143/73  04/29/14 116/73    Wt Readings from Last 3 Encounters:  08/27/14 293 lb 3.2 oz (132.995 kg)  08/25/14 294 lb 9.6 oz (133.63 kg)  04/29/14 304 lb (137.893 kg)    Leg wounds- managed by wound care center, now in ulna bood.   Review of Systems See HPI  Past Medical History  Diagnosis Date  . Vein, varicose   . Hyperlipidemia   . Varicose veins   . Complication of anesthesia     difficulty waking up  . Hypertension     Takes prinzide daily  . Diabetes mellitus     not consistent on taking medications  . Peripheral vascular disease   . Chronic kidney disease     acute renal failure in February admission  . Anemia     hx of blood transfusion  . Wound check, dressing change     dressing changes 3x/week; open wound  . Depression   . History of headache   . History of hepatitis C     competed treatment  . Neuromuscular disorder     gout and neuropathy  . History of blood clots 1986 or 87    blood clot in groin?  . Gout     History   Social History  . Marital Status: Single    Spouse Name: N/A  . Number of Children: 5  . Years of Education: N/A   Occupational History  . Not on  file.   Social History Main Topics  . Smoking status: Current Every Day Smoker -- 0.25 packs/day for 35 years    Types: Cigars, Cigarettes    Last Attempt to Quit: 11/12/2011  . Smokeless tobacco: Never Used     Comment: 3 cigars a day  . Alcohol Use: No  . Drug Use: No  . Sexual Activity: Not on file   Other Topics Concern  . Not on file   Social History Narrative   Regular exercise:  Walks a little every day.   Lives with 84 yr old son.   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   Quit smoking few months ago.   Goes to [facility]- drug free since 1994.   Former Etoh abuse.    Past Surgical History  Procedure Laterality Date  . Varicose vein surgery    . Hemorrhoid surgery    . Vascular surgery  1980s    left leg  . Femoral-popliteal bypass graft  11/24/2011    Procedure: BYPASS GRAFT FEMORAL-POPLITEAL ARTERY;  Surgeon: Rosetta Posner, MD;  Location: [facility];  Service: Vascular;  Laterality: Right;  . Abdominal aortagram N/A 11/17/2011    Procedure: ABDOMINAL AORTAGRAM;  Surgeon: Sherren Mocha  Katina Dung, MD;  Location: [facility];  Service: Cardiovascular;  Laterality: N/A;    Family History  Problem Relation Age of Onset  . Cancer Mother     liver?  Marland Kitchen Hyperlipidemia Father   . Hypertension Father   . Diabetes Father   . Alcohol abuse Father   . Arthritis Father   . Kidney disease Father     Allergies  Allergen Reactions  . Bactrim [Sulfamethoxazole-Trimethoprim] Other (See Comments)    Blisters  . Lisinopril     Hyperkalemia   . Percocet [Oxycodone-Acetaminophen] Nausea And Vomiting  . Pork-Derived Products     Current Outpatient Prescriptions on File Prior to Visit  Medication Sig Dispense Refill  . ACCU-CHEK FASTCLIX LANCETS MISC Use to check blood sugar once a day. DX E11.9 100 each 1  . amitriptyline (ELAVIL) 25 MG tablet TAKE 2-3 TABLETS BY MOUTH AS DIRECTED AT BEDTIME. DUE FOR APPT WITH Sacoya Mcgourty. [id number]. 270 tablet 1  . aspirin  EC 81 MG tablet Take 81 mg by mouth daily.    . Blood Glucose Calibration (ACCU-CHEK SMARTVIEW CONTROL) LIQD Use as instructed for test strip control.  Dx E11.9 3 each 1  . Blood Glucose Monitoring Suppl (ACCU-CHEK NANO SMARTVIEW) W/DEVICE KIT Use to check blood sugar once a day.  Dx E11.9 1 kit 0  . colchicine 0.6 MG tablet IF GOUT FLARE TAKE 2 TABS FOLLOWED BY 1 TAB 1 HOUR LATER. THEN RESUME ONE TAB BY MOUTH DAILY. 120 tablet 1  . Ferrous Sulfate (IRON) 325 (65 FE) MG TABS Take 1 tablet by mouth daily. 30 each 2  . gabapentin (NEURONTIN) 800 MG tablet Take 1 tablet (800 mg total) by mouth 2 (two) times daily with a meal. 60 tablet 0  . glucose blood (ACCU-CHEK SMARTVIEW) test strip Use to check blood sugar once a day.  Dx  E11.9 100 each 1  . hydrochlorothiazide (HYDRODIURIL) 25 MG tablet Take 1 tablet (25 mg total) by mouth daily. 90 tablet 1  . metFORMIN (GLUCOPHAGE) 500 MG tablet TAKE 1 TABLET BY MOUTH TWICE A DAY WITH A MEAL 180 tablet 1  . methadone (DOLOPHINE) 10 MG tablet Take 10 mg by mouth 3 (three) times daily.    . metoprolol succinate (TOPROL-XL) 25 MG 24 hr tablet Take 1 tablet (25 mg total) by mouth daily. 90 tablet 1  . mupirocin cream (BACTROBAN) 2 % Apply 1 application topically 2 (two) times daily. 45 g 1  . Oxycodone HCl 20 MG TABS Take 1 tablet (20 mg total) by mouth 4 (four) times daily as needed (for pain). Take 1/2 - 1 tablet (10-$RemoveBefo'20mg'QoqdbfQKJkH$ ) by mouth 2-4 times daily as needed for pain 30 tablet 0  . pravastatin (PRAVACHOL) 20 MG tablet Take 1 tablet (20 mg total) by mouth at bedtime. 90 tablet 1  . pregabalin (LYRICA) 300 MG capsule Take 1 capsule (300 mg total) by mouth 2 (two) times daily. 60 capsule 0  . sitaGLIPtin (JANUVIA) 100 MG tablet Take 1 tablet (100 mg total) by mouth daily. 90 tablet 1  . sulfamethoxazole-trimethoprim (BACTRIM DS,SEPTRA DS) 800-160 MG per tablet Take 1 tablet by mouth 2 (two) times daily. 14 tablet 0  . sulfamethoxazole-trimethoprim (BACTRIM DS,SEPTRA  DS) 800-160 MG per tablet Take 1 tablet by mouth 2 (two) times daily. 6 tablet 0  . sulfamethoxazole-trimethoprim (BACTRIM DS,SEPTRA DS) 800-160 MG per tablet Take 1 tablet by mouth 2 (two) times daily. 10 tablet 0  . vitamin B-12 (CYANOCOBALAMIN) 1000 MCG tablet Take  1,000 mcg by mouth daily.     No current facility-administered medications on file prior to visit.    BP 140/82 mmHg  Pulse 82  Temp(Src) 98.2 F (36.8 C) (Oral)  Resp 16  Ht 6' 0.25" (1.835 m)  Wt 293 lb 3.2 oz (132.995 kg)  BMI 39.50 kg/m2  SpO2 99%       Objective:   Physical Exam  Constitutional: He is oriented to person, place, and time. He appears well-developed and well-nourished. No distress.  HENT:  Head: Normocephalic and atraumatic.  Cardiovascular: Normal rate and regular rhythm.   No murmur heard. Pulmonary/Chest: Effort normal. No respiratory distress. He has no rales.  Soft expiratory wheeze  Musculoskeletal: He exhibits no edema.  Neurological: He is alert and oriented to person, place, and time.  Skin: Skin is warm and dry.  LLE in unaboot wrap  Psychiatric: He has a normal mood and affect. His behavior is normal. Thought content normal.          Assessment & Plan:

## 2014-08-27 NOTE — Patient Instructions (Signed)
Please complete lab work prior to leaving. Follow up in 3 months.  

## 2014-08-27 NOTE — Assessment & Plan Note (Signed)
Pt reports that this is improving. This is being managed by the wound care center.

## 2014-08-28 ENCOUNTER — Telehealth: Payer: Self-pay | Admitting: Family

## 2014-08-28 MED ORDER — METFORMIN HCL 1000 MG PO TABS: 1000.0000 mg | ORAL_TABLET | Freq: Two times a day (BID) | ORAL | Status: DC

## 2014-08-28 NOTE — Telephone Encounter (Signed)
Sugar is above goal.  Increase metformin to 1000mg  twice daily.

## 2014-09-01 ENCOUNTER — Encounter: Payer: Medicare Other | Admitting: Vascular Surgery

## 2014-09-01 NOTE — Telephone Encounter (Signed)
Called left message to call back 

## 2014-09-02 ENCOUNTER — Telehealth: Payer: Self-pay | Admitting: *Deleted

## 2014-09-02 DIAGNOSIS — I87332 Chronic venous hypertension (idiopathic) with ulcer and inflammation of left lower extremity: Secondary | ICD-10-CM | POA: Diagnosis not present

## 2014-09-02 NOTE — Telephone Encounter (Signed)
-----   Message from Sandford CrazeMelissa O'Sullivan, NP sent at 08/27/2014 10:15 AM EDT ----- Per pt no DR on eye exam this week

## 2014-09-02 NOTE — Telephone Encounter (Signed)
Health maintenance updated

## 2014-09-03 NOTE — Telephone Encounter (Signed)
Called the patient informed of results and increase in Metformin.  The patient did verbalize an understanding of results and agree to increase his medication.

## 2014-09-09 ENCOUNTER — Encounter (HOSPITAL_BASED_OUTPATIENT_CLINIC_OR_DEPARTMENT_OTHER): Payer: Medicare HMO | Attending: Surgery

## 2014-09-09 DIAGNOSIS — L97521 Non-pressure chronic ulcer of other part of left foot limited to breakdown of skin: Secondary | ICD-10-CM | POA: Insufficient documentation

## 2014-09-09 DIAGNOSIS — L97221 Non-pressure chronic ulcer of left calf limited to breakdown of skin: Secondary | ICD-10-CM | POA: Insufficient documentation

## 2014-09-09 DIAGNOSIS — E11621 Type 2 diabetes mellitus with foot ulcer: Secondary | ICD-10-CM | POA: Insufficient documentation

## 2014-09-09 DIAGNOSIS — L97829 Non-pressure chronic ulcer of other part of left lower leg with unspecified severity: Secondary | ICD-10-CM | POA: Insufficient documentation

## 2014-09-09 DIAGNOSIS — I872 Venous insufficiency (chronic) (peripheral): Secondary | ICD-10-CM | POA: Insufficient documentation

## 2014-09-14 DIAGNOSIS — E11621 Type 2 diabetes mellitus with foot ulcer: Secondary | ICD-10-CM | POA: Diagnosis present

## 2014-09-14 DIAGNOSIS — L97521 Non-pressure chronic ulcer of other part of left foot limited to breakdown of skin: Secondary | ICD-10-CM | POA: Diagnosis not present

## 2014-09-14 DIAGNOSIS — L97829 Non-pressure chronic ulcer of other part of left lower leg with unspecified severity: Secondary | ICD-10-CM | POA: Diagnosis not present

## 2014-09-14 DIAGNOSIS — L97221 Non-pressure chronic ulcer of left calf limited to breakdown of skin: Secondary | ICD-10-CM | POA: Diagnosis not present

## 2014-09-14 DIAGNOSIS — I872 Venous insufficiency (chronic) (peripheral): Secondary | ICD-10-CM | POA: Diagnosis not present

## 2014-09-21 ENCOUNTER — Other Ambulatory Visit: Payer: Self-pay | Admitting: Family

## 2014-09-22 NOTE — Telephone Encounter (Signed)
Rx faxed to pharmacy  

## 2014-09-22 NOTE — Telephone Encounter (Signed)
Last Lyrica Rx 07/16/14. Pt has f/u 11/2014. Rx printed and forwarded to Provider for signature.

## 2014-09-23 DIAGNOSIS — E11621 Type 2 diabetes mellitus with foot ulcer: Secondary | ICD-10-CM | POA: Diagnosis not present

## 2014-09-30 DIAGNOSIS — E11621 Type 2 diabetes mellitus with foot ulcer: Secondary | ICD-10-CM | POA: Diagnosis not present

## 2014-10-07 ENCOUNTER — Encounter (HOSPITAL_BASED_OUTPATIENT_CLINIC_OR_DEPARTMENT_OTHER): Payer: Medicare HMO | Attending: Surgery

## 2014-10-07 DIAGNOSIS — Z87891 Personal history of nicotine dependence: Secondary | ICD-10-CM | POA: Insufficient documentation

## 2014-10-07 DIAGNOSIS — E11622 Type 2 diabetes mellitus with other skin ulcer: Secondary | ICD-10-CM | POA: Diagnosis not present

## 2014-10-07 DIAGNOSIS — I739 Peripheral vascular disease, unspecified: Secondary | ICD-10-CM | POA: Insufficient documentation

## 2014-10-07 DIAGNOSIS — I87332 Chronic venous hypertension (idiopathic) with ulcer and inflammation of left lower extremity: Secondary | ICD-10-CM | POA: Diagnosis not present

## 2014-10-07 DIAGNOSIS — L97921 Non-pressure chronic ulcer of unspecified part of left lower leg limited to breakdown of skin: Secondary | ICD-10-CM | POA: Diagnosis not present

## 2014-10-07 DIAGNOSIS — B1920 Unspecified viral hepatitis C without hepatic coma: Secondary | ICD-10-CM | POA: Insufficient documentation

## 2014-10-07 DIAGNOSIS — I872 Venous insufficiency (chronic) (peripheral): Secondary | ICD-10-CM | POA: Diagnosis not present

## 2014-10-07 DIAGNOSIS — E1140 Type 2 diabetes mellitus with diabetic neuropathy, unspecified: Secondary | ICD-10-CM | POA: Diagnosis not present

## 2014-10-09 ENCOUNTER — Telehealth: Payer: Self-pay | Admitting: Family

## 2014-10-09 NOTE — Telephone Encounter (Signed)
Caller name: [facility]  Relation to pt: Pharmacy  Call back number: [phone] ext [phone]   Reason for call:  Pharmacy called requesting a refill lidocaine (XYLOCAINE) 1 % injection

## 2014-10-09 NOTE — Telephone Encounter (Signed)
I do not see order on current med list and did not see previous scanned documents re: below request.  Transferred to Celeste's voicemail and left message to return my call.

## 2014-10-09 NOTE — Telephone Encounter (Signed)
Received return call from Robin. She states request is for lidocaine 5% ointment and that Melissa ok'd at the end of last year. I asked her to fax us a copy of the original order for verification. Awaiting fax.

## 2014-10-13 ENCOUNTER — Telehealth: Payer: Self-pay | Admitting: Family

## 2014-10-13 NOTE — Telephone Encounter (Signed)
Caller name: [facility]   Call back number: [phone] ext [phone]   Reason for call:  Pharmacy requesting refill   lidocaine

## 2014-10-13 NOTE — Telephone Encounter (Signed)
Pt states it helps some of his joint pains. He is applying it to his back, knees, elbows and ankles.  Please advise.

## 2014-10-13 NOTE — Telephone Encounter (Signed)
See 10/09/14 phone note.

## 2014-10-13 NOTE — Telephone Encounter (Signed)
Please ask pt where he is applying ointment.

## 2014-10-13 NOTE — Telephone Encounter (Signed)
Never received copy of order mentioned below. I see a blank order from 02/2014 scanned into [facility] with no signature.  Please advise?

## 2014-10-14 DIAGNOSIS — I87332 Chronic venous hypertension (idiopathic) with ulcer and inflammation of left lower extremity: Secondary | ICD-10-CM | POA: Diagnosis not present

## 2014-10-14 MED ORDER — LIDOCAINE 5 % EX OINT: 1.0000 "application " | TOPICAL_OINTMENT | Freq: Every day | CUTANEOUS | Status: DC | PRN

## 2014-10-14 NOTE — Telephone Encounter (Addendum)
rx printed

## 2014-10-14 NOTE — Telephone Encounter (Signed)
Verbal Rx given to Bed Bath & BeyondKelly Scientist, research (physical sciences)(pharmacist) at [facility], 50G x 5 refills.

## 2014-10-14 NOTE — Addendum Note (Signed)
Addended by: Sandford Craze'SULLIVAN, Lillyona Polasek on: 10/14/2014 01:06 PM   Modules accepted: Orders

## 2014-10-21 DIAGNOSIS — I87332 Chronic venous hypertension (idiopathic) with ulcer and inflammation of left lower extremity: Secondary | ICD-10-CM | POA: Diagnosis not present

## 2014-10-28 DIAGNOSIS — I87332 Chronic venous hypertension (idiopathic) with ulcer and inflammation of left lower extremity: Secondary | ICD-10-CM | POA: Diagnosis not present

## 2014-10-28 LAB — GLUCOSE, CAPILLARY: Glucose-Capillary: 91 mg/dL (ref 65–99)

## 2014-11-04 ENCOUNTER — Encounter (HOSPITAL_BASED_OUTPATIENT_CLINIC_OR_DEPARTMENT_OTHER): Payer: Medicare HMO | Attending: Surgery

## 2014-11-04 DIAGNOSIS — F17200 Nicotine dependence, unspecified, uncomplicated: Secondary | ICD-10-CM | POA: Insufficient documentation

## 2014-11-04 DIAGNOSIS — L97321 Non-pressure chronic ulcer of left ankle limited to breakdown of skin: Secondary | ICD-10-CM | POA: Diagnosis present

## 2014-11-04 DIAGNOSIS — B1920 Unspecified viral hepatitis C without hepatic coma: Secondary | ICD-10-CM | POA: Insufficient documentation

## 2014-11-04 DIAGNOSIS — I739 Peripheral vascular disease, unspecified: Secondary | ICD-10-CM | POA: Insufficient documentation

## 2014-11-04 DIAGNOSIS — E11622 Type 2 diabetes mellitus with other skin ulcer: Secondary | ICD-10-CM | POA: Diagnosis not present

## 2014-11-04 DIAGNOSIS — E1140 Type 2 diabetes mellitus with diabetic neuropathy, unspecified: Secondary | ICD-10-CM | POA: Insufficient documentation

## 2014-11-04 DIAGNOSIS — I70242 Atherosclerosis of native arteries of left leg with ulceration of calf: Secondary | ICD-10-CM | POA: Insufficient documentation

## 2014-11-04 DIAGNOSIS — I87332 Chronic venous hypertension (idiopathic) with ulcer and inflammation of left lower extremity: Secondary | ICD-10-CM | POA: Diagnosis not present

## 2014-11-09 ENCOUNTER — Ambulatory Visit (INDEPENDENT_AMBULATORY_CARE_PROVIDER_SITE_OTHER): Payer: Medicare HMO | Admitting: Family

## 2014-11-09 ENCOUNTER — Encounter: Payer: Self-pay | Admitting: Family

## 2014-11-09 VITALS — BP 125/62 | HR 82 | Temp 98.1°F | Resp 18 | Ht 72.25 in

## 2014-11-09 DIAGNOSIS — R21 Rash and other nonspecific skin eruption: Secondary | ICD-10-CM | POA: Diagnosis not present

## 2014-11-09 MED ORDER — BETAMETHASONE DIPROPIONATE 0.05 % EX CREA: TOPICAL_CREAM | Freq: Two times a day (BID) | CUTANEOUS | Status: DC

## 2014-11-09 NOTE — Progress Notes (Signed)
Pre visit review using our clinic review tool, if applicable. No additional management support is needed unless otherwise documented below in the visit note. 

## 2014-11-09 NOTE — Progress Notes (Signed)
Subjective:    Patient ID: [name] [name], male    DOB: 03-28-1951, 64 y.o.   MRN: [id number]  HPI  [name] [name] is a 64 yr old male who presents today to discuss blisters on his hands and feet.  Reports that blisters have been present since march.  Took bactrim back in march.  Reports that he continues with the wound care center for wound left foot- reports that wound is improving and "closing up."    Review of Systems    see HPI  Past Medical History  Diagnosis Date  . Vein, varicose   . Hyperlipidemia   . Varicose veins   . Complication of anesthesia     difficulty waking up  . Hypertension     Takes prinzide daily  . Diabetes mellitus     not consistent on taking medications  . Peripheral vascular disease   . Chronic kidney disease     acute renal failure in February admission  . Anemia     hx of blood transfusion  . Wound check, dressing change     dressing changes 3x/week; open wound  . Depression   . History of headache   . History of hepatitis C     competed treatment  . Neuromuscular disorder     gout and neuropathy  . History of blood clots 1986 or 87    blood clot in groin?  . Gout     History   Social History  . Marital Status: Single    Spouse Name: N/A  . Number of Children: 5  . Years of Education: N/A   Occupational History  . Not on file.   Social History Main Topics  . Smoking status: Current Every Day Smoker -- 0.25 packs/day for 35 years    Types: Cigars, Cigarettes    Last Attempt to Quit: 11/12/2011  . Smokeless tobacco: Never Used     Comment: 3 cigars a day  . Alcohol Use: No  . Drug Use: No  . Sexual Activity: Not on file   Other Topics Concern  . Not on file   Social History Narrative   Regular exercise:  Walks a little every day.   Lives with 25 yr old son.   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   Quit smoking few months ago.   Goes to [facility]- drug free since 1994.   Former Etoh  abuse.    Past Surgical History  Procedure Laterality Date  . Varicose vein surgery    . Hemorrhoid surgery    . Vascular surgery  1980s    left leg  . Femoral-popliteal bypass graft  11/24/2011    Procedure: BYPASS GRAFT FEMORAL-POPLITEAL ARTERY;  Surgeon: Rosetta Posner, MD;  Location: [facility];  Service: Vascular;  Laterality: Right;  . Abdominal aortagram N/A 11/17/2011    Procedure: ABDOMINAL AORTAGRAM;  Surgeon: Rosetta Posner, MD;  Location: [facility];  Service: Cardiovascular;  Laterality: N/A;    Family History  Problem Relation Age of Onset  . Cancer Mother     liver?  Marland Kitchen Hyperlipidemia Father   . Hypertension Father   . Diabetes Father   . Alcohol abuse Father   . Arthritis Father   . Kidney disease Father     Allergies  Allergen Reactions  . Bactrim [Sulfamethoxazole-Trimethoprim] Other (See Comments)    Blisters  . Lisinopril     Hyperkalemia   . Percocet [Oxycodone-Acetaminophen] Nausea  And Vomiting  . Pork-Derived Products     Current Outpatient Prescriptions on File Prior to Visit  Medication Sig Dispense Refill  . ACCU-CHEK FASTCLIX LANCETS MISC Use to check blood sugar once a day. DX E11.9 100 each 1  . amitriptyline (ELAVIL) 25 MG tablet TAKE 2-3 TABLETS BY MOUTH AS DIRECTED AT BEDTIME. DUE FOR APPT WITH Alithea Lapage. [id number]. 270 tablet 1  . aspirin EC 81 MG tablet Take 81 mg by mouth daily.    . Blood Glucose Calibration (ACCU-CHEK SMARTVIEW CONTROL) LIQD Use as instructed for test strip control.  Dx E11.9 3 each 1  . Blood Glucose Monitoring Suppl (ACCU-CHEK NANO SMARTVIEW) W/DEVICE KIT Use to check blood sugar once a day.  Dx E11.9 1 kit 0  . colchicine 0.6 MG tablet IF GOUT FLARE TAKE 2 TABS FOLLOWED BY 1 TAB 1 HOUR LATER. THEN RESUME ONE TAB BY MOUTH DAILY. 120 tablet 1  . gabapentin (NEURONTIN) 800 MG tablet Take 1 tablet (800 mg total) by mouth 2 (two) times daily with a meal. 60 tablet 0  . glucose blood (ACCU-CHEK SMARTVIEW) test strip Use to check  blood sugar once a day.  Dx  E11.9 100 each 1  . hydrochlorothiazide (HYDRODIURIL) 25 MG tablet Take 1 tablet (25 mg total) by mouth daily. 90 tablet 1  . lidocaine (XYLOCAINE) 5 % ointment Apply 1 application topically daily as needed. 50 g 5  . LYRICA 300 MG capsule TAKE 1 CAPSULE BY MOUTH TWICE DAILY 60 capsule 2  . metFORMIN (GLUCOPHAGE) 1000 MG tablet Take 1 tablet (1,000 mg total) by mouth 2 (two) times daily with a meal. 60 tablet 5  . methadone (DOLOPHINE) 10 MG tablet Take 10 mg by mouth 3 (three) times daily.    . metoprolol succinate (TOPROL-XL) 25 MG 24 hr tablet Take 1 tablet (25 mg total) by mouth daily. 90 tablet 1  . mupirocin cream (BACTROBAN) 2 % Apply 1 application topically 2 (two) times daily. 45 g 1  . Oxycodone HCl 20 MG TABS Take 1 tablet (20 mg total) by mouth 4 (four) times daily as needed (for pain). Take 1/2 - 1 tablet (10-64m) by mouth 2-4 times daily as needed for pain 30 tablet 0  . pravastatin (PRAVACHOL) 20 MG tablet Take 1 tablet (20 mg total) by mouth at bedtime. 90 tablet 1  . vitamin B-12 (CYANOCOBALAMIN) 1000 MCG tablet Take 1,000 mcg by mouth daily.    . Ferrous Sulfate (IRON) 325 (65 FE) MG TABS Take 1 tablet by mouth daily. (Patient not taking: Reported on 11/09/2014) 30 each 2   No current facility-administered medications on file prior to visit.    BP 125/62 mmHg  Pulse 82  Temp(Src) 98.1 F (36.7 C) (Oral)  Resp 18  Ht 6' 0.25" (1.835 m)  Wt   SpO2 93%    Objective:   Physical Exam  Constitutional: He appears well-developed and well-nourished. No distress.  Skin:  Dry appear rash with some pustular lesions noted on palms and right foot. L foot not examined. Has dressing per wound care center for healing left foot wound  Psychiatric: He has a normal mood and affect. His behavior is normal. Judgment and thought content normal.           Assessment & Plan:

## 2014-11-09 NOTE — Assessment & Plan Note (Signed)
rx with betamethasone cream, refer to derm. Suspect eczema.

## 2014-11-09 NOTE — Patient Instructions (Addendum)
You may apply betamethasone cream to hands and feet twice daily. You will be contacted about your referral to dermatology.  Please let us know if you have not heard back within 1 week about your referral. Follow up as scheduled.

## 2014-11-10 ENCOUNTER — Other Ambulatory Visit: Payer: Self-pay | Admitting: Family

## 2014-11-10 MED ORDER — METFORMIN HCL 1000 MG PO TABS: 1000.0000 mg | ORAL_TABLET | Freq: Two times a day (BID) | ORAL | Status: DC

## 2014-11-11 ENCOUNTER — Encounter: Payer: Self-pay | Admitting: Family

## 2014-11-11 ENCOUNTER — Telehealth: Payer: Self-pay | Admitting: Family

## 2014-11-11 DIAGNOSIS — L97321 Non-pressure chronic ulcer of left ankle limited to breakdown of skin: Secondary | ICD-10-CM | POA: Diagnosis not present

## 2014-11-11 NOTE — Telephone Encounter (Signed)
Relation to pt: self  Call back number: [phone] Pharmacy: [id number] - [place], [place] - [address]  Reason for call:  Pt requesting a refill LYRICA 300 MG capsule

## 2014-11-11 NOTE — Telephone Encounter (Signed)
Notified pt that he should still have 1 refill [facility] refill of 09/22/14, #60 x 2 refills. He will check with pharmacy.

## 2014-11-18 DIAGNOSIS — L97321 Non-pressure chronic ulcer of left ankle limited to breakdown of skin: Secondary | ICD-10-CM | POA: Diagnosis not present

## 2014-11-19 ENCOUNTER — Telehealth: Payer: Self-pay | Admitting: Family

## 2014-11-19 NOTE — Telephone Encounter (Signed)
[name] [name]--  Spoke with pt. He states that neuro and pain management doctor are the same person. Please obtain auth from insurance for below Provider. Diagnosis:  Chronic low back pain M54.5 and neuropathy  G62.9.

## 2014-11-19 NOTE — Telephone Encounter (Signed)
Caller name: Shuban Jubb Relationship to patient: self Can be reached: [phone]  Reason for call: Pt said that his Neurology office just informed him after going there for years that his insurance requires referral & prior auth for services. Pt is asking for referral - prior auth for Neurology, [facility]diagn[facility] in [place], Dr. Omar Person, Ph# [phone], Fax# [phone].

## 2014-11-19 NOTE — Telephone Encounter (Signed)
Need to get neurologist name. Left message for pt to return my call.

## 2014-11-19 NOTE — Telephone Encounter (Signed)
Dr. Brigid Re Tsering.

## 2014-11-20 ENCOUNTER — Encounter: Payer: Self-pay | Admitting: Vascular Surgery

## 2014-11-20 NOTE — Telephone Encounter (Signed)
We are not listed as PCP, pt aware to have changed

## 2014-11-23 NOTE — Telephone Encounter (Signed)
Pt called back stating that PCP has been changed with [facility] company to Dr. Danise Edge. Please send referral to Neurology.

## 2014-11-24 ENCOUNTER — Ambulatory Visit: Payer: Medicare HMO | Admitting: Vascular Surgery

## 2014-11-24 NOTE — Telephone Encounter (Signed)
Awaiting for patient to be added to [employer], will do referral once added

## 2014-11-24 NOTE — Telephone Encounter (Signed)
Pt is not a [employer] pt, contacted [employer], awaiting auth for approval. Ref# [id number], records faxed to [phone]

## 2014-11-25 DIAGNOSIS — L97321 Non-pressure chronic ulcer of left ankle limited to breakdown of skin: Secondary | ICD-10-CM | POA: Diagnosis not present

## 2014-11-26 LAB — GLUCOSE, CAPILLARY: Glucose-Capillary: 199 mg/dL — ABNORMAL HIGH (ref 65–99)

## 2014-11-30 ENCOUNTER — Ambulatory Visit (INDEPENDENT_AMBULATORY_CARE_PROVIDER_SITE_OTHER): Payer: Medicare HMO | Admitting: Family

## 2014-11-30 ENCOUNTER — Encounter: Payer: Self-pay | Admitting: Family

## 2014-11-30 VITALS — BP 140/86 | HR 96 | Temp 98.2°F | Ht 73.0 in | Wt 292.0 lb

## 2014-11-30 DIAGNOSIS — E1165 Type 2 diabetes mellitus with hyperglycemia: Secondary | ICD-10-CM

## 2014-11-30 DIAGNOSIS — IMO0002 Reserved for concepts with insufficient information to code with codable children: Secondary | ICD-10-CM

## 2014-11-30 DIAGNOSIS — E785 Hyperlipidemia, unspecified: Secondary | ICD-10-CM | POA: Diagnosis not present

## 2014-11-30 DIAGNOSIS — G894 Chronic pain syndrome: Secondary | ICD-10-CM

## 2014-11-30 DIAGNOSIS — L309 Dermatitis, unspecified: Secondary | ICD-10-CM

## 2014-11-30 DIAGNOSIS — E1169 Type 2 diabetes mellitus with other specified complication: Secondary | ICD-10-CM

## 2014-11-30 DIAGNOSIS — I10 Essential (primary) hypertension: Secondary | ICD-10-CM

## 2014-11-30 LAB — HEMOGLOBIN A1C: HEMOGLOBIN A1C: 7 % — AB (ref 4.6–6.5)

## 2014-11-30 LAB — BASIC METABOLIC PANEL
BUN: 13 mg/dL (ref 6–23)
CHLORIDE: 104 meq/L (ref 96–112)
CO2: 27 mEq/L (ref 19–32)
CREATININE: 1.03 mg/dL (ref 0.40–1.50)
Calcium: 9.1 mg/dL (ref 8.4–10.5)
GFR: 93.71 mL/min (ref >60.00)
Glucose, Bld: 153 mg/dL — ABNORMAL HIGH (ref 70–99)
Potassium: 3.7 mEq/L (ref 3.5–5.1)
Sodium: 136 mEq/L (ref 135–145)

## 2014-11-30 LAB — LDL CHOLESTEROL, DIRECT: Direct LDL: 118 mg/dL

## 2014-11-30 LAB — LIPID PANEL
Cholesterol: 210 mg/dL — ABNORMAL HIGH (ref 0–200)
HDL: 24.4 mg/dL — AB (ref >39.00)
NonHDL: 185.6
Total CHOL/HDL Ratio: 9
Triglycerides: 395 mg/dL — ABNORMAL HIGH (ref 0.0–149.0)
VLDL: 79 mg/dL — ABNORMAL HIGH (ref 0.0–40.0)

## 2014-11-30 MED ORDER — HYDROCHLOROTHIAZIDE 25 MG PO TABS: ORAL_TABLET | ORAL | Status: DC

## 2014-11-30 MED ORDER — FLUCONAZOLE 150 MG PO TABS: ORAL_TABLET | ORAL | Status: DC

## 2014-11-30 MED ORDER — PRAVASTATIN SODIUM 20 MG PO TABS
20.0000 mg | ORAL_TABLET | Freq: Every day | ORAL | Status: DC
Start: 2014-11-30 — End: 2014-12-02

## 2014-11-30 MED ORDER — METOPROLOL SUCCINATE ER 25 MG PO TB24: 25.0000 mg | ORAL_TABLET | Freq: Every day | ORAL | Status: DC

## 2014-11-30 MED ORDER — BETAMETHASONE DIPROPIONATE 0.05 % EX CREA: TOPICAL_CREAM | Freq: Two times a day (BID) | CUTANEOUS | Status: DC

## 2014-11-30 MED ORDER — LOSARTAN POTASSIUM 25 MG PO TABS: 25.0000 mg | ORAL_TABLET | Freq: Every day | ORAL | Status: DC

## 2014-11-30 NOTE — Assessment & Plan Note (Signed)
Fair sugar control. Obtain follow up A1C, continue metformin.

## 2014-11-30 NOTE — Assessment & Plan Note (Signed)
Fair bp, continue current meds.  

## 2014-11-30 NOTE — Assessment & Plan Note (Signed)
Improving with steroid cream. I think he may now also have a fungal type rash on forearms. rx with diflucan, advised pt to reschedule derm apt.

## 2014-11-30 NOTE — Progress Notes (Signed)
Subjective:    Patient ID: [name] [name], male    DOB: 02/26/1951, 64 y.o.   MRN: [id number]  HPI   Mr. Haddon is a 64 yr old male who presents today for follow up.  Patient presents today for follow up of multiple medical problems.  Diabetes Type 2  Pt is currently maintained on the following medications for diabetes: metformin  Lab Results  Component Value Date   HGBA1C 7.4* 08/27/2014   HGBA1C 8.0 Repeated and verified X2.* 02/03/2014   HGBA1C 6.1* 08/11/2013    Lab Results  Component Value Date   MICROALBUR 3.43* 08/11/2013   LDLCALC 109* 08/11/2013   CREATININE 1.06 08/27/2014    Last diabetic eye exam was 3/16- normal per patient Denies polyuria/polydipsia. Denies hypoglycemia Home glucose readings range: usually around 120, on occasion has gone up to 300.  Hyperlipidemia  Patient is currently maintained on the following medication for hyperlipidemia: pravastion Last lipid panel as follows:  Lab Results  Component Value Date   CHOL 244* 04/24/2014   HDL 27.60* 04/24/2014   LDLCALC 109* 08/11/2013   LDLDIRECT 151.5 04/24/2014   TRIG 399.0* 04/24/2014   CHOLHDL 9 04/24/2014   Patient denies myalgia. Patient reports good compliance with low fat/low cholesterol diet.   Hypertension  Patient is currently maintained on the following medications for blood pressure: hctz, toprol xl.   Patient reports good compliance with blood pressure medications. Patient denies chest pain, shortness of breath or swelling. Last 3 blood pressure readings in our office are as follows: BP Readings from Last 3 Encounters:  11/30/14 140/86  11/09/14 125/62  08/27/14 140/82         Review of Systems See HPI  Past Medical History  Diagnosis Date  . Vein, varicose   . Hyperlipidemia   . Varicose veins   . Complication of anesthesia     difficulty waking up  . Hypertension     Takes prinzide daily  . Diabetes mellitus     not consistent on taking medications  .  Peripheral vascular disease   . Chronic kidney disease     acute renal failure in February admission  . Anemia     hx of blood transfusion  . Wound check, dressing change     dressing changes 3x/week; open wound  . Depression   . History of headache   . History of hepatitis C     competed treatment  . Neuromuscular disorder     gout and neuropathy  . History of blood clots 1986 or 87    blood clot in groin?  . Gout     History   Social History  . Marital Status: Single    Spouse Name: N/A  . Number of Children: 5  . Years of Education: N/A   Occupational History  . Not on file.   Social History Main Topics  . Smoking status: Current Every Day Smoker -- 0.25 packs/day for 35 years    Types: Cigars, Cigarettes    Last Attempt to Quit: 11/12/2011  . Smokeless tobacco: Never Used     Comment: 3 cigars a day  . Alcohol Use: No  . Drug Use: No  . Sexual Activity: Not on file   Other Topics Concern  . Not on file   Social History Narrative   Regular exercise:  Walks a little every day.   Lives with 56 yr old son.   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   Quit smoking few months ago.   Goes to [employer] [facility]- drug free since 1994.   Former Etoh abuse.    Past Surgical History  Procedure Laterality Date  . Varicose vein surgery    . Hemorrhoid surgery    . Vascular surgery  1980s    left leg  . Femoral-popliteal bypass graft  11/24/2011    Procedure: BYPASS GRAFT FEMORAL-POPLITEAL ARTERY;  Surgeon: Rosetta Posner, MD;  Location: [facility];  Service: Vascular;  Laterality: Right;  . Abdominal aortagram N/A 11/17/2011    Procedure: ABDOMINAL AORTAGRAM;  Surgeon: Rosetta Posner, MD;  Location: [facility] CATH LAB;  Service: Cardiovascular;  Laterality: N/A;    Family History  Problem Relation Age of Onset  . Cancer Mother     liver?  Marland Kitchen Hyperlipidemia Father   . Hypertension Father   . Diabetes Father   . Alcohol abuse Father   . Arthritis Father    . Kidney disease Father     Allergies  Allergen Reactions  . Bactrim [Sulfamethoxazole-Trimethoprim] Other (See Comments)    Blisters  . Lisinopril     Hyperkalemia   . Percocet [Oxycodone-Acetaminophen] Nausea And Vomiting  . Pork-Derived Products     Current Outpatient Prescriptions on File Prior to Visit  Medication Sig Dispense Refill  . ACCU-CHEK FASTCLIX LANCETS MISC Use to check blood sugar once a day. DX E11.9 100 each 1  . amitriptyline (ELAVIL) 25 MG tablet TAKE 2 TO 3 TABLETS AS DIRECTED AT BEDTIME. DUE FOR APPT WITH Shakema Surita [phone]. 270 tablet 1  . aspirin EC 81 MG tablet Take 81 mg by mouth daily.    . Blood Glucose Calibration (ACCU-CHEK SMARTVIEW CONTROL) LIQD Use as instructed for test strip control.  Dx E11.9 3 each 1  . Blood Glucose Monitoring Suppl (ACCU-CHEK NANO SMARTVIEW) W/DEVICE KIT Use to check blood sugar once a day.  Dx E11.9 1 kit 0  . colchicine 0.6 MG tablet IF GOUT FLARE TAKE 2 TABLETS FOLLOWED BY 1 TABLET 1 HOUR LATER. THEN RESUME ONE TABLET DAILY. 120 tablet 1  . Ferrous Sulfate (IRON) 325 (65 FE) MG TABS Take 1 tablet by mouth daily. 30 each 2  . gabapentin (NEURONTIN) 800 MG tablet Take 1 tablet (800 mg total) by mouth 2 (two) times daily with a meal. 60 tablet 0  . glucose blood (ACCU-CHEK SMARTVIEW) test strip Use to check blood sugar once a day.  Dx  E11.9 100 each 1  . lidocaine (XYLOCAINE) 5 % ointment Apply 1 application topically daily as needed. 50 g 5  . LYRICA 300 MG capsule TAKE 1 CAPSULE BY MOUTH TWICE DAILY 60 capsule 2  . metFORMIN (GLUCOPHAGE) 1000 MG tablet Take 1 tablet (1,000 mg total) by mouth 2 (two) times daily with a meal. 180 tablet 1  . methadone (DOLOPHINE) 10 MG tablet Take 10 mg by mouth 3 (three) times daily.    . mupirocin cream (BACTROBAN) 2 % Apply 1 application topically 2 (two) times daily. 45 g 1  . Oxycodone HCl 20 MG TABS Take 1 tablet (20 mg total) by mouth 4 (four) times daily as needed (for pain). Take 1/2 -  1 tablet (10-$RemoveBefo'20mg'XcZERZjeZJM$ ) by mouth 2-4 times daily as needed for pain 30 tablet 0  . vitamin B-12 (CYANOCOBALAMIN) 1000 MCG tablet Take 1,000 mcg by mouth daily.     No current facility-administered medications on file prior to visit.    BP 140/86 mmHg  Pulse 96  Temp(Src) 98.2  F (36.8 C) (Oral)  Ht 6' 1" (1.854 m)  Wt 292 lb (132.45 kg)  BMI 38.53 kg/m2  SpO2 94%       Objective:   Physical Exam  Constitutional: He is oriented to person, place, and time. He appears well-developed and well-nourished. No distress.  HENT:  Head: Normocephalic and atraumatic.  Cardiovascular: Normal rate and regular rhythm.   No murmur heard. Pulmonary/Chest: Effort normal and breath sounds normal. No respiratory distress. He has no wheezes. He has no rales.  Musculoskeletal: He exhibits no edema.  Neurological: He is alert and oriented to person, place, and time.  Skin:  Peeling skin noted on bilateral forearms Eczema bilateral hands.   Psychiatric: He has a normal mood and affect. His behavior is normal. Thought content normal.          Assessment & Plan:

## 2014-11-30 NOTE — Patient Instructions (Addendum)
Please contact [facility] to reschedule your appointment. [phone] Start losartain 25mg  once daily for blood pressure and kidney protection.  Follow up in 2 weeks for BP recheck and blood work. (BMET) Start diflucan (one tab today and one tab in 1 week). Hold pravastatin for 2 weeks due to possible interaction with diflucan- then restart).

## 2014-11-30 NOTE — Progress Notes (Signed)
Pre visit review using our clinic review tool, if applicable. No additional management support is needed unless otherwise documented below in the visit note. 

## 2014-12-02 ENCOUNTER — Telehealth: Payer: Self-pay | Admitting: Family

## 2014-12-02 DIAGNOSIS — L97321 Non-pressure chronic ulcer of left ankle limited to breakdown of skin: Secondary | ICD-10-CM | POA: Diagnosis not present

## 2014-12-02 MED ORDER — ATORVASTATIN CALCIUM 40 MG PO TABS: 40.0000 mg | ORAL_TABLET | Freq: Every day | ORAL | Status: DC

## 2014-12-02 NOTE — Telephone Encounter (Signed)
Sugar control has improved. Cholesterol and triglycerides are high.  Work on low fat diet and work on avoiding concentrated sweets, and limiting white carbs (rice/bread/pasta/potatoes). Instead substitute whole grain versions with reasonable portions. Also, d/c pravastatin, start lipitor which is stronger.  Dont start lipitor until 1 week after his last dose of diflucan.

## 2014-12-03 NOTE — Telephone Encounter (Signed)
Notified patient of lab results.  Patient stated understanding and agreed.  Patient notified of Atorvastatin prescription to pick up and instructions, stated understanding.

## 2014-12-09 ENCOUNTER — Encounter (HOSPITAL_BASED_OUTPATIENT_CLINIC_OR_DEPARTMENT_OTHER): Payer: Commercial Managed Care - HMO | Attending: Surgery

## 2014-12-09 DIAGNOSIS — E11622 Type 2 diabetes mellitus with other skin ulcer: Secondary | ICD-10-CM | POA: Diagnosis present

## 2014-12-09 DIAGNOSIS — Z87891 Personal history of nicotine dependence: Secondary | ICD-10-CM | POA: Diagnosis not present

## 2014-12-09 DIAGNOSIS — E1140 Type 2 diabetes mellitus with diabetic neuropathy, unspecified: Secondary | ICD-10-CM | POA: Insufficient documentation

## 2014-12-09 DIAGNOSIS — L97821 Non-pressure chronic ulcer of other part of left lower leg limited to breakdown of skin: Secondary | ICD-10-CM | POA: Diagnosis not present

## 2014-12-09 DIAGNOSIS — B1920 Unspecified viral hepatitis C without hepatic coma: Secondary | ICD-10-CM | POA: Insufficient documentation

## 2014-12-09 DIAGNOSIS — E1151 Type 2 diabetes mellitus with diabetic peripheral angiopathy without gangrene: Secondary | ICD-10-CM | POA: Diagnosis not present

## 2014-12-09 DIAGNOSIS — I872 Venous insufficiency (chronic) (peripheral): Secondary | ICD-10-CM | POA: Diagnosis not present

## 2014-12-09 LAB — GLUCOSE, CAPILLARY: Glucose-Capillary: 221 mg/dL — ABNORMAL HIGH (ref 65–99)

## 2014-12-16 DIAGNOSIS — E11622 Type 2 diabetes mellitus with other skin ulcer: Secondary | ICD-10-CM | POA: Diagnosis not present

## 2014-12-16 LAB — GLUCOSE, CAPILLARY: GLUCOSE-CAPILLARY: 164 mg/dL — AB (ref 65–99)

## 2014-12-18 ENCOUNTER — Encounter: Payer: Self-pay | Admitting: Vascular Surgery

## 2014-12-21 ENCOUNTER — Telehealth: Payer: Self-pay | Admitting: Vascular Surgery

## 2014-12-21 NOTE — Telephone Encounter (Signed)
-----   Message from Kateri Mceborah S Thomas sent at 12/21/2014 11:15 AM EDT ----- Annabelle Harmanana, this [facility] has [facility] H[facility].  Per [facility] at [phone], he requires a referral and has not been seen by them.  His appointment is tomorrow. Thank you,  Eunice Blaseebbie

## 2014-12-21 NOTE — Telephone Encounter (Signed)
Spoke with pt to make aware, dpm

## 2014-12-22 ENCOUNTER — Other Ambulatory Visit: Payer: Self-pay

## 2014-12-22 ENCOUNTER — Ambulatory Visit: Payer: Medicare HMO | Admitting: Vascular Surgery

## 2014-12-22 MED ORDER — PREGABALIN 300 MG PO CAPS: 300.0000 mg | ORAL_CAPSULE | Freq: Two times a day (BID) | ORAL | Status: DC

## 2014-12-22 MED ORDER — LOSARTAN POTASSIUM 25 MG PO TABS: 25.0000 mg | ORAL_TABLET | Freq: Every day | ORAL | Status: DC

## 2014-12-22 MED ORDER — GLUCOSE BLOOD VI STRP: ORAL_STRIP | Status: DC

## 2014-12-22 MED ORDER — TRUEPLUS LANCETS 28G MISC: Status: DC

## 2014-12-22 MED ORDER — LIDOCAINE 5 % EX OINT: 1.0000 "application " | TOPICAL_OINTMENT | Freq: Every day | CUTANEOUS | Status: DC | PRN

## 2014-12-22 MED ORDER — BD SWAB SINGLE USE REGULAR PADS: MEDICATED_PAD | Status: DC

## 2014-12-22 MED ORDER — TRUE METRIX LEVEL 1 LOW VI SOLN: Status: DC

## 2014-12-22 MED ORDER — TRUE METRIX METER W/DEVICE KIT: PACK | Status: DC

## 2014-12-22 MED ORDER — GABAPENTIN 800 MG PO TABS: 800.0000 mg | ORAL_TABLET | Freq: Two times a day (BID) | ORAL | Status: DC

## 2014-12-22 MED ORDER — BD SWAB SINGLE USE REGULAR PADS: MEDICATED_PAD | Status: AC

## 2014-12-22 NOTE — Telephone Encounter (Signed)
Rx's below sent to [facility] in error, canceled Rx's with [facility], [facility] to [facility]. Lyrica Rx pending signature from provider.

## 2014-12-22 NOTE — Telephone Encounter (Signed)
Received faxes from [facility] requesting Rx's for: trumetrix meter, test strips, lancets, alcohol swabs, lyrica, sulfamethoxazole, betamethasone cream, fluconazole, and gabapentin. Refills sent for gapapentin, glucometer and testing supplies. Denial sent for betamethazone, fluconazole, and sulfamethaoxzole as these were one time Rx's only. Form filled out for lyrica and forwarded for provider signature.

## 2014-12-23 DIAGNOSIS — E11622 Type 2 diabetes mellitus with other skin ulcer: Secondary | ICD-10-CM | POA: Diagnosis not present

## 2014-12-23 NOTE — Telephone Encounter (Signed)
Lyrica ref[facility] to [facility].

## 2014-12-28 ENCOUNTER — Ambulatory Visit (INDEPENDENT_AMBULATORY_CARE_PROVIDER_SITE_OTHER): Payer: Medicare HMO

## 2014-12-28 VITALS — BP 120/60 | HR 88 | Temp 98.1°F | Ht 73.5 in | Wt 304.6 lb

## 2014-12-28 DIAGNOSIS — Z0000 Encounter for general adult medical examination without abnormal findings: Secondary | ICD-10-CM | POA: Diagnosis not present

## 2014-12-28 NOTE — Progress Notes (Signed)
Pre visit review using our clinic review tool, if applicable. No additional management support is needed unless otherwise documented below in the visit note. 

## 2014-12-28 NOTE — Patient Instructions (Addendum)
Call insurance company about coverage for Zostervax and Tetanus/Tdap.   Follow up with Sandford Craze as needed.    Fat and Cholesterol Control Diet Fat and cholesterol levels in your blood and organs are influenced by your diet. High levels of fat and cholesterol may lead to diseases of the heart, small and large blood vessels, gallbladder, liver, and pancreas. CONTROLLING FAT AND CHOLESTEROL WITH DIET Although exercise and lifestyle factors are important, your diet is key. That is because certain foods are known to raise cholesterol and others to lower it. The goal is to balance foods for their effect on cholesterol and more importantly, to replace saturated and trans fat with other types of fat, such as monounsaturated fat, polyunsaturated fat, and omega-3 fatty acids. On average, a person should consume no more than 15 to 17 g of saturated fat daily. Saturated and trans fats are considered "bad" fats, and they will raise LDL cholesterol. Saturated fats are primarily found in animal products such as meats, butter, and cream. However, that does not mean you need to give up all your favorite foods. Today, there are good tasting, low-fat, low-cholesterol substitutes for most of the things you like to eat. Choose low-fat or nonfat alternatives. Choose round or loin cuts of red meat. These types of cuts are lowest in fat and cholesterol. Chicken (without the skin), fish, veal, and ground [place] breast are great choices. Eliminate fatty meats, such as hot dogs and salami. Even shellfish have little or no saturated fat. Have a 3 oz (85 g) portion when you eat lean meat, poultry, or fish. Trans fats are also called "partially hydrogenated oils." They are oils that have been scientifically manipulated so that they are solid at room temperature resulting in a longer shelf life and improved taste and texture of foods in which they are added. Trans fats are found in stick margarine, some tub margarines, cookies,  crackers, and baked goods.  When baking and cooking, oils are a great substitute for butter. The monounsaturated oils are especially beneficial since it is believed they lower LDL and raise HDL. The oils you should avoid entirely are saturated tropical oils, such as coconut and palm.  Remember to eat a lot from food groups that are naturally free of saturated and trans fat, including fish, fruit, vegetables, beans, grains (barley, rice, couscous, bulgur wheat), and pasta (without cream sauces).  IDENTIFYING FOODS THAT LOWER FAT AND CHOLESTEROL  Soluble fiber may lower your cholesterol. This type of fiber is found in fruits such as apples, vegetables such as broccoli, potatoes, and carrots, legumes such as beans, peas, and lentils, and grains such as barley. Foods fortified with plant sterols (phytosterol) may also lower cholesterol. You should eat at least 2 g per day of these foods for a cholesterol lowering effect.  Read package labels to identify low-saturated fats, trans fat free, and low-fat foods at the supermarket. Select cheeses that have only 2 to 3 g saturated fat per ounce. Use a heart-healthy tub margarine that is free of trans fats or partially hydrogenated oil. When buying baked goods (cookies, crackers), avoid partially hydrogenated oils. Breads and muffins should be made from whole grains (whole-wheat or whole oat flour, instead of "flour" or "enriched flour"). Buy non-creamy canned soups with reduced salt and no added fats.  FOOD PREPARATION TECHNIQUES  Never deep-fry. If you must fry, either stir-fry, which uses very little fat, or use non-stick cooking sprays. When possible, broil, bake, or roast meats, and steam vegetables. Instead  of putting butter or margarine on vegetables, use lemon and herbs, applesauce, and cinnamon (for squash and sweet potatoes). Use nonfat yogurt, salsa, and low-fat dressings for salads.  LOW-SATURATED FAT / LOW-FAT FOOD SUBSTITUTES Meats / Saturated Fat  (g)  Avoid: Steak, marbled (3 oz/85 g) / 11 g  Choose: Steak, lean (3 oz/85 g) / 4 g  Avoid: Hamburger (3 oz/85 g) / 7 g  Choose: Hamburger, lean (3 oz/85 g) / 5 g  Avoid: Ham (3 oz/85 g) / 6 g  Choose: Ham, lean cut (3 oz/85 g) / 2.4 g  Avoid: Chicken, with skin, dark meat (3 oz/85 g) / 4 g  Choose: Chicken, skin removed, dark meat (3 oz/85 g) / 2 g  Avoid: Chicken, with skin, light meat (3 oz/85 g) / 2.5 g  Choose: Chicken, skin removed, light meat (3 oz/85 g) / 1 g Dairy / Saturated Fat (g)  Avoid: Whole milk (1 cup) / 5 g  Choose: Low-fat milk, 2% (1 cup) / 3 g  Choose: Low-fat milk, 1% (1 cup) / 1.5 g  Choose: Skim milk (1 cup) / 0.3 g  Avoid: Hard cheese (1 oz/28 g) / 6 g  Choose: Skim milk cheese (1 oz/28 g) / 2 to 3 g  Avoid: Cottage cheese, 4% fat (1 cup) / 6.5 g  Choose: Low-fat cottage cheese, 1% fat (1 cup) / 1.5 g  Avoid: Ice cream (1 cup) / 9 g  Choose: Sherbet (1 cup) / 2.5 g  Choose: Nonfat frozen yogurt (1 cup) / 0.3 g  Choose: Frozen fruit bar / trace  Avoid: Whipped cream (1 tbs) / 3.5 g  Choose: Nondairy whipped topping (1 tbs) / 1 g Condiments / Saturated Fat (g)  Avoid: Mayonnaise (1 tbs) / 2 g  Choose: Low-fat mayonnaise (1 tbs) / 1 g  Avoid: Butter (1 tbs) / 7 g  Choose: Extra light margarine (1 tbs) / 1 g  Avoid: Coconut oil (1 tbs) / 11.8 g  Choose: Olive oil (1 tbs) / 1.8 g  Choose: Corn oil (1 tbs) / 1.7 g  Choose: Safflower oil (1 tbs) / 1.2 g  Choose: Sunflower oil (1 tbs) / 1.4 g  Choose: Soybean oil (1 tbs) / 2.4 g  Choose: Canola oil (1 tbs) / 1 g Document Released: 05/22/2005 Document Revised: 09/16/2012 Document Reviewed: 08/20/2013 [employer] Patient Information 2015 [place], [place]. This information is not intended to replace advice given to you by your health care provider. Make sure you discuss any questions you have with your health care provider.  Fall Prevention and Home Safety Falls cause injuries  and can affect all age groups. It is possible to prevent falls.  HOW TO PREVENT FALLS  Wear shoes with rubber soles that do not have an opening for your toes.  Keep the inside and outside of your house well lit.  Use night lights throughout your home.  Remove clutter from floors.  Clean up floor spills.  Remove throw rugs or fasten them to the floor with carpet tape.  Do not place electrical cords across pathways.  Put grab bars by your tub, shower, and toilet. Do not use towel bars as grab bars.  Put handrails on both sides of the stairway. Fix loose handrails.  Do not climb on stools or stepladders, if possible.  Do not wax your floors.  Repair uneven or unsafe sidewalks, walkways, or stairs.  Keep items you use a lot within reach.  Be aware of  pets.  Keep emergency numbers next to the telephone.  Put smoke detectors in your home and near bedrooms. Ask your doctor what other things you can do to prevent falls. Document Released: 03/18/2009 Document Revised: 11/21/2011 Document Reviewed: 08/22/2011 [facility] Patient Information 2015 [place], [place]. This information is not intended to replace advice given to you by your health care provider. Make sure you discuss any questions you have with your health care provider.  Smoking Cessation, Tips for Success If you are ready to quit smoking, congratulations! You have chosen to help yourself be healthier. Cigarettes bring nicotine, tar, carbon monoxide, and other irritants into your body. Your lungs, heart, and blood vessels will be able to work better without these poisons. There are many different ways to quit smoking. Nicotine gum, nicotine patches, a nicotine inhaler, or nicotine nasal spray can help with physical craving. Hypnosis, support groups, and medicines help break the habit of smoking. WHAT THINGS CAN I DO TO MAKE QUITTING EASIER?  Here are some tips to help you quit for good:  Pick a date when you will quit smoking  completely. Tell all of your friends and family about your plan to quit on that date.  Do not try to slowly cut down on the number of cigarettes you are smoking. Pick a quit date and quit smoking completely starting on that day.  Throw away all cigarettes.   Clean and remove all ashtrays from your home, work, and car.  On a card, write down your reasons for quitting. Carry the card with you and read it when you get the urge to smoke.  Cleanse your body of nicotine. Drink enough water and fluids to keep your urine clear or pale yellow. Do this after quitting to flush the nicotine from your body.  Learn to predict your moods. Do not let a bad situation be your excuse to have a cigarette. Some situations in your life might tempt you into wanting a cigarette.  Never have "just one" cigarette. It leads to wanting another and another. Remind yourself of your decision to quit.  Change habits associated with smoking. If you smoked while driving or when feeling stressed, try other activities to replace smoking. Stand up when drinking your coffee. Brush your teeth after eating. Sit in a different chair when you read the paper. Avoid alcohol while trying to quit, and try to drink fewer caffeinated beverages. Alcohol and caffeine may urge you to smoke.  Avoid foods and drinks that can trigger a desire to smoke, such as sugary or spicy foods and alcohol.  Ask people who smoke not to smoke around you.  Have something planned to do right after eating or having a cup of coffee. For example, plan to take a walk or exercise.  Try a relaxation exercise to calm you down and decrease your stress. Remember, you may be tense and nervous for the first 2 weeks after you quit, but this will pass.  Find new activities to keep your hands busy. Play with a pen, coin, or rubber band. Doodle or draw things on paper.  Brush your teeth right after eating. This will help cut down on the craving for the taste of tobacco  after meals. You can also try mouthwash.   Use oral substitutes in place of cigarettes. Try using lemon drops, carrots, cinnamon sticks, or chewing gum. Keep them handy so they are available when you have the urge to smoke.  When you have the urge to smoke, try deep  breathing.  Designate your home as a nonsmoking area.  If you are a heavy smoker, ask your health care provider about a prescription for nicotine chewing gum. It can ease your withdrawal from nicotine.  Reward yourself. Set aside the cigarette money you save and buy yourself something nice.  Look for support from others. Join a support group or smoking cessation program. Ask someone at home or at work to help you with your plan to quit smoking.  Always ask yourself, "Do I need this cigarette or is this just a reflex?" Tell yourself, "Today, I choose not to smoke," or "I do not want to smoke." You are reminding yourself of your decision to quit.  Do not replace cigarette smoking with electronic cigarettes (commonly called e-cigarettes). The safety of e-cigarettes is unknown, and some may contain harmful chemicals.  If you relapse, do not give up! Plan ahead and think about what you will do the next time you get the urge to smoke. HOW WILL I FEEL WHEN I QUIT SMOKING? You may have symptoms of withdrawal because your body is used to nicotine (the addictive substance in cigarettes). You may crave cigarettes, be irritable, feel very hungry, cough often, get headaches, or have difficulty concentrating. The withdrawal symptoms are only temporary. They are strongest when you first quit but will go away within 10-14 days. When withdrawal symptoms occur, stay in control. Think about your reasons for quitting. Remind yourself that these are signs that your body is healing and getting used to being without cigarettes. Remember that withdrawal symptoms are easier to treat than the major diseases that smoking can cause.  Even after the withdrawal  is over, expect periodic urges to smoke. However, these cravings are generally short lived and will go away whether you smoke or not. Do not smoke! WHAT RESOURCES ARE AVAILABLE TO HELP ME QUIT SMOKING? Your health care provider can direct you to community resources or hospitals for support, which may include:  Group support.  Education.  Hypnosis.  Therapy. Document Released: 02/18/2004 Document Revised: 10/06/2013 Document Reviewed: 11/07/2012 [facility] Patient Information 2015 [place], [place]. This information is not intended to replace advice given to you by your health care provider. Make sure you discuss any questions you have with your health care provider.

## 2014-12-28 NOTE — Progress Notes (Signed)
Subjective:   [name] [name] is a 64 y.o. male who presents for an Initial Medicare Annual Wellness Visit.  Review of Systems   Risk Factors:  DM II-on Metformin, pt given Verio Flex glucometer during visit to assist with taking BS.   Hypertension- on hydrochlorothiazide, losartan, metoprolol succinate  Hyperlipidemia- on atorvastatin    BMI:  38.53, has gained 12 lbs since last OV 11/30/14.    Sleep patterns:  8 hours per night Fall Risk:  HIGH risk  ADLs (bathing, grooming/dressing, walking, toileting):  Independent IADLs (shopping, housekeeping, medication management, handling finances):  Independent  Incontinence:  Continent of bowel and bladder  Home Safety/Smoke Alarms: Feels safe at home. Lives with son. Smoke detector present.  No alarm system.  Firearm Safety:  No firearm   Librarian, academic:  Always wears seat belt   Counseling:   Eye Exam- 08/24/14 Dental- Not recently [facility]- pt reported done in 2010 or 2011 in [place], [place]   Shingles-Due Tetanus/tdap-Due   Objective:    Today's Vitals   12/28/14 0944  BP: 120/60  Pulse: 88  Temp: 98.1 F (36.7 C)  TempSrc: Oral  Height: 6' 1.5" (1.867 m)  Weight: 304 lb 9.6 oz (138.166 kg)  SpO2: 95%  PainSc: 0-No pain    Current Medications (verified) Outpatient Encounter Prescriptions as of 12/28/2014  Medication Sig  . Alcohol Swabs (B-D SINGLE USE SWABS REGULAR) PADS Use to check blood sugar once a day. Dx E11.91  . amitriptyline (ELAVIL) 25 MG tablet TAKE 2 TO 3 TABLETS AS DIRECTED AT BEDTIME. DUE FOR APPT WITH MELISSA [phone].  Marland Kitchen aspirin EC 81 MG tablet Take 81 mg by mouth daily.  Marland Kitchen atorvastatin (LIPITOR) 40 MG tablet Take 1 tablet (40 mg total) by mouth daily.  . betamethasone dipropionate (DIPROLENE) 0.05 % cream Apply topically 2 (two) times daily.  . colchicine 0.6 MG tablet IF GOUT FLARE TAKE 2 TABLETS FOLLOWED BY 1 TABLET 1 HOUR LATER. THEN RESUME ONE TABLET DAILY.  Marland Kitchen Ferrous Sulfate (IRON) 325 (65 FE) MG  TABS Take 1 tablet by mouth daily.  . fluconazole (DIFLUCAN) 150 MG tablet One tab by mouth today, repeat in 1 week.  . gabapentin (NEURONTIN) 800 MG tablet Take 1 tablet (800 mg total) by mouth 2 (two) times daily with a meal.  . hydrochlorothiazide (HYDRODIURIL) 25 MG tablet TAKE 1 TABLET  DAILY  . lidocaine (XYLOCAINE) 5 % ointment Apply 1 application topically daily as needed.  Marland Kitchen losartan (COZAAR) 25 MG tablet Take 1 tablet (25 mg total) by mouth daily.  . metFORMIN (GLUCOPHAGE) 1000 MG tablet Take 1 tablet (1,000 mg total) by mouth 2 (two) times daily with a meal.  . methadone (DOLOPHINE) 10 MG tablet Take 10 mg by mouth 3 (three) times daily.  . metoprolol succinate (TOPROL-XL) 25 MG 24 hr tablet Take 1 tablet (25 mg total) by mouth daily.  . mupirocin cream (BACTROBAN) 2 % Apply 1 application topically 2 (two) times daily.  . Oxycodone HCl 20 MG TABS Take 1 tablet (20 mg total) by mouth 4 (four) times daily as needed (for pain). Take 1/2 - 1 tablet (10-30m) by mouth 2-4 times daily as needed for pain  . pregabalin (LYRICA) 300 MG capsule Take 1 capsule (300 mg total) by mouth 2 (two) times daily.  . TRUEPLUS LANCETS 28G MISC Use to check blood sugar once a day. Dx E11.91  . vitamin B-12 (CYANOCOBALAMIN) 1000 MCG tablet Take 1,000 mcg by mouth daily.  .Marland Kitchen  Blood Glucose Calibration (TRUE METRIX LEVEL 1) LOW SOLN Use as directed. Dx E11.9 (Patient not taking: Reported on 12/28/2014)  . Blood Glucose Monitoring Suppl (TRUE METRIX METER) W/DEVICE KIT Use to check blood sugar once a day. Dx E11.91 (Patient not taking: Reported on 12/28/2014)  . glucose blood (TRUE METRIX BLOOD GLUCOSE TEST) test strip Use as instructed (Patient not taking: Reported on 12/28/2014)   No facility-administered encounter medications on file as of 12/28/2014.    Allergies (verified) Bactrim; Lisinopril; Percocet; and Pork-derived products   History: Past Medical History  Diagnosis Date  . Vein, varicose   .  Hyperlipidemia   . Varicose veins   . Complication of anesthesia     difficulty waking up  . Hypertension     Takes prinzide daily  . Diabetes mellitus     not consistent on taking medications  . Peripheral vascular disease   . Chronic kidney disease     acute renal failure in February admission  . Anemia     hx of blood transfusion  . Wound check, dressing change     dressing changes 3x/week; open wound  . Depression   . History of headache   . History of hepatitis C     competed treatment  . Neuromuscular disorder     gout and neuropathy  . History of blood clots 1986 or 87    blood clot in groin?  . Gout    Past Surgical History  Procedure Laterality Date  . Varicose vein surgery    . Hemorrhoid surgery    . Vascular surgery  1980s    left leg  . Femoral-popliteal bypass graft  11/24/2011    Procedure: BYPASS GRAFT FEMORAL-POPLITEAL ARTERY;  Surgeon: Rosetta Posner, MD;  Location: [facility];  Service: Vascular;  Laterality: Right;  . Abdominal aortagram N/A 11/17/2011    Procedure: ABDOMINAL AORTAGRAM;  Surgeon: Rosetta Posner, MD;  Location: [facility] CATH LAB;  Service: Cardiovascular;  Laterality: N/A;   Family History  Problem Relation Age of Onset  . Cancer Mother     liver?  Marland Kitchen Hyperlipidemia Father   . Hypertension Father   . Diabetes Father   . Alcohol abuse Father   . Arthritis Father   . Kidney disease Father   . Diabetes Son    Social History   Occupational History  . Not on file.   Social History Main Topics  . Smoking status: Current Every Day Smoker -- 0.25 packs/day for 35 years    Types: Cigars, Cigarettes    Last Attempt to Quit: 11/12/2011  . Smokeless tobacco: Never Used     Comment: 3 cigars a day  . Alcohol Use: No  . Drug Use: No  . Sexual Activity: Not on file   Tobacco Counseling Ready to quit: Yes Counseling given: Yes   Activities of Daily Living In your present state of health, do you have any difficulty performing the following  activities: 12/28/2014 11/30/2014  Hearing? N N  Vision? N N  Difficulty concentrating or making decisions? Y N  Walking or climbing stairs? Y N  Dressing or bathing? N N  Doing errands, shopping? N N    Immunizations and Health Maintenance Immunization History  Administered Date(s) Administered  . Influenza Split 02/20/2012  . Influenza,inj,Quad PF,36+ Mos 03/31/2013, 02/03/2014  . Pneumococcal Polysaccharide-23 03/31/2013   Health Maintenance Due  Topic Date Due  . URINE MICROALBUMIN  08/12/2014    Patient Care Team: Debbrah Alar, NP  as PCP - General (Internal Medicine) Carlena Bjornstad, MD as Physician Assistant Rosetta Posner, MD as Consulting Physician (Vascular Surgery)  Indicate any recent Medical Services you may have received from other than [facility] providers in the past year (date may be approximate).    Assessment:   This is a routine wellness visit for [employer].   Hearing/Vision screen  Hearing Screening   _0  _1  _2  _3  _4  _5  _6   Right ear:     100    Left ear:    100       Dietary issues and exercise activities discussed: Diet (meal preparation, eat out, water intake, caffeinated beverages, dairy products, fruits and vegetables):  He prepares meals.  Regular diet. Carbs and fried foods.  No pork or beef.    Physical Activity/Exercise:  Sedentary  Goals    . Increase physical activity     States he would like to increase physical activity by walking and riding a bike around neighborhood.      . Quit smoking / using tobacco     Working on cutting back.       Depression Screen PHQ 2/9 Scores 12/28/2014  PHQ - 2 Score 0    Fall Risk Fall Risk  12/28/2014  Falls in the past year? Yes  Number falls in past yr: 2 or more  Injury with Fall? No  Risk Factor Category  High Fall Risk  Risk for fall due to : Other (Comment)  Risk for fall due to (comments): Falls have been him falling out of bed.    Follow up Education provided     Cognitive Function: Alert and Orient.  Appropriate speech.  AD8 score: 2/8 items marked as yes.    Screening Tests Health Maintenance  Topic Date Due  . URINE MICROALBUMIN  08/12/2014  . ZOSTAVAX  02/18/2015 (Originally 08/14/2011)  . TETANUS/TDAP  02/18/2015 (Originally 08/14/1970)  . HIV Screening  02/18/2015 (Originally 08/14/1966)  . INFLUENZA VACCINE  01/04/2015  . HEMOGLOBIN A1C  06/01/2015  . OPHTHALMOLOGY EXAM  08/24/2015  . FOOT EXAM  12/28/2015  . PNEUMOCOCCAL POLYSACCHARIDE VACCINE (2) 03/31/2018  . COLONOSCOPY  06/05/2018        Plan:  Call insurance company and ask about coverage for Zostervax and Tetanus/Tdap.   Follow up with Debbrah Alar as needed.      During the course of the visit Jamari was educated and counseled about the following appropriate screening and preventive services:   Vaccines to include Pneumoccal, Influenza, Hepatitis B, Td, Zostavax, HCV  Electrocardiogram  Colorectal cancer screening  Cardiovascular disease screening  Diabetes screening  Glaucoma screening  Nutrition counseling  Prostate cancer screening  Smoking cessation counseling  Patient Instructions (the written plan) were given to the patient.   Rudene Anda, RN   12/28/2014

## 2014-12-30 DIAGNOSIS — E11622 Type 2 diabetes mellitus with other skin ulcer: Secondary | ICD-10-CM | POA: Diagnosis not present

## 2014-12-30 LAB — GLUCOSE, CAPILLARY: Glucose-Capillary: 193 mg/dL — ABNORMAL HIGH (ref 65–99)

## 2014-12-31 ENCOUNTER — Other Ambulatory Visit: Payer: Medicare HMO

## 2015-01-01 ENCOUNTER — Telehealth: Payer: Self-pay | Admitting: Family

## 2015-01-01 MED ORDER — PREGABALIN 300 MG PO CAPS: 300.0000 mg | ORAL_CAPSULE | Freq: Two times a day (BID) | ORAL | Status: DC

## 2015-01-01 NOTE — Telephone Encounter (Signed)
Caller name: [facility]   Call back number: [phone]    Reason for call:  Pharmacy in need of dosage clarification regarding pregabalin (LYRICA) 300 MG capsule

## 2015-01-01 NOTE — Telephone Encounter (Signed)
Spoke with pharmacist, Dorian Pod, at [facility] and verified Supervising MD DEA as well as prescribing NP DEA. Verified Lyrica  twice a day, #180 x no refills.

## 2015-01-06 ENCOUNTER — Encounter (HOSPITAL_BASED_OUTPATIENT_CLINIC_OR_DEPARTMENT_OTHER): Payer: Commercial Managed Care - HMO | Attending: Surgery

## 2015-01-06 DIAGNOSIS — E1140 Type 2 diabetes mellitus with diabetic neuropathy, unspecified: Secondary | ICD-10-CM | POA: Insufficient documentation

## 2015-01-06 DIAGNOSIS — B1920 Unspecified viral hepatitis C without hepatic coma: Secondary | ICD-10-CM | POA: Diagnosis not present

## 2015-01-06 DIAGNOSIS — E11622 Type 2 diabetes mellitus with other skin ulcer: Secondary | ICD-10-CM | POA: Diagnosis not present

## 2015-01-06 DIAGNOSIS — I872 Venous insufficiency (chronic) (peripheral): Secondary | ICD-10-CM | POA: Insufficient documentation

## 2015-01-06 DIAGNOSIS — E1151 Type 2 diabetes mellitus with diabetic peripheral angiopathy without gangrene: Secondary | ICD-10-CM | POA: Diagnosis not present

## 2015-01-06 DIAGNOSIS — L97821 Non-pressure chronic ulcer of other part of left lower leg limited to breakdown of skin: Secondary | ICD-10-CM | POA: Insufficient documentation

## 2015-01-13 ENCOUNTER — Telehealth: Payer: Self-pay | Admitting: Family

## 2015-01-13 DIAGNOSIS — I872 Venous insufficiency (chronic) (peripheral): Secondary | ICD-10-CM | POA: Diagnosis not present

## 2015-01-13 DIAGNOSIS — I87332 Chronic venous hypertension (idiopathic) with ulcer and inflammation of left lower extremity: Secondary | ICD-10-CM | POA: Diagnosis not present

## 2015-01-13 DIAGNOSIS — E1151 Type 2 diabetes mellitus with diabetic peripheral angiopathy without gangrene: Secondary | ICD-10-CM | POA: Diagnosis not present

## 2015-01-13 DIAGNOSIS — B1920 Unspecified viral hepatitis C without hepatic coma: Secondary | ICD-10-CM | POA: Diagnosis not present

## 2015-01-13 DIAGNOSIS — L97821 Non-pressure chronic ulcer of other part of left lower leg limited to breakdown of skin: Secondary | ICD-10-CM | POA: Diagnosis not present

## 2015-01-13 DIAGNOSIS — E11622 Type 2 diabetes mellitus with other skin ulcer: Secondary | ICD-10-CM | POA: Diagnosis not present

## 2015-01-13 DIAGNOSIS — L97421 Non-pressure chronic ulcer of left heel and midfoot limited to breakdown of skin: Secondary | ICD-10-CM | POA: Diagnosis not present

## 2015-01-13 DIAGNOSIS — E1140 Type 2 diabetes mellitus with diabetic neuropathy, unspecified: Secondary | ICD-10-CM | POA: Diagnosis not present

## 2015-01-13 NOTE — Telephone Encounter (Signed)
Spoke with pt. Has been seeing wound care center for 4-5 months for wound on lower left calf muscle. States that he had swelling prior to wound. Denies redness or warm to touch. Pt states he asked the wound care doctor at his last visit to prescribe a fluid pill and they told him to contact his PCP.  Appointment scheduled for Monday at 6:15pm with PCP.

## 2015-01-13 NOTE — Telephone Encounter (Signed)
Relation to UJ:WJXB Call back number:[phone]   Reason for call:  Patient requesting a water pill due to retaining water in his legs due to patient "wound" please advise

## 2015-01-13 NOTE — Telephone Encounter (Signed)
Noted  

## 2015-01-18 ENCOUNTER — Telehealth: Payer: Self-pay | Admitting: Family

## 2015-01-18 ENCOUNTER — Ambulatory Visit: Payer: Medicare HMO | Admitting: Family

## 2015-01-20 ENCOUNTER — Other Ambulatory Visit: Payer: Self-pay | Admitting: Family

## 2015-01-20 DIAGNOSIS — E11622 Type 2 diabetes mellitus with other skin ulcer: Secondary | ICD-10-CM | POA: Diagnosis not present

## 2015-01-20 DIAGNOSIS — L97821 Non-pressure chronic ulcer of other part of left lower leg limited to breakdown of skin: Secondary | ICD-10-CM | POA: Diagnosis not present

## 2015-01-20 DIAGNOSIS — B1920 Unspecified viral hepatitis C without hepatic coma: Secondary | ICD-10-CM | POA: Diagnosis not present

## 2015-01-20 DIAGNOSIS — E1151 Type 2 diabetes mellitus with diabetic peripheral angiopathy without gangrene: Secondary | ICD-10-CM | POA: Diagnosis not present

## 2015-01-20 DIAGNOSIS — E1140 Type 2 diabetes mellitus with diabetic neuropathy, unspecified: Secondary | ICD-10-CM | POA: Diagnosis not present

## 2015-01-20 DIAGNOSIS — I872 Venous insufficiency (chronic) (peripheral): Secondary | ICD-10-CM | POA: Diagnosis not present

## 2015-01-22 NOTE — Telephone Encounter (Signed)
Pt was no show 01/18/15 6:15pm, acute appt, pt has not rescheduled, appt made 01/13/15, charge for no show?

## 2015-01-22 NOTE — Telephone Encounter (Signed)
Yes please

## 2015-01-25 ENCOUNTER — Telehealth: Payer: Self-pay | Admitting: *Deleted

## 2015-01-25 NOTE — Telephone Encounter (Signed)
Prior auth for betamethasone cream initiated. Awaiting determination. JG//CMA

## 2015-01-27 DIAGNOSIS — L97321 Non-pressure chronic ulcer of left ankle limited to breakdown of skin: Secondary | ICD-10-CM | POA: Diagnosis not present

## 2015-01-27 DIAGNOSIS — E11622 Type 2 diabetes mellitus with other skin ulcer: Secondary | ICD-10-CM | POA: Diagnosis not present

## 2015-01-27 DIAGNOSIS — L97821 Non-pressure chronic ulcer of other part of left lower leg limited to breakdown of skin: Secondary | ICD-10-CM | POA: Diagnosis not present

## 2015-01-27 DIAGNOSIS — E1151 Type 2 diabetes mellitus with diabetic peripheral angiopathy without gangrene: Secondary | ICD-10-CM | POA: Diagnosis not present

## 2015-01-27 DIAGNOSIS — I872 Venous insufficiency (chronic) (peripheral): Secondary | ICD-10-CM | POA: Diagnosis not present

## 2015-01-27 DIAGNOSIS — I87332 Chronic venous hypertension (idiopathic) with ulcer and inflammation of left lower extremity: Secondary | ICD-10-CM | POA: Diagnosis not present

## 2015-01-27 DIAGNOSIS — B1920 Unspecified viral hepatitis C without hepatic coma: Secondary | ICD-10-CM | POA: Diagnosis not present

## 2015-01-27 DIAGNOSIS — E1140 Type 2 diabetes mellitus with diabetic neuropathy, unspecified: Secondary | ICD-10-CM | POA: Diagnosis not present

## 2015-01-27 DIAGNOSIS — I70242 Atherosclerosis of native arteries of left leg with ulceration of calf: Secondary | ICD-10-CM | POA: Diagnosis not present

## 2015-02-02 DIAGNOSIS — E1142 Type 2 diabetes mellitus with diabetic polyneuropathy: Secondary | ICD-10-CM | POA: Diagnosis not present

## 2015-02-02 DIAGNOSIS — M25579 Pain in unspecified ankle and joints of unspecified foot: Secondary | ICD-10-CM | POA: Diagnosis not present

## 2015-02-02 DIAGNOSIS — R262 Difficulty in walking, not elsewhere classified: Secondary | ICD-10-CM | POA: Diagnosis not present

## 2015-02-02 DIAGNOSIS — M1A9XX0 Chronic gout, unspecified, without tophus (tophi): Secondary | ICD-10-CM | POA: Diagnosis not present

## 2015-02-03 DIAGNOSIS — E11622 Type 2 diabetes mellitus with other skin ulcer: Secondary | ICD-10-CM | POA: Diagnosis not present

## 2015-02-03 DIAGNOSIS — L97221 Non-pressure chronic ulcer of left calf limited to breakdown of skin: Secondary | ICD-10-CM | POA: Diagnosis not present

## 2015-02-03 DIAGNOSIS — B1920 Unspecified viral hepatitis C without hepatic coma: Secondary | ICD-10-CM | POA: Diagnosis not present

## 2015-02-03 DIAGNOSIS — E1140 Type 2 diabetes mellitus with diabetic neuropathy, unspecified: Secondary | ICD-10-CM | POA: Diagnosis not present

## 2015-02-03 DIAGNOSIS — L97321 Non-pressure chronic ulcer of left ankle limited to breakdown of skin: Secondary | ICD-10-CM | POA: Diagnosis not present

## 2015-02-03 DIAGNOSIS — E1151 Type 2 diabetes mellitus with diabetic peripheral angiopathy without gangrene: Secondary | ICD-10-CM | POA: Diagnosis not present

## 2015-02-03 DIAGNOSIS — I872 Venous insufficiency (chronic) (peripheral): Secondary | ICD-10-CM | POA: Diagnosis not present

## 2015-02-03 DIAGNOSIS — L97821 Non-pressure chronic ulcer of other part of left lower leg limited to breakdown of skin: Secondary | ICD-10-CM | POA: Diagnosis not present

## 2015-02-10 ENCOUNTER — Encounter (HOSPITAL_BASED_OUTPATIENT_CLINIC_OR_DEPARTMENT_OTHER): Payer: Medicare Other | Attending: Surgery

## 2015-02-10 DIAGNOSIS — I739 Peripheral vascular disease, unspecified: Secondary | ICD-10-CM | POA: Diagnosis not present

## 2015-02-10 DIAGNOSIS — L97321 Non-pressure chronic ulcer of left ankle limited to breakdown of skin: Secondary | ICD-10-CM | POA: Diagnosis not present

## 2015-02-10 DIAGNOSIS — F17200 Nicotine dependence, unspecified, uncomplicated: Secondary | ICD-10-CM | POA: Diagnosis not present

## 2015-02-10 DIAGNOSIS — I70242 Atherosclerosis of native arteries of left leg with ulceration of calf: Secondary | ICD-10-CM | POA: Diagnosis not present

## 2015-02-10 DIAGNOSIS — E11622 Type 2 diabetes mellitus with other skin ulcer: Secondary | ICD-10-CM | POA: Diagnosis not present

## 2015-02-10 DIAGNOSIS — B1920 Unspecified viral hepatitis C without hepatic coma: Secondary | ICD-10-CM | POA: Insufficient documentation

## 2015-02-10 DIAGNOSIS — E1140 Type 2 diabetes mellitus with diabetic neuropathy, unspecified: Secondary | ICD-10-CM | POA: Insufficient documentation

## 2015-02-10 DIAGNOSIS — L97221 Non-pressure chronic ulcer of left calf limited to breakdown of skin: Secondary | ICD-10-CM | POA: Diagnosis not present

## 2015-02-10 DIAGNOSIS — I87332 Chronic venous hypertension (idiopathic) with ulcer and inflammation of left lower extremity: Secondary | ICD-10-CM | POA: Diagnosis not present

## 2015-02-10 LAB — GLUCOSE, CAPILLARY: GLUCOSE-CAPILLARY: 91 mg/dL (ref 65–99)

## 2015-02-17 DIAGNOSIS — L97221 Non-pressure chronic ulcer of left calf limited to breakdown of skin: Secondary | ICD-10-CM | POA: Diagnosis not present

## 2015-02-17 DIAGNOSIS — L97321 Non-pressure chronic ulcer of left ankle limited to breakdown of skin: Secondary | ICD-10-CM | POA: Diagnosis not present

## 2015-02-17 DIAGNOSIS — I739 Peripheral vascular disease, unspecified: Secondary | ICD-10-CM | POA: Diagnosis not present

## 2015-02-17 DIAGNOSIS — I87332 Chronic venous hypertension (idiopathic) with ulcer and inflammation of left lower extremity: Secondary | ICD-10-CM | POA: Diagnosis not present

## 2015-02-17 DIAGNOSIS — E11622 Type 2 diabetes mellitus with other skin ulcer: Secondary | ICD-10-CM | POA: Diagnosis not present

## 2015-02-17 DIAGNOSIS — F17200 Nicotine dependence, unspecified, uncomplicated: Secondary | ICD-10-CM | POA: Diagnosis not present

## 2015-02-17 DIAGNOSIS — E1140 Type 2 diabetes mellitus with diabetic neuropathy, unspecified: Secondary | ICD-10-CM | POA: Diagnosis not present

## 2015-02-17 DIAGNOSIS — B1920 Unspecified viral hepatitis C without hepatic coma: Secondary | ICD-10-CM | POA: Diagnosis not present

## 2015-02-17 DIAGNOSIS — I70242 Atherosclerosis of native arteries of left leg with ulceration of calf: Secondary | ICD-10-CM | POA: Diagnosis not present

## 2015-02-18 ENCOUNTER — Telehealth: Payer: Self-pay | Admitting: Family

## 2015-02-18 NOTE — Telephone Encounter (Signed)
Pt called about PA but I wasn't able to get details as his phone dropped the call.

## 2015-02-18 NOTE — Telephone Encounter (Signed)
Pt called back stating that he's had several insurance denials for neuro appts with Dr. Kerney Elbe. Pt gave me many dates and was confused about DOS vs bill date. Asked pt to bring in so we can copy and determine if we can get PA. Per notes on referral pt was notified that Dr. Kerney Elbe was out of network.

## 2015-02-24 DIAGNOSIS — B1920 Unspecified viral hepatitis C without hepatic coma: Secondary | ICD-10-CM | POA: Diagnosis not present

## 2015-02-24 DIAGNOSIS — E11622 Type 2 diabetes mellitus with other skin ulcer: Secondary | ICD-10-CM | POA: Diagnosis not present

## 2015-02-24 DIAGNOSIS — F17200 Nicotine dependence, unspecified, uncomplicated: Secondary | ICD-10-CM | POA: Diagnosis not present

## 2015-02-24 DIAGNOSIS — L97321 Non-pressure chronic ulcer of left ankle limited to breakdown of skin: Secondary | ICD-10-CM | POA: Diagnosis not present

## 2015-02-24 DIAGNOSIS — I87332 Chronic venous hypertension (idiopathic) with ulcer and inflammation of left lower extremity: Secondary | ICD-10-CM | POA: Diagnosis not present

## 2015-02-24 DIAGNOSIS — E1140 Type 2 diabetes mellitus with diabetic neuropathy, unspecified: Secondary | ICD-10-CM | POA: Diagnosis not present

## 2015-02-24 DIAGNOSIS — L97821 Non-pressure chronic ulcer of other part of left lower leg limited to breakdown of skin: Secondary | ICD-10-CM | POA: Diagnosis not present

## 2015-02-24 DIAGNOSIS — I739 Peripheral vascular disease, unspecified: Secondary | ICD-10-CM | POA: Diagnosis not present

## 2015-02-25 ENCOUNTER — Telehealth: Payer: Self-pay | Admitting: Family

## 2015-02-25 MED ORDER — AMITRIPTYLINE HCL 25 MG PO TABS: ORAL_TABLET | ORAL | Status: DC

## 2015-02-25 NOTE — Telephone Encounter (Signed)
Rx for 30 tablets sent to pharmacy.

## 2015-02-25 NOTE — Telephone Encounter (Signed)
[facility] [address] [facility]  Reason for call: Pt calling for amitriptyline (ELAVIL) 25 MG tablet. Pt states he's been out for a couple of days. Scheduled f/u appt 03/01/15.

## 2015-03-01 ENCOUNTER — Telehealth: Payer: Self-pay | Admitting: Family

## 2015-03-01 ENCOUNTER — Encounter: Payer: Self-pay | Admitting: Family

## 2015-03-01 ENCOUNTER — Ambulatory Visit (INDEPENDENT_AMBULATORY_CARE_PROVIDER_SITE_OTHER): Payer: Medicare Other | Admitting: Family

## 2015-03-01 VITALS — BP 143/76 | HR 86 | Temp 98.0°F | Resp 16 | Ht 73.0 in | Wt 310.8 lb

## 2015-03-01 DIAGNOSIS — R635 Abnormal weight gain: Secondary | ICD-10-CM | POA: Diagnosis not present

## 2015-03-01 DIAGNOSIS — I10 Essential (primary) hypertension: Secondary | ICD-10-CM

## 2015-03-01 DIAGNOSIS — E785 Hyperlipidemia, unspecified: Secondary | ICD-10-CM

## 2015-03-01 DIAGNOSIS — E119 Type 2 diabetes mellitus without complications: Secondary | ICD-10-CM | POA: Diagnosis not present

## 2015-03-01 DIAGNOSIS — M109 Gout, unspecified: Secondary | ICD-10-CM

## 2015-03-01 DIAGNOSIS — E1169 Type 2 diabetes mellitus with other specified complication: Secondary | ICD-10-CM

## 2015-03-01 DIAGNOSIS — R21 Rash and other nonspecific skin eruption: Secondary | ICD-10-CM

## 2015-03-01 DIAGNOSIS — L97529 Non-pressure chronic ulcer of other part of left foot with unspecified severity: Secondary | ICD-10-CM

## 2015-03-01 LAB — BASIC METABOLIC PANEL
BUN: 13 mg/dL (ref 6–23)
CALCIUM: 9.2 mg/dL (ref 8.4–10.5)
CHLORIDE: 106 meq/L (ref 96–112)
CO2: 26 meq/L (ref 19–32)
CREATININE: 1.08 mg/dL (ref 0.40–1.50)
GFR: 88.65 mL/min (ref >60.00)
Glucose, Bld: 140 mg/dL — ABNORMAL HIGH (ref 70–99)
Potassium: 4.5 mEq/L (ref 3.5–5.1)
Sodium: 140 mEq/L (ref 135–145)

## 2015-03-01 LAB — TSH: TSH: 1.53 u[IU]/mL (ref 0.35–4.50)

## 2015-03-01 LAB — LIPID PANEL
CHOL/HDL RATIO: 5
Cholesterol: 131 mg/dL (ref 0–200)
HDL: 25.1 mg/dL — ABNORMAL LOW (ref >39.00)
LDL CALC: 74 mg/dL (ref 0–99)
NONHDL: 105.66
TRIGLYCERIDES: 160 mg/dL — AB (ref 0.0–149.0)
VLDL: 32 mg/dL (ref 0.0–40.0)

## 2015-03-01 LAB — MICROALBUMIN / CREATININE URINE RATIO
Creatinine,U: 127.3 mg/dL
MICROALB/CREAT RATIO: 1.8 mg/g (ref 0.0–30.0)
Microalb, Ur: 2.3 mg/dL — ABNORMAL HIGH (ref 0.0–1.9)

## 2015-03-01 LAB — HEMOGLOBIN A1C: Hgb A1c MFr Bld: 7.4 % — ABNORMAL HIGH (ref 4.6–6.5)

## 2015-03-01 LAB — URIC ACID: URIC ACID, SERUM: 11 mg/dL — AB (ref 4.0–7.8)

## 2015-03-01 MED ORDER — ALLOPURINOL 100 MG PO TABS: 100.0000 mg | ORAL_TABLET | Freq: Every day | ORAL | Status: DC

## 2015-03-01 MED ORDER — SITAGLIPTIN PHOSPHATE 100 MG PO TABS: 100.0000 mg | ORAL_TABLET | Freq: Every day | ORAL | Status: DC

## 2015-03-01 NOTE — Assessment & Plan Note (Signed)
Uncontrolled. rx with colchicine, obtain uric acid level. May need to add allopurinol- await lab results.

## 2015-03-01 NOTE — Patient Instructions (Addendum)
Please complete lab work prior to leaving. Start colchicine 2 tabs now, 1 tab in 1 hour. May repeat tomorrow if gout symptoms are not resolved. Stop metformin, start Januvia.

## 2015-03-01 NOTE — Assessment & Plan Note (Signed)
Improving, management per wound clinic.

## 2015-03-01 NOTE — Telephone Encounter (Signed)
Notified pt and he voices understanding. States he has enough colchicine on hand to take daily for the next 30 days. [facility]  3 month f/u scheduled for 06/04/15 at 10:30am.

## 2015-03-01 NOTE — Progress Notes (Signed)
Pre visit review using our clinic review tool, if applicable. No additional management support is needed unless otherwise documented below in the visit note. 

## 2015-03-01 NOTE — Assessment & Plan Note (Signed)
BP is stable. Continue current meds.  °

## 2015-03-01 NOTE — Progress Notes (Signed)
Subjective:    Patient ID: [name] [name], male    DOB: 1950-07-15, 64 y.o.   MRN: [id number]  HPI  Mr. Stefanski is a 64 yr old male who presents today for follow up.  1) DM2- Patient believes that the metformin is causing him to break out in a blistering rash. Lab Results  Component Value Date   HGBA1C 7.0* 11/30/2014   HGBA1C 7.4* 08/27/2014   HGBA1C 8.0 Repeated and verified X2.* 02/03/2014   Lab Results  Component Value Date   MICROALBUR 3.43* 08/11/2013   LDLCALC 109* 08/11/2013   CREATININE 1.03 11/30/2014   2) HTN- Pt is maintained on losartan and toprol xl.   BP Readings from Last 3 Encounters:  03/01/15 143/76  12/28/14 120/60  11/30/14 140/86   3) Gout- + gout flare reported in his left great toe.   4) L foot wound- the patient is s/p skin grafting and reports that the wound is healing well. He is followed by the wound care clinic for ongoing management and dressing changes.  Review of Systems See HPI  Past Medical History  Diagnosis Date  . Vein, varicose   . Hyperlipidemia   . Varicose veins   . Complication of anesthesia     difficulty waking up  . Hypertension     Takes prinzide daily  . Diabetes mellitus     not consistent on taking medications  . Peripheral vascular disease   . Chronic kidney disease     acute renal failure in February admission  . Anemia     hx of blood transfusion  . Wound check, dressing change     dressing changes 3x/week; open wound  . Depression   . History of headache   . History of hepatitis C     competed treatment  . Neuromuscular disorder     gout and neuropathy  . History of blood clots 1986 or 87    blood clot in groin?  . Gout     Social History   Social History  . Marital Status: Single    Spouse Name: N/A  . Number of Children: 5  . Years of Education: N/A   Occupational History  . Not on file.   Social History Main Topics  . Smoking status: Current Every Day Smoker -- 0.25 packs/day for 35  years    Types: Cigars, Cigarettes    Last Attempt to Quit: 11/12/2011  . Smokeless tobacco: Never Used     Comment: 3 cigars a day  . Alcohol Use: No  . Drug Use: No  . Sexual Activity: Not on file   Other Topics Concern  . Not on file   Social History Narrative   Regular exercise:  Walks a little every day.   Lives with 28 yr old son.   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   Quit smoking few months ago.   Goes to [employer]- drug free since 1994.   Former Etoh abuse.    Past Surgical History  Procedure Laterality Date  . Varicose vein surgery    . Hemorrhoid surgery    . Vascular surgery  1980s    left leg  . Femoral-popliteal bypass graft  11/24/2011    Procedure: BYPASS GRAFT FEMORAL-POPLITEAL ARTERY;  Surgeon: Larina Earthly, MD;  Location: [name] OR;  Service: Vascular;  Laterality: Right;  . Abdominal aortagram N/A 11/17/2011    Procedure: ABDOMINAL AORTAGRAM;  Surgeon: Larina Earthly,  MD;  Location: [facility] CATH LAB;  Service: Cardiovascular;  Laterality: N/A;    Family History  Problem Relation Age of Onset  . Cancer Mother     liver?  Marland Kitchen Hyperlipidemia Father   . Hypertension Father   . Diabetes Father   . Alcohol abuse Father   . Arthritis Father   . Kidney disease Father   . Diabetes Son     Allergies  Allergen Reactions  . Bactrim [Sulfamethoxazole-Trimethoprim] Other (See Comments)    Blisters  . Lisinopril     Hyperkalemia   . Percocet [Oxycodone-Acetaminophen] Nausea And Vomiting  . Pork-Derived Products     Current Outpatient Prescriptions on File Prior to Visit  Medication Sig Dispense Refill  . Alcohol Swabs (B-D SINGLE USE SWABS REGULAR) PADS Use to check blood sugar once a day. Dx E11.91 100 each 1  . amitriptyline (ELAVIL) 25 MG tablet TAKE 2 TO 3 TABLETS AS DIRECTED AT BEDTIME.  Please keep appt as scheduled. 30 tablet 0  . aspirin EC 81 MG tablet Take 81 mg by mouth daily.    Marland Kitchen atorvastatin (LIPITOR) 40 MG  tablet Take 1 tablet (40 mg total) by mouth daily. 30 tablet 3  . betamethasone dipropionate (DIPROLENE) 0.05 % cream APPLY TOPICALLY 2 TIMES DAILY 30 g 0  . Blood Glucose Calibration (TRUE METRIX LEVEL 1) LOW SOLN Use as directed. Dx E11.9 3 each 1  . Blood Glucose Monitoring Suppl (TRUE METRIX METER) W/DEVICE KIT Use to check blood sugar once a day. Dx E11.91 1 kit 0  . colchicine 0.6 MG tablet IF GOUT FLARE TAKE 2 TABLETS FOLLOWED BY 1 TABLET 1 HOUR LATER. THEN RESUME ONE TABLET DAILY. 120 tablet 1  . Ferrous Sulfate (IRON) 325 (65 FE) MG TABS Take 1 tablet by mouth daily. 30 each 2  . fluconazole (DIFLUCAN) 150 MG tablet One tab by mouth today, repeat in 1 week. 2 tablet 0  . gabapentin (NEURONTIN) 800 MG tablet Take 1 tablet (800 mg total) by mouth 2 (two) times daily with a meal. 180 tablet 1  . glucose blood (TRUE METRIX BLOOD GLUCOSE TEST) test strip Use as instructed 100 each 12  . hydrochlorothiazide (HYDRODIURIL) 25 MG tablet TAKE 1 TABLET  DAILY 90 tablet 1  . lidocaine (XYLOCAINE) 5 % ointment Apply 1 application topically daily as needed. 50 g 1  . losartan (COZAAR) 25 MG tablet Take 1 tablet (25 mg total) by mouth daily. 90 tablet 1  . metFORMIN (GLUCOPHAGE) 1000 MG tablet Take 1 tablet (1,000 mg total) by mouth 2 (two) times daily with a meal. 180 tablet 1  . methadone (DOLOPHINE) 10 MG tablet Take 10 mg by mouth 3 (three) times daily.    . metoprolol succinate (TOPROL-XL) 25 MG 24 hr tablet Take 1 tablet (25 mg total) by mouth daily. 90 tablet 1  . mupirocin cream (BACTROBAN) 2 % Apply 1 application topically 2 (two) times daily. 45 g 1  . Oxycodone HCl 20 MG TABS Take 1 tablet (20 mg total) by mouth 4 (four) times daily as needed (for pain). Take 1/2 - 1 tablet (10-$RemoveBefo'20mg'EPfDVZfSmak$ ) by mouth 2-4 times daily as needed for pain 30 tablet 0  . pregabalin (LYRICA) 300 MG capsule Take 1 capsule (300 mg total) by mouth 2 (two) times daily. 180 capsule 0  . TRUEPLUS LANCETS 28G MISC Use to check  blood sugar once a day. Dx E11.91 100 each 1  . vitamin B-12 (CYANOCOBALAMIN) 1000 MCG tablet Take 1,000  mcg by mouth daily.     No current facility-administered medications on file prior to visit.    BP 143/76 mmHg  Pulse 86  Temp(Src) 98 F (36.7 C) (Oral)  Resp 16  Ht $R'6\' 1"'JJ$  (1.854 m)  Wt 310 lb 12.8 oz (140.978 kg)  BMI 41.01 kg/m2  SpO2 94%       Objective:   Physical Exam  Constitutional: He is oriented to person, place, and time. He appears well-developed and well-nourished. No distress.  Tired appearing  HENT:  Head: Normocephalic and atraumatic.  Cardiovascular: Normal rate and regular rhythm.   No murmur heard. Pulmonary/Chest: Effort normal and breath sounds normal. No respiratory distress. He has no wheezes. He has no rales.  Musculoskeletal: He exhibits no edema.  Left foot is concealed by dressing.   Neurological: He is alert and oriented to person, place, and time.  Skin: Skin is warm and dry.  Eczematous rash noted on bilateral hands/forearms, small pustular lesions  Psychiatric: He has a normal mood and affect. His behavior is normal. Thought content normal.          Assessment & Plan:  Skin rash- pt believes that metformin is causing rash. Will d/c metformin, start januvia.  He has gained a significant amount of weight. He attributes this to steroid rx, however he is no longer on steroids. Will obtain TSH to rule out hypothyroid.  Lyrica could be another contributor to his weight gain.  Wt Readings from Last 3 Encounters:  03/01/15 310 lb 12.8 oz (140.978 kg)  12/28/14 304 lb 9.6 oz (138.166 kg)  11/30/14 292 lb (132.45 kg)

## 2015-03-01 NOTE — Telephone Encounter (Signed)
Uric acid level is very high and likely cause for gout flare. Advise pt to start allopurinol once daily. Continue colchicine once daily for 1 month as well, then stop colchicine but continue allopurinol.  Follow up in 3 months.

## 2015-03-01 NOTE — Assessment & Plan Note (Signed)
Fair control. Not tolerating metformin, d/c metformin, trial of januvia.

## 2015-03-02 DIAGNOSIS — R262 Difficulty in walking, not elsewhere classified: Secondary | ICD-10-CM | POA: Diagnosis not present

## 2015-03-02 DIAGNOSIS — E1142 Type 2 diabetes mellitus with diabetic polyneuropathy: Secondary | ICD-10-CM | POA: Diagnosis not present

## 2015-03-02 DIAGNOSIS — M1A9XX0 Chronic gout, unspecified, without tophus (tophi): Secondary | ICD-10-CM | POA: Diagnosis not present

## 2015-03-02 DIAGNOSIS — M25579 Pain in unspecified ankle and joints of unspecified foot: Secondary | ICD-10-CM | POA: Diagnosis not present

## 2015-03-03 DIAGNOSIS — L97821 Non-pressure chronic ulcer of other part of left lower leg limited to breakdown of skin: Secondary | ICD-10-CM | POA: Diagnosis not present

## 2015-03-03 DIAGNOSIS — E1140 Type 2 diabetes mellitus with diabetic neuropathy, unspecified: Secondary | ICD-10-CM | POA: Diagnosis not present

## 2015-03-03 DIAGNOSIS — I70242 Atherosclerosis of native arteries of left leg with ulceration of calf: Secondary | ICD-10-CM | POA: Diagnosis not present

## 2015-03-03 DIAGNOSIS — I739 Peripheral vascular disease, unspecified: Secondary | ICD-10-CM | POA: Diagnosis not present

## 2015-03-03 DIAGNOSIS — B1920 Unspecified viral hepatitis C without hepatic coma: Secondary | ICD-10-CM | POA: Diagnosis not present

## 2015-03-03 DIAGNOSIS — E11622 Type 2 diabetes mellitus with other skin ulcer: Secondary | ICD-10-CM | POA: Diagnosis not present

## 2015-03-03 DIAGNOSIS — I87332 Chronic venous hypertension (idiopathic) with ulcer and inflammation of left lower extremity: Secondary | ICD-10-CM | POA: Diagnosis not present

## 2015-03-03 DIAGNOSIS — L97321 Non-pressure chronic ulcer of left ankle limited to breakdown of skin: Secondary | ICD-10-CM | POA: Diagnosis not present

## 2015-03-03 DIAGNOSIS — F17200 Nicotine dependence, unspecified, uncomplicated: Secondary | ICD-10-CM | POA: Diagnosis not present

## 2015-03-04 ENCOUNTER — Telehealth: Payer: Self-pay | Admitting: Family

## 2015-03-04 NOTE — Telephone Encounter (Signed)
error 

## 2015-03-04 NOTE — Telephone Encounter (Signed)
Relation to ZO:XWRU Call back number: [phone] [facility]: [id number] [place], [place] - [address] [facility] [address] [phone] (Phone) [phone] (Fax)         Reason for call:  Patient requesting a refill  amitriptyline (ELAVIL) 25 MG tablet  betamethasone dipropionate (DIPROLENE) 0.05 % cream  vitamin B-12 (CYANOCOBALAMIN) 1000 MCG tablet  hydrochlorothiazide (HYDRODIURIL) 25 MG tablet  fluconazole (DIFLUCAN) 150 MG tablet  losartan (COZAAR) 25 MG tablet  metoprolol succinate (TOPROL-XL) 25 MG 24 hr tablet  Oxycodone HCl 20 MG TABS  atorvastatin (LIPITOR) 40 MG tablet

## 2015-03-05 MED ORDER — AMITRIPTYLINE HCL 25 MG PO TABS: ORAL_TABLET | ORAL | Status: DC

## 2015-03-05 MED ORDER — ATORVASTATIN CALCIUM 40 MG PO TABS: 40.0000 mg | ORAL_TABLET | Freq: Every day | ORAL | Status: DC

## 2015-03-05 MED ORDER — METOPROLOL SUCCINATE ER 25 MG PO TB24: 25.0000 mg | ORAL_TABLET | Freq: Every day | ORAL | Status: DC

## 2015-03-05 MED ORDER — HYDROCHLOROTHIAZIDE 25 MG PO TABS: ORAL_TABLET | ORAL | Status: DC

## 2015-03-05 MED ORDER — BETAMETHASONE DIPROPIONATE 0.05 % EX CREA: TOPICAL_CREAM | CUTANEOUS | Status: DC

## 2015-03-05 MED ORDER — LOSARTAN POTASSIUM 25 MG PO TABS
25.0000 mg | ORAL_TABLET | Freq: Every day | ORAL | Status: DC
Start: 2015-03-05 — End: 2015-08-09

## 2015-03-05 NOTE — Telephone Encounter (Signed)
Pt called stating went to the pharmacy to get rx but it was not ready, pt states is needing his rx (all that are mentioned below). ASAP. Please advise.

## 2015-03-05 NOTE — Telephone Encounter (Signed)
Advised pt that vitamin b12 is over the counter, diflucan was a one time treatment and given 11/2014, oxycodone is not manage by Melissa.  Pt states he no longer uses mail order.  All other refills requested below has been sent to the pharmacy and pt has been notified.

## 2015-03-05 NOTE — Addendum Note (Signed)
Addended by: Mervin Kung A on: 03/05/2015 03:07 PM   Modules accepted: Orders, Medications

## 2015-03-10 ENCOUNTER — Encounter (HOSPITAL_BASED_OUTPATIENT_CLINIC_OR_DEPARTMENT_OTHER): Payer: Medicare Other | Attending: Surgery

## 2015-03-10 DIAGNOSIS — D649 Anemia, unspecified: Secondary | ICD-10-CM | POA: Insufficient documentation

## 2015-03-10 DIAGNOSIS — L259 Unspecified contact dermatitis, unspecified cause: Secondary | ICD-10-CM | POA: Diagnosis not present

## 2015-03-10 DIAGNOSIS — I87332 Chronic venous hypertension (idiopathic) with ulcer and inflammation of left lower extremity: Secondary | ICD-10-CM | POA: Diagnosis not present

## 2015-03-10 DIAGNOSIS — Z9181 History of falling: Secondary | ICD-10-CM | POA: Insufficient documentation

## 2015-03-10 DIAGNOSIS — I70242 Atherosclerosis of native arteries of left leg with ulceration of calf: Secondary | ICD-10-CM | POA: Diagnosis not present

## 2015-03-10 DIAGNOSIS — E11621 Type 2 diabetes mellitus with foot ulcer: Secondary | ICD-10-CM | POA: Diagnosis not present

## 2015-03-10 DIAGNOSIS — M109 Gout, unspecified: Secondary | ICD-10-CM | POA: Diagnosis not present

## 2015-03-10 DIAGNOSIS — L97321 Non-pressure chronic ulcer of left ankle limited to breakdown of skin: Secondary | ICD-10-CM | POA: Diagnosis not present

## 2015-03-10 DIAGNOSIS — E11622 Type 2 diabetes mellitus with other skin ulcer: Secondary | ICD-10-CM | POA: Insufficient documentation

## 2015-03-10 DIAGNOSIS — L97821 Non-pressure chronic ulcer of other part of left lower leg limited to breakdown of skin: Secondary | ICD-10-CM | POA: Insufficient documentation

## 2015-03-10 DIAGNOSIS — S90822A Blister (nonthermal), left foot, initial encounter: Secondary | ICD-10-CM | POA: Diagnosis not present

## 2015-03-17 DIAGNOSIS — M109 Gout, unspecified: Secondary | ICD-10-CM | POA: Diagnosis not present

## 2015-03-17 DIAGNOSIS — D649 Anemia, unspecified: Secondary | ICD-10-CM | POA: Diagnosis not present

## 2015-03-17 DIAGNOSIS — Z9181 History of falling: Secondary | ICD-10-CM | POA: Diagnosis not present

## 2015-03-17 DIAGNOSIS — I70242 Atherosclerosis of native arteries of left leg with ulceration of calf: Secondary | ICD-10-CM | POA: Diagnosis not present

## 2015-03-17 DIAGNOSIS — L97321 Non-pressure chronic ulcer of left ankle limited to breakdown of skin: Secondary | ICD-10-CM | POA: Diagnosis not present

## 2015-03-17 DIAGNOSIS — L97821 Non-pressure chronic ulcer of other part of left lower leg limited to breakdown of skin: Secondary | ICD-10-CM | POA: Diagnosis not present

## 2015-03-17 DIAGNOSIS — I87332 Chronic venous hypertension (idiopathic) with ulcer and inflammation of left lower extremity: Secondary | ICD-10-CM | POA: Diagnosis not present

## 2015-03-17 DIAGNOSIS — E11622 Type 2 diabetes mellitus with other skin ulcer: Secondary | ICD-10-CM | POA: Diagnosis not present

## 2015-03-24 ENCOUNTER — Emergency Department (HOSPITAL_COMMUNITY): Payer: Medicare Other

## 2015-03-24 ENCOUNTER — Emergency Department (HOSPITAL_COMMUNITY)
Admission: EM | Admit: 2015-03-24 | Discharge: 2015-03-24 | Disposition: A | Discharge status: Home / Self Care | Payer: Medicare Other | Attending: Emergency Medicine | Admitting: Emergency Medicine

## 2015-03-24 ENCOUNTER — Telehealth: Payer: Self-pay | Admitting: Family

## 2015-03-24 ENCOUNTER — Encounter (HOSPITAL_COMMUNITY): Payer: Self-pay | Admitting: *Deleted

## 2015-03-24 DIAGNOSIS — Z9181 History of falling: Secondary | ICD-10-CM | POA: Diagnosis not present

## 2015-03-24 DIAGNOSIS — Z720 Tobacco use: Secondary | ICD-10-CM | POA: Diagnosis not present

## 2015-03-24 DIAGNOSIS — F1210 Cannabis abuse, uncomplicated: Secondary | ICD-10-CM | POA: Insufficient documentation

## 2015-03-24 DIAGNOSIS — I87332 Chronic venous hypertension (idiopathic) with ulcer and inflammation of left lower extremity: Secondary | ICD-10-CM | POA: Diagnosis not present

## 2015-03-24 DIAGNOSIS — E663 Overweight: Secondary | ICD-10-CM | POA: Insufficient documentation

## 2015-03-24 DIAGNOSIS — Z8619 Personal history of other infectious and parasitic diseases: Secondary | ICD-10-CM | POA: Insufficient documentation

## 2015-03-24 DIAGNOSIS — R05 Cough: Secondary | ICD-10-CM | POA: Diagnosis not present

## 2015-03-24 DIAGNOSIS — Z7952 Long term (current) use of systemic steroids: Secondary | ICD-10-CM | POA: Insufficient documentation

## 2015-03-24 DIAGNOSIS — N189 Chronic kidney disease, unspecified: Secondary | ICD-10-CM | POA: Diagnosis not present

## 2015-03-24 DIAGNOSIS — E119 Type 2 diabetes mellitus without complications: Secondary | ICD-10-CM | POA: Insufficient documentation

## 2015-03-24 DIAGNOSIS — I129 Hypertensive chronic kidney disease with stage 1 through stage 4 chronic kidney disease, or unspecified chronic kidney disease: Secondary | ICD-10-CM | POA: Diagnosis not present

## 2015-03-24 DIAGNOSIS — Z7982 Long term (current) use of aspirin: Secondary | ICD-10-CM | POA: Insufficient documentation

## 2015-03-24 DIAGNOSIS — M109 Gout, unspecified: Secondary | ICD-10-CM | POA: Insufficient documentation

## 2015-03-24 DIAGNOSIS — L97821 Non-pressure chronic ulcer of other part of left lower leg limited to breakdown of skin: Secondary | ICD-10-CM | POA: Diagnosis not present

## 2015-03-24 DIAGNOSIS — Z79899 Other long term (current) drug therapy: Secondary | ICD-10-CM | POA: Insufficient documentation

## 2015-03-24 DIAGNOSIS — D649 Anemia, unspecified: Secondary | ICD-10-CM | POA: Diagnosis not present

## 2015-03-24 DIAGNOSIS — L97321 Non-pressure chronic ulcer of left ankle limited to breakdown of skin: Secondary | ICD-10-CM | POA: Diagnosis not present

## 2015-03-24 DIAGNOSIS — G629 Polyneuropathy, unspecified: Secondary | ICD-10-CM | POA: Insufficient documentation

## 2015-03-24 DIAGNOSIS — Z86718 Personal history of other venous thrombosis and embolism: Secondary | ICD-10-CM | POA: Insufficient documentation

## 2015-03-24 DIAGNOSIS — R5383 Other fatigue: Secondary | ICD-10-CM | POA: Insufficient documentation

## 2015-03-24 DIAGNOSIS — E11622 Type 2 diabetes mellitus with other skin ulcer: Secondary | ICD-10-CM | POA: Diagnosis not present

## 2015-03-24 DIAGNOSIS — S0990XA Unspecified injury of head, initial encounter: Secondary | ICD-10-CM | POA: Diagnosis not present

## 2015-03-24 DIAGNOSIS — J9811 Atelectasis: Secondary | ICD-10-CM | POA: Diagnosis not present

## 2015-03-24 DIAGNOSIS — I70242 Atherosclerosis of native arteries of left leg with ulceration of calf: Secondary | ICD-10-CM | POA: Diagnosis not present

## 2015-03-24 DIAGNOSIS — E785 Hyperlipidemia, unspecified: Secondary | ICD-10-CM | POA: Diagnosis not present

## 2015-03-24 DIAGNOSIS — R400 Somnolence: Secondary | ICD-10-CM | POA: Diagnosis present

## 2015-03-24 LAB — HEPATIC FUNCTION PANEL
ALT: 37 U/L (ref 17–63)
AST: 26 U/L (ref 15–41)
Albumin: 3.5 g/dL (ref 3.5–5.0)
Alkaline Phosphatase: 108 U/L (ref 38–126)
BILIRUBIN TOTAL: 0.4 mg/dL (ref 0.3–1.2)
Total Protein: 7.7 g/dL (ref 6.5–8.1)

## 2015-03-24 LAB — ETHANOL: Alcohol, Ethyl (B): <5 mg/dL (ref <5)

## 2015-03-24 LAB — URINALYSIS, ROUTINE W REFLEX MICROSCOPIC
Bilirubin Urine: NEGATIVE
GLUCOSE, UA: NEGATIVE mg/dL
KETONES UR: NEGATIVE mg/dL
Nitrite: POSITIVE — AB
PH: 6 (ref 5.0–8.0)
Protein, ur: NEGATIVE mg/dL
SPECIFIC GRAVITY, URINE: 1.013 (ref 1.005–1.030)
Urobilinogen, UA: 0.2 mg/dL (ref 0.0–1.0)

## 2015-03-24 LAB — CBC
HCT: 37.5 % — ABNORMAL LOW (ref 39.0–52.0)
HEMOGLOBIN: 12.6 g/dL — AB (ref 13.0–17.0)
MCH: 33.3 pg (ref 26.0–34.0)
MCHC: 33.6 g/dL (ref 30.0–36.0)
MCV: 99.2 fL (ref 78.0–100.0)
PLATELETS: 171 10*3/uL (ref 150–400)
RBC: 3.78 MIL/uL — AB (ref 4.22–5.81)
RDW: 13.9 % (ref 11.5–15.5)
WBC: 9.2 10*3/uL (ref 4.0–10.5)

## 2015-03-24 LAB — APTT: aPTT: 24 seconds (ref 24–37)

## 2015-03-24 LAB — BASIC METABOLIC PANEL
ANION GAP: 8 (ref 5–15)
BUN: 23 mg/dL — AB (ref 6–20)
CALCIUM: 8.9 mg/dL (ref 8.9–10.3)
CO2: 28 mmol/L (ref 22–32)
Chloride: 103 mmol/L (ref 101–111)
Creatinine, Ser: 1.37 mg/dL — ABNORMAL HIGH (ref 0.61–1.24)
GFR, EST NON AFRICAN AMERICAN: 53 mL/min — AB (ref >60)
GLUCOSE: 106 mg/dL — AB (ref 65–99)
POTASSIUM: 5.4 mmol/L — AB (ref 3.5–5.1)
SODIUM: 139 mmol/L (ref 135–145)

## 2015-03-24 LAB — RAPID URINE DRUG SCREEN, HOSP PERFORMED
AMPHETAMINES: NOT DETECTED
BARBITURATES: NOT DETECTED
BENZODIAZEPINES: NOT DETECTED
COCAINE: NOT DETECTED
OPIATES: NOT DETECTED
TETRAHYDROCANNABINOL: POSITIVE — AB

## 2015-03-24 LAB — BLOOD GAS, ARTERIAL
Acid-Base Excess: 1.8 mmol/L (ref 0.0–2.0)
Bicarbonate: 27.6 mEq/L — ABNORMAL HIGH (ref 20.0–24.0)
Drawn by: 295031
FIO2: 0.21
O2 Saturation: 89.9 %
PCO2 ART: 50.8 mmHg — AB (ref 35.0–45.0)
PH ART: 7.354 (ref 7.350–7.450)
Patient temperature: 98.6
TCO2: 25 mmol/L (ref 0–100)
pO2, Arterial: 61.2 mmHg — ABNORMAL LOW (ref 80.0–100.0)

## 2015-03-24 LAB — AMMONIA: Ammonia: 30 umol/L (ref 9–35)

## 2015-03-24 LAB — GLUCOSE, CAPILLARY: Glucose-Capillary: 89 mg/dL (ref 65–99)

## 2015-03-24 LAB — URINE MICROSCOPIC-ADD ON

## 2015-03-24 LAB — TYPE AND SCREEN
ABO/RH(D): O POS
Antibody Screen: NEGATIVE

## 2015-03-24 LAB — PROTIME-INR
INR: 1.12 (ref 0.00–1.49)
PROTHROMBIN TIME: 14.6 s (ref 11.6–15.2)

## 2015-03-24 LAB — CBG MONITORING, ED: Glucose-Capillary: 101 mg/dL — ABNORMAL HIGH (ref 65–99)

## 2015-03-24 MED ORDER — SODIUM CHLORIDE 0.9 % IV BOLUS (SEPSIS)
500.0000 mL | Freq: Once | INTRAVENOUS | Status: AC
  Administered 2015-03-24: 500 mL via INTRAVENOUS

## 2015-03-24 MED ORDER — SODIUM CHLORIDE 0.9 % IV SOLN
1000.0000 mL | INTRAVENOUS | Status: DC
Start: 2015-03-24 — End: 2015-03-25
  Administered 2015-03-24: 1000 mL via INTRAVENOUS

## 2015-03-24 NOTE — Telephone Encounter (Signed)
Please contact pt to arrange ED follow up. 

## 2015-03-24 NOTE — ED Notes (Signed)
RT notified of need for abg.

## 2015-03-24 NOTE — ED Notes (Signed)
STILL UNABLE TO COLLECT LABS

## 2015-03-24 NOTE — ED Notes (Signed)
Pt woken up to state that urine sample was needed. Pt acknowledged and stated he did not feel the need to void at this time. Pt has urinal at bedside

## 2015-03-24 NOTE — ED Notes (Signed)
Delay in lab draw pt not in room.  Nurse will draw labs.

## 2015-03-24 NOTE — ED Provider Notes (Signed)
CSN: [id number]     Arrival date & time 03/24/15  1522 History   First MD Initiated Contact with Patient 03/24/15 1706     Chief Complaint  Patient presents with  . Drowsiness   . Fall     HPI Pt went to the wound care clinic to be have his dressing changed for his ulcerated leg wound.  They noticed that he was very somnolent and were concerned.  Pt has not slept well the last few days.  Pt has fallen 3 times today. He says this is not a new thing.  He has been falling intermittently over the past year but 3 times in one day is usual.  No headache.  Speech feels slurred though.  No numbness or weakness.  No vomiting or diarrhea.  No fever.  No new medication changes.  Pt took 1 oxycodone tablet this am. Past Medical History  Diagnosis Date  . Vein, varicose   . Hyperlipidemia   . Varicose veins   . Complication of anesthesia     difficulty waking up  . Hypertension     Takes prinzide daily  . Diabetes mellitus     not consistent on taking medications  . Peripheral vascular disease ([place])   . Chronic kidney disease     acute renal failure in February admission  . Anemia     hx of blood transfusion  . Wound check, dressing change     dressing changes 3x/week; open wound  . Depression   . History of headache   . History of hepatitis C     competed treatment  . Neuromuscular disorder (HCC)     gout and neuropathy  . History of blood clots 1986 or 87    blood clot in groin?  . Gout    Past Surgical History  Procedure Laterality Date  . Varicose vein surgery    . Hemorrhoid surgery    . Vascular surgery  1980s    left leg  . Femoral-popliteal bypass graft  11/24/2011    Procedure: BYPASS GRAFT FEMORAL-POPLITEAL ARTERY;  Surgeon: Rosetta Posner, MD;  Location: [place];  Service: Vascular;  Laterality: Right;  . Abdominal aortagram N/A 11/17/2011    Procedure: ABDOMINAL AORTAGRAM;  Surgeon: Rosetta Posner, MD;  Location: [facility] CATH LAB;  Service: Cardiovascular;  Laterality: N/A;    Family History  Problem Relation Age of Onset  . Cancer Mother     liver?  Marland Kitchen Hyperlipidemia Father   . Hypertension Father   . Diabetes Father   . Alcohol abuse Father   . Arthritis Father   . Kidney disease Father   . Diabetes Son    Social History  Substance Use Topics  . Smoking status: Current Every Day Smoker -- 0.25 packs/day for 35 years    Types: Cigars, Cigarettes    Last Attempt to Quit: 11/12/2011  . Smokeless tobacco: Never Used     Comment: 3 cigars a day  . Alcohol Use: No    Review of Systems  Constitutional: Negative for fever.  Genitourinary: Negative for dysuria.  All other systems reviewed and are negative.     Allergies  Bactrim; Lisinopril; Metformin and related; Percocet; and Pork-derived products  Home Medications   Prior to Admission medications   Medication Sig Start Date End Date Taking? Authorizing Provider  allopurinol (ZYLOPRIM) 100 MG tablet Take 1 tablet (100 mg total) by mouth daily. 03/01/15  Yes Debbrah Alar, NP  amitriptyline (ELAVIL) 25 MG  tablet TAKE 2 TO 3 TABLETS AS DIRECTED AT BEDTIME.  Please keep appt as scheduled. Patient taking differently: Take 50-75 mg by mouth at bedtime. TAKE 2 TO 3 TABLETS AS DIRECTED AT BEDTIME.  Please keep appt as scheduled. 03/05/15  Yes Debbrah Alar, NP  aspirin EC 81 MG tablet Take 81 mg by mouth daily.   Yes Historical Provider, MD  atorvastatin (LIPITOR) 40 MG tablet Take 1 tablet (40 mg total) by mouth daily. 03/05/15  Yes Debbrah Alar, NP  betamethasone dipropionate (DIPROLENE) 0.05 % cream Apply topically 2 times daily Patient taking differently: Apply 1 application topically daily. Apply topically 2 times daily 03/05/15  Yes Debbrah Alar, NP  colchicine 0.6 MG tablet IF GOUT FLARE TAKE 2 TABLETS FOLLOWED BY 1 TABLET 1 HOUR LATER. THEN RESUME ONE TABLET DAILY. Patient taking differently: Take 1 tablet daily for 30 days then stop. 11/10/14  Yes Debbrah Alar, NP   Ferrous Sulfate (IRON) 325 (65 FE) MG TABS Take 1 tablet by mouth daily. 04/24/14  Yes Edward Saguier, PA-C  gabapentin (NEURONTIN) 800 MG tablet Take 1 tablet (800 mg total) by mouth 2 (two) times daily with a meal. 12/22/14  Yes Debbrah Alar, NP  hydrochlorothiazide (HYDRODIURIL) 25 MG tablet TAKE 1 TABLET  DAILY Patient taking differently: Take 25 mg by mouth daily.  03/05/15  Yes Debbrah Alar, NP  losartan (COZAAR) 25 MG tablet Take 1 tablet (25 mg total) by mouth daily. 03/05/15  Yes Debbrah Alar, NP  methadone (DOLOPHINE) 10 MG tablet Take 10 mg by mouth 3 (three) times daily. 03/29/13  Yes Historical Provider, MD  metoprolol succinate (TOPROL-XL) 25 MG 24 hr tablet Take 1 tablet (25 mg total) by mouth daily. 03/05/15  Yes Debbrah Alar, NP  mupirocin cream (BACTROBAN) 2 % Apply 1 application topically 2 (two) times daily. 07/14/14  Yes Debbrah Alar, NP  Oxycodone HCl 20 MG TABS Take 1 tablet (20 mg total) by mouth 4 (four) times daily as needed (for pain). Take 1/2 - 1 tablet (10-$RemoveBefo'20mg'pOzvaCRXEvY$ ) by mouth 2-4 times daily as needed for pain 11/27/11  Yes Regina J Roczniak, PA-C  pregabalin (LYRICA) 300 MG capsule Take 1 capsule (300 mg total) by mouth 2 (two) times daily. 01/01/15  Yes Debbrah Alar, NP  sitaGLIPtin (JANUVIA) 100 MG tablet Take 1 tablet (100 mg total) by mouth daily. 03/01/15  Yes Debbrah Alar, NP  vitamin B-12 (CYANOCOBALAMIN) 1000 MCG tablet Take 1,000 mcg by mouth daily.   Yes Historical Provider, MD  Alcohol Swabs (B-D SINGLE USE SWABS REGULAR) PADS Use to check blood sugar once a day. Dx E11.91 12/22/14   Debbrah Alar, NP  Blood Glucose Calibration (TRUE METRIX LEVEL 1) LOW SOLN Use as directed. Dx E11.9 12/22/14   Debbrah Alar, NP  Blood Glucose Monitoring Suppl (TRUE METRIX METER) W/DEVICE KIT Use to check blood sugar once a day. Dx E11.91 12/22/14   Debbrah Alar, NP  glucose blood (TRUE METRIX BLOOD GLUCOSE TEST) test strip Use as  instructed 12/22/14   Debbrah Alar, NP  lidocaine (XYLOCAINE) 5 % ointment Apply 1 application topically daily as needed. 12/22/14   Debbrah Alar, NP  TRUEPLUS LANCETS 28G MISC Use to check blood sugar once a day. Dx E11.91 12/22/14   Debbrah Alar, NP   BP 174/90 mmHg  Pulse 77  Temp(Src) 98 F (36.7 C) (Oral)  Resp 12  SpO2 97% Physical Exam  Constitutional: He is oriented to person, place, and time. He appears listless. No distress.  Overweight  HENT:  Head: Normocephalic and atraumatic.  Right Ear: External ear normal.  Left Ear: External ear normal.  Mouth/Throat: Oropharynx is clear and moist. No oropharyngeal exudate.  Eyes: Conjunctivae are normal. Right eye exhibits no discharge. Left eye exhibits no discharge. No scleral icterus.  Neck: Neck supple. No tracheal deviation present.  Cardiovascular: Normal rate, regular rhythm and intact distal pulses.   Pulmonary/Chest: Effort normal and breath sounds normal. No stridor. No respiratory distress. He has no wheezes. He has no rales.  Abdominal: Soft. Bowel sounds are normal. He exhibits no distension. There is no tenderness. There is no rebound and no guarding.  Musculoskeletal: He exhibits no edema or tenderness.  Clean dressing left lower extremity  Neurological: He is oriented to person, place, and time. He has normal strength. He appears listless. No cranial nerve deficit (no facial droop, extraocular movements intact, no slurred speech) or sensory deficit. He exhibits normal muscle tone. He displays no seizure activity. Coordination normal.  No pronator drift bilateral upper extrem, unabable to hold either legs off bed for 5 seconds, sensation intact in all extremities, no visual field cuts, no left or right sided neglect, nl finger to nose, no nystagmus noted   Skin: Skin is warm and dry. No rash noted.  Psychiatric: He has a normal mood and affect.  Nursing note and vitals reviewed.   ED Course   Procedures (including critical care time) Labs Review Labs Reviewed  BASIC METABOLIC PANEL - Abnormal; Notable for the following:    Potassium 5.4 (*)    Glucose, Bld 106 (*)    BUN 23 (*)    Creatinine, Ser 1.37 (*)    GFR calc non Af Amer 53 (*)    All other components within normal limits  URINALYSIS, ROUTINE W REFLEX MICROSCOPIC (NOT AT [facility]) - Abnormal; Notable for the following:    Hgb urine dipstick LARGE (*)    Nitrite POSITIVE (*)    Leukocytes, UA SMALL (*)    All other components within normal limits  BLOOD GAS, ARTERIAL - Abnormal; Notable for the following:    pCO2 arterial 50.8 (*)    pO2, Arterial 61.2 (*)    Bicarbonate 27.6 (*)    All other components within normal limits  URINE RAPID DRUG SCREEN, HOSP PERFORMED - Abnormal; Notable for the following:    Tetrahydrocannabinol POSITIVE (*)    All other components within normal limits  HEPATIC FUNCTION PANEL - Abnormal; Notable for the following:    Bilirubin, Direct <0.1 (*)    All other components within normal limits  CBC - Abnormal; Notable for the following:    RBC 3.78 (*)    Hemoglobin 12.6 (*)    HCT 37.5 (*)    All other components within normal limits  URINE MICROSCOPIC-ADD ON - Abnormal; Notable for the following:    Bacteria, UA FEW (*)    All other components within normal limits  CBG MONITORING, ED - Abnormal; Notable for the following:    Glucose-Capillary 101 (*)    All other components within normal limits  APTT  PROTIME-INR  AMMONIA  ETHANOL  TYPE AND SCREEN  ABO/RH    Imaging Review Dg Chest 1 View  03/24/2015  CLINICAL DATA:  LEFT lower extremity wound, drowsiness, has fallen 3 times, hypertension, diabetes mellitus, cough for 1 month, peripheral vascular disease, chronic kidney disease, smoker EXAM: CHEST 1 VIEW COMPARISON:  11/24/2011 FINDINGS: Normal heart size, mediastinal contours, and pulmonary vascularity. Minimal subsegmental atelectasis at lung bases.  Lungs otherwise clear.  No pleural effusion or pneumothorax. Osseous structures unremarkable. IMPRESSION: Minimal bibasilar atelectasis. Electronically Signed   By: Lavonia Dana M.D.   On: 03/24/2015 17:43   Ct Head Wo Contrast  03/24/2015  CLINICAL DATA:  Fall, lethargy EXAM: CT HEAD WITHOUT CONTRAST TECHNIQUE: Contiguous axial images were obtained from the base of the skull through the vertex without intravenous contrast. COMPARISON:  11/24/2012 FINDINGS: No skull fracture is noted. No intracranial hemorrhage, mass effect or midline shift. Mild atherosclerotic calcifications of carotid siphon. No acute cortical infarction. No mass lesion is noted on this unenhanced scan. There is no intra or extra-axial fluid collection. IMPRESSION: No acute intracranial abnormality. Electronically Signed   By: Lahoma Crocker M.D.   On: 03/24/2015 17:36   I have personally reviewed and evaluated these images and lab results as part of my medical decision-making.   EKG Interpretation   Date/Time:  Wednesday March 24 2015 17:43:54 EDT Ventricular Rate:  69 PR Interval:  205 QRS Duration: 133 QT Interval:  416 QTC Calculation: 446 R Axis:   -46 Text Interpretation:  Sinus rhythm RBBB and LAFB No significant change  since last tracing Confirmed by Mekhi Sonn  MD-J, Gerson Fauth ([id number]) on 03/24/2015  6:12:02 PM      MDM   Final diagnoses:  Lethargy    Patient was monitored in the emergency room for several hours. Seems less somnolent and more alert. Patient had something to eat. He was able to walk around the emergency room without difficulty.  His laboratory tests were unremarkable. His mild anemia but appears stable.Patient CBG showed a slight increase in his PCO2 but his pH is normal. The patient's body habitus suggests he may have a component of obstructive sleep apnea.  Slight  increases his potassium and renal function but I do not think this is significant.Chest x-ray and head CT did not show any acute abnormalities.  He is hydrating  well.  I doubt stroke, acute infection or significant dehydration.   It is possible some of his issues may be related to his medications. At this time there does not appear to be any evidence of an acute emergency medical condition and the patient appears stable for discharge with appropriate outpatient follow up.    Dorie Rank, MD 03/24/15 2158

## 2015-03-24 NOTE — ED Notes (Signed)
Difficulty obtaining IV and blood work, EDP made aware of the same

## 2015-03-24 NOTE — ED Notes (Signed)
Patient transported to CT 

## 2015-03-24 NOTE — ED Notes (Addendum)
Pt has a left lower extremity wound, was at wound center earlier today. Note from wound center states " due to pts drowsiness and the fact that he has fallen 3 times today I have recommended he get into a wheelchair and is taken to the ER for further workup. He is definitely not in any shape to drive his vehicle back to his home"  Upon rn assessment pt keeps talking about how he took his medicines on an empty stomach. Pt denies pain. Pt alert and oriented x4. Answering questions appropriately. CBG 101

## 2015-03-24 NOTE — Discharge Instructions (Signed)
Fatigue  Fatigue is feeling tired all of the time, a lack of energy, or a lack of motivation. Occasional or mild fatigue is often a normal response to activity or life in general. However, long-lasting (chronic) or extreme fatigue may indicate an underlying medical condition.  HOME CARE INSTRUCTIONS   Watch your fatigue for any changes. The following actions may help to lessen any discomfort you are feeling:  · Talk to your health care provider about how much sleep you need each night. Try to get the required amount every night.  · Take medicines only as directed by your health care provider.  · Eat a healthy and nutritious diet. Ask your health care provider if you need help changing your diet.  · Drink enough fluid to keep your urine clear or pale yellow.  · Practice ways of relaxing, such as yoga, meditation, massage therapy, or acupuncture.  · Exercise regularly.    · Change situations that cause you stress. Try to keep your work and personal routine reasonable.  · Do not abuse illegal drugs.  · Limit alcohol intake to no more than 1 drink per day for nonpregnant women and 2 drinks per day for men. One drink equals 12 ounces of beer, 5 ounces of wine, or 1½ ounces of hard liquor.  · Take a multivitamin, if directed by your health care provider.  SEEK MEDICAL CARE IF:   · Your fatigue does not get better.  · You have a fever.    · You have unintentional weight loss or gain.  · You have headaches.    · You have difficulty:      Falling asleep.    Sleeping throughout the night.  · You feel angry, guilty, anxious, or sad.     · You are unable to have a bowel movement (constipation).    · You skin is dry.     · Your legs or another part of your body is swollen.    SEEK IMMEDIATE MEDICAL CARE IF:   · You feel confused.    · Your vision is blurry.  · You feel faint or pass out.    · You have a severe headache.    · You have severe abdominal, pelvic, or back pain.    · You have chest pain, shortness of breath, or an  irregular or fast heartbeat.    · You are unable to urinate or you urinate less than normal.    · You develop abnormal bleeding, such as bleeding from the rectum, vagina, nose, lungs, or nipples.  · You vomit blood.     · You have thoughts about harming yourself or committing suicide.    · You are worried that you might harm someone else.       This information is not intended to replace advice given to you by your health care provider. Make sure you discuss any questions you have with your health care provider.     Document Released: 03/19/2007 Document Revised: 06/12/2014 Document Reviewed: 09/23/2013  Elsevier Interactive Patient Education ©2016 Elsevier Inc.

## 2015-03-25 LAB — ABO/RH: ABO/RH(D): O POS

## 2015-03-25 NOTE — Telephone Encounter (Signed)
Attempted to reach pt and VM is full.  

## 2015-03-27 LAB — URINE CULTURE: Culture: >=100000

## 2015-03-28 ENCOUNTER — Telehealth (HOSPITAL_COMMUNITY): Payer: Self-pay

## 2015-03-28 NOTE — Progress Notes (Signed)
ED Antimicrobial Stewardship Positive Culture Follow Up   [name] [name] is an 64 y.o. male who presented to [facility] on 03/24/2015 with a chief complaint of  Chief Complaint  Patient presents with  . Drowsiness   . Fall    Recent Results (from the past 720 hour(s))  Urine culture     Status: None   Collection Time: 03/24/15 11:03 PM  Result Value Ref Range Status   Specimen Description URINE, CLEAN CATCH  Final   Special Requests NONE  Final   Culture   Final    >=100,000 COLONIES/mL STAPHYLOCOCCUS SPECIES (COAGULASE NEGATIVE) Performed at [facility]    Report Status 03/27/2015 FINAL  Final   Organism ID, Bacteria STAPHYLOCOCCUS SPECIES (COAGULASE NEGATIVE)  Final      Susceptibility   Staphylococcus species (coagulase negative) - MIC*    CIPROFLOXACIN <=0.5 SENSITIVE Sensitive     GENTAMICIN <=0.5 SENSITIVE Sensitive     NITROFURANTOIN <=16 SENSITIVE Sensitive     OXACILLIN <=0.25 SENSITIVE Sensitive     TETRACYCLINE 2 SENSITIVE Sensitive     VANCOMYCIN <=0.5 SENSITIVE Sensitive     TRIMETH/SULFA <=10 SENSITIVE Sensitive     CLINDAMYCIN >=8 RESISTANT Resistant     RIFAMPIN <=0.5 SENSITIVE Sensitive     Inducible Clindamycin NEGATIVE Sensitive     * >=100,000 COLONIES/mL STAPHYLOCOCCUS SPECIES (COAGULASE NEGATIVE)    No urinary symptoms, so no treatment is indicated.  ED Provider: Teressa LowerVrinda Pickering, NP  Cassie L. Roseanne RenoStewart, PharmD PGY2 Infectious Diseases Pharmacy Resident Pager: [phone] 03/28/2015 10:50 AM

## 2015-03-28 NOTE — Telephone Encounter (Signed)
Post ED Visit - Positive Culture Follow-up  Culture report reviewed by antimicrobial stewardship pharmacist:  []  Celedonio MiyamotoJeremy Frens, Pharm.D., BCPS []  Georgina PillionElizabeth Martin, Pharm.D., BCPS []  Le RoyMinh Pham, [address]D., BCPS, AAHIVP []  Estella HuskMichelle Turner, Pharm.D., BCPS, AAHIVP []  [place]y Reyes, [address]arm[address] [x]  Tennis Mustassie Stewart, Pharm.D.  Positive urine culture  no further patient follow-up is required at this time, per Gabrielle DareVrinda Pickering  Aviya Jarvie, Leonor Livoni C 03/28/2015, 11:46 AM

## 2015-03-29 DIAGNOSIS — M1A9XX0 Chronic gout, unspecified, without tophus (tophi): Secondary | ICD-10-CM | POA: Diagnosis not present

## 2015-03-29 DIAGNOSIS — R262 Difficulty in walking, not elsewhere classified: Secondary | ICD-10-CM | POA: Diagnosis not present

## 2015-03-29 DIAGNOSIS — M25579 Pain in unspecified ankle and joints of unspecified foot: Secondary | ICD-10-CM | POA: Diagnosis not present

## 2015-03-29 DIAGNOSIS — E1142 Type 2 diabetes mellitus with diabetic polyneuropathy: Secondary | ICD-10-CM | POA: Diagnosis not present

## 2015-03-29 NOTE — Telephone Encounter (Signed)
No answer either phone # to schedule ED follow up.

## 2015-03-31 DIAGNOSIS — M109 Gout, unspecified: Secondary | ICD-10-CM | POA: Diagnosis not present

## 2015-03-31 DIAGNOSIS — I70242 Atherosclerosis of native arteries of left leg with ulceration of calf: Secondary | ICD-10-CM | POA: Diagnosis not present

## 2015-03-31 DIAGNOSIS — L97821 Non-pressure chronic ulcer of other part of left lower leg limited to breakdown of skin: Secondary | ICD-10-CM | POA: Diagnosis not present

## 2015-03-31 DIAGNOSIS — E11622 Type 2 diabetes mellitus with other skin ulcer: Secondary | ICD-10-CM | POA: Diagnosis not present

## 2015-03-31 DIAGNOSIS — I87332 Chronic venous hypertension (idiopathic) with ulcer and inflammation of left lower extremity: Secondary | ICD-10-CM | POA: Diagnosis not present

## 2015-03-31 DIAGNOSIS — D649 Anemia, unspecified: Secondary | ICD-10-CM | POA: Diagnosis not present

## 2015-03-31 DIAGNOSIS — Z9181 History of falling: Secondary | ICD-10-CM | POA: Diagnosis not present

## 2015-03-31 DIAGNOSIS — L97221 Non-pressure chronic ulcer of left calf limited to breakdown of skin: Secondary | ICD-10-CM | POA: Diagnosis not present

## 2015-04-06 ENCOUNTER — Telehealth: Payer: Self-pay | Admitting: *Deleted

## 2015-04-06 MED ORDER — ATORVASTATIN CALCIUM 40 MG PO TABS: 40.0000 mg | ORAL_TABLET | Freq: Every day | ORAL | Status: DC

## 2015-04-06 MED ORDER — ALLOPURINOL 100 MG PO TABS: 100.0000 mg | ORAL_TABLET | Freq: Every day | ORAL | Status: DC

## 2015-04-06 NOTE — Telephone Encounter (Signed)
Received [facility] from [facility] requesting 90 days supply of allopurinol and atorvastatin.  Refills sent.

## 2015-04-07 ENCOUNTER — Encounter (HOSPITAL_BASED_OUTPATIENT_CLINIC_OR_DEPARTMENT_OTHER): Payer: Medicare Other | Attending: Surgery

## 2015-04-07 DIAGNOSIS — F17200 Nicotine dependence, unspecified, uncomplicated: Secondary | ICD-10-CM | POA: Diagnosis not present

## 2015-04-07 DIAGNOSIS — E119 Type 2 diabetes mellitus without complications: Secondary | ICD-10-CM | POA: Insufficient documentation

## 2015-04-07 DIAGNOSIS — I70242 Atherosclerosis of native arteries of left leg with ulceration of calf: Secondary | ICD-10-CM | POA: Insufficient documentation

## 2015-04-07 DIAGNOSIS — I87332 Chronic venous hypertension (idiopathic) with ulcer and inflammation of left lower extremity: Secondary | ICD-10-CM | POA: Diagnosis not present

## 2015-04-07 DIAGNOSIS — L97821 Non-pressure chronic ulcer of other part of left lower leg limited to breakdown of skin: Secondary | ICD-10-CM | POA: Diagnosis not present

## 2015-04-07 LAB — GLUCOSE, CAPILLARY: GLUCOSE-CAPILLARY: 94 mg/dL (ref 65–99)

## 2015-04-14 ENCOUNTER — Telehealth: Payer: Self-pay | Admitting: Family

## 2015-04-14 DIAGNOSIS — F17200 Nicotine dependence, unspecified, uncomplicated: Secondary | ICD-10-CM | POA: Diagnosis not present

## 2015-04-14 DIAGNOSIS — I70242 Atherosclerosis of native arteries of left leg with ulceration of calf: Secondary | ICD-10-CM | POA: Diagnosis not present

## 2015-04-14 DIAGNOSIS — I87332 Chronic venous hypertension (idiopathic) with ulcer and inflammation of left lower extremity: Secondary | ICD-10-CM | POA: Diagnosis not present

## 2015-04-14 DIAGNOSIS — L97821 Non-pressure chronic ulcer of other part of left lower leg limited to breakdown of skin: Secondary | ICD-10-CM | POA: Diagnosis not present

## 2015-04-14 DIAGNOSIS — E119 Type 2 diabetes mellitus without complications: Secondary | ICD-10-CM | POA: Diagnosis not present

## 2015-04-14 LAB — GLUCOSE, CAPILLARY: Glucose-Capillary: 106 mg/dL — ABNORMAL HIGH (ref 65–99)

## 2015-04-14 NOTE — Telephone Encounter (Addendum)
Relation to ZO:XWRUpt:self Call back number: [phone] Pharmacy:  [id number] [place], [place] - [address] [facility] [address] [phone] (Phone) [phone] (Fax)          Reason for call:  Patient requesting a refill losartan (COZAAR) 25 MG tablet

## 2015-04-14 NOTE — Telephone Encounter (Signed)
Spoke with pt. Advised him that Rx was sent to pharmacy on 03/05/15, #90 x 1 refill. [facility].  Spoke with Marchelle [facility]ksAmanda at [facility] and confirmed they received 03/05/15 Rx and are showing it was filled on 03/05/15.  Notified pt and advised him to contact the pharmacy if he thinks this is incorrect.

## 2015-04-21 DIAGNOSIS — F17200 Nicotine dependence, unspecified, uncomplicated: Secondary | ICD-10-CM | POA: Diagnosis not present

## 2015-04-21 DIAGNOSIS — L97821 Non-pressure chronic ulcer of other part of left lower leg limited to breakdown of skin: Secondary | ICD-10-CM | POA: Diagnosis not present

## 2015-04-21 DIAGNOSIS — I87332 Chronic venous hypertension (idiopathic) with ulcer and inflammation of left lower extremity: Secondary | ICD-10-CM | POA: Diagnosis not present

## 2015-04-21 DIAGNOSIS — I70242 Atherosclerosis of native arteries of left leg with ulceration of calf: Secondary | ICD-10-CM | POA: Diagnosis not present

## 2015-04-21 DIAGNOSIS — E119 Type 2 diabetes mellitus without complications: Secondary | ICD-10-CM | POA: Diagnosis not present

## 2015-04-28 DIAGNOSIS — E119 Type 2 diabetes mellitus without complications: Secondary | ICD-10-CM | POA: Diagnosis not present

## 2015-04-28 DIAGNOSIS — R262 Difficulty in walking, not elsewhere classified: Secondary | ICD-10-CM | POA: Diagnosis not present

## 2015-04-28 DIAGNOSIS — M25579 Pain in unspecified ankle and joints of unspecified foot: Secondary | ICD-10-CM | POA: Diagnosis not present

## 2015-04-28 DIAGNOSIS — I87332 Chronic venous hypertension (idiopathic) with ulcer and inflammation of left lower extremity: Secondary | ICD-10-CM | POA: Diagnosis not present

## 2015-04-28 DIAGNOSIS — E1142 Type 2 diabetes mellitus with diabetic polyneuropathy: Secondary | ICD-10-CM | POA: Diagnosis not present

## 2015-04-28 DIAGNOSIS — F17200 Nicotine dependence, unspecified, uncomplicated: Secondary | ICD-10-CM | POA: Diagnosis not present

## 2015-04-28 DIAGNOSIS — L97821 Non-pressure chronic ulcer of other part of left lower leg limited to breakdown of skin: Secondary | ICD-10-CM | POA: Diagnosis not present

## 2015-04-28 DIAGNOSIS — I70242 Atherosclerosis of native arteries of left leg with ulceration of calf: Secondary | ICD-10-CM | POA: Diagnosis not present

## 2015-04-28 DIAGNOSIS — M1A9XX0 Chronic gout, unspecified, without tophus (tophi): Secondary | ICD-10-CM | POA: Diagnosis not present

## 2015-05-05 DIAGNOSIS — E119 Type 2 diabetes mellitus without complications: Secondary | ICD-10-CM | POA: Diagnosis not present

## 2015-05-05 DIAGNOSIS — F17200 Nicotine dependence, unspecified, uncomplicated: Secondary | ICD-10-CM | POA: Diagnosis not present

## 2015-05-05 DIAGNOSIS — I87332 Chronic venous hypertension (idiopathic) with ulcer and inflammation of left lower extremity: Secondary | ICD-10-CM | POA: Diagnosis not present

## 2015-05-05 DIAGNOSIS — E11622 Type 2 diabetes mellitus with other skin ulcer: Secondary | ICD-10-CM | POA: Diagnosis not present

## 2015-05-05 DIAGNOSIS — I70242 Atherosclerosis of native arteries of left leg with ulceration of calf: Secondary | ICD-10-CM | POA: Diagnosis not present

## 2015-05-05 DIAGNOSIS — L97821 Non-pressure chronic ulcer of other part of left lower leg limited to breakdown of skin: Secondary | ICD-10-CM | POA: Diagnosis not present

## 2015-05-10 ENCOUNTER — Other Ambulatory Visit: Payer: Self-pay | Admitting: Family

## 2015-05-12 ENCOUNTER — Encounter (HOSPITAL_BASED_OUTPATIENT_CLINIC_OR_DEPARTMENT_OTHER): Payer: Medicare Other | Attending: Surgery

## 2015-05-12 DIAGNOSIS — L97821 Non-pressure chronic ulcer of other part of left lower leg limited to breakdown of skin: Secondary | ICD-10-CM | POA: Insufficient documentation

## 2015-05-12 DIAGNOSIS — I70242 Atherosclerosis of native arteries of left leg with ulceration of calf: Secondary | ICD-10-CM | POA: Insufficient documentation

## 2015-05-12 DIAGNOSIS — E119 Type 2 diabetes mellitus without complications: Secondary | ICD-10-CM | POA: Diagnosis not present

## 2015-05-12 DIAGNOSIS — I87332 Chronic venous hypertension (idiopathic) with ulcer and inflammation of left lower extremity: Secondary | ICD-10-CM | POA: Insufficient documentation

## 2015-05-14 ENCOUNTER — Telehealth: Payer: Self-pay | Admitting: Family

## 2015-05-14 ENCOUNTER — Telehealth: Payer: Self-pay | Admitting: *Deleted

## 2015-05-14 MED ORDER — PREGABALIN 300 MG PO CAPS: 300.0000 mg | ORAL_CAPSULE | Freq: Two times a day (BID) | ORAL | Status: DC

## 2015-05-14 NOTE — Telephone Encounter (Signed)
Last lyrica Rx 01/01/15, #180. Pt has f/u 06/08/15 with PCP. Rx sent to PCP for signature

## 2015-05-14 NOTE — Telephone Encounter (Signed)
Relation to WU:JWJXpt:self Call back number:[phone] Pharmacy: [id number] [place], [place] - [address] [facility] [address] [phone] (Phone) [phone] (Fax)         Reason for call:  Patient requesting a refill pregabalin (LYRICA) 300 MG capsule

## 2015-05-14 NOTE — Telephone Encounter (Signed)
Received order request from [facility] for diabetes testing supplies.  Left message for pt to return my call to verify request as he had previously stated that he would use [facility] for diabetes supplies.

## 2015-05-14 NOTE — Telephone Encounter (Signed)
Rx faxed to pharmacy  

## 2015-05-19 DIAGNOSIS — L97821 Non-pressure chronic ulcer of other part of left lower leg limited to breakdown of skin: Secondary | ICD-10-CM | POA: Diagnosis not present

## 2015-05-19 DIAGNOSIS — E11622 Type 2 diabetes mellitus with other skin ulcer: Secondary | ICD-10-CM | POA: Diagnosis not present

## 2015-05-19 DIAGNOSIS — I70242 Atherosclerosis of native arteries of left leg with ulceration of calf: Secondary | ICD-10-CM | POA: Diagnosis not present

## 2015-05-19 DIAGNOSIS — I87332 Chronic venous hypertension (idiopathic) with ulcer and inflammation of left lower extremity: Secondary | ICD-10-CM | POA: Diagnosis not present

## 2015-05-19 DIAGNOSIS — E119 Type 2 diabetes mellitus without complications: Secondary | ICD-10-CM | POA: Diagnosis not present

## 2015-05-19 DIAGNOSIS — L97321 Non-pressure chronic ulcer of left ankle limited to breakdown of skin: Secondary | ICD-10-CM | POA: Diagnosis not present

## 2015-05-26 DIAGNOSIS — I87332 Chronic venous hypertension (idiopathic) with ulcer and inflammation of left lower extremity: Secondary | ICD-10-CM | POA: Diagnosis not present

## 2015-05-26 DIAGNOSIS — E11628 Type 2 diabetes mellitus with other skin complications: Secondary | ICD-10-CM | POA: Diagnosis not present

## 2015-05-26 DIAGNOSIS — E119 Type 2 diabetes mellitus without complications: Secondary | ICD-10-CM | POA: Diagnosis not present

## 2015-05-26 DIAGNOSIS — L97821 Non-pressure chronic ulcer of other part of left lower leg limited to breakdown of skin: Secondary | ICD-10-CM | POA: Diagnosis not present

## 2015-05-26 DIAGNOSIS — I70242 Atherosclerosis of native arteries of left leg with ulceration of calf: Secondary | ICD-10-CM | POA: Diagnosis not present

## 2015-05-26 NOTE — Telephone Encounter (Signed)
Form placed in blue folder on my desk. Will address at pt's f/u on 06/08/15.

## 2015-06-01 ENCOUNTER — Telehealth: Payer: Self-pay | Admitting: Family

## 2015-06-01 NOTE — Telephone Encounter (Signed)
Pt will need appt with another provider. Melissa out of office until 06/08/2015. Methadone has not been filled since 2014 that I see.

## 2015-06-01 NOTE — Telephone Encounter (Signed)
Caller name: Self  Can be reached: [phone]   Reason for call: Request refills on amitriptyline (ELAVIL) 25 MG tablet [[id number]][[id number]] --- Oxycodone HCl 20 MG TABS [[id number]][[id number]] --- methadone (DOLOPHINE) 10 MG tablet [[id number]][[id number]]

## 2015-06-02 DIAGNOSIS — I70242 Atherosclerosis of native arteries of left leg with ulceration of calf: Secondary | ICD-10-CM | POA: Diagnosis not present

## 2015-06-02 DIAGNOSIS — L97821 Non-pressure chronic ulcer of other part of left lower leg limited to breakdown of skin: Secondary | ICD-10-CM | POA: Diagnosis not present

## 2015-06-02 DIAGNOSIS — E119 Type 2 diabetes mellitus without complications: Secondary | ICD-10-CM | POA: Diagnosis not present

## 2015-06-02 DIAGNOSIS — I87332 Chronic venous hypertension (idiopathic) with ulcer and inflammation of left lower extremity: Secondary | ICD-10-CM | POA: Diagnosis not present

## 2015-06-02 NOTE — Telephone Encounter (Signed)
Scheduled patient appointment with Ramon DredgeEdward for Friday 12/30

## 2015-06-04 ENCOUNTER — Ambulatory Visit: Payer: Medicare Other | Admitting: Family

## 2015-06-04 ENCOUNTER — Telehealth: Payer: Self-pay | Admitting: Family

## 2015-06-04 ENCOUNTER — Encounter: Payer: Medicare Other | Admitting: Medical

## 2015-06-04 NOTE — Telephone Encounter (Signed)
Patient no show 06/04/15 at 1:15pm. Charge or no charge

## 2015-06-04 NOTE — Progress Notes (Signed)
This encounter was created in error - please disregard.

## 2015-06-07 NOTE — Telephone Encounter (Signed)
Yes please

## 2015-06-08 ENCOUNTER — Ambulatory Visit: Payer: Medicare Other | Admitting: Family

## 2015-06-09 ENCOUNTER — Other Ambulatory Visit: Payer: Self-pay | Admitting: Family

## 2015-06-09 ENCOUNTER — Encounter (HOSPITAL_BASED_OUTPATIENT_CLINIC_OR_DEPARTMENT_OTHER): Payer: Medicare Other | Attending: Surgery

## 2015-06-09 DIAGNOSIS — I87332 Chronic venous hypertension (idiopathic) with ulcer and inflammation of left lower extremity: Secondary | ICD-10-CM | POA: Insufficient documentation

## 2015-06-09 DIAGNOSIS — L97821 Non-pressure chronic ulcer of other part of left lower leg limited to breakdown of skin: Secondary | ICD-10-CM | POA: Diagnosis not present

## 2015-06-09 DIAGNOSIS — E119 Type 2 diabetes mellitus without complications: Secondary | ICD-10-CM | POA: Insufficient documentation

## 2015-06-09 DIAGNOSIS — L97521 Non-pressure chronic ulcer of other part of left foot limited to breakdown of skin: Secondary | ICD-10-CM | POA: Insufficient documentation

## 2015-06-09 DIAGNOSIS — I70242 Atherosclerosis of native arteries of left leg with ulceration of calf: Secondary | ICD-10-CM | POA: Diagnosis not present

## 2015-06-09 DIAGNOSIS — E11622 Type 2 diabetes mellitus with other skin ulcer: Secondary | ICD-10-CM | POA: Diagnosis not present

## 2015-06-09 DIAGNOSIS — L97321 Non-pressure chronic ulcer of left ankle limited to breakdown of skin: Secondary | ICD-10-CM | POA: Diagnosis not present

## 2015-06-16 DIAGNOSIS — I87332 Chronic venous hypertension (idiopathic) with ulcer and inflammation of left lower extremity: Secondary | ICD-10-CM | POA: Diagnosis not present

## 2015-06-16 DIAGNOSIS — I70248 Atherosclerosis of native arteries of left leg with ulceration of other part of lower left leg: Secondary | ICD-10-CM | POA: Diagnosis not present

## 2015-06-16 DIAGNOSIS — L97821 Non-pressure chronic ulcer of other part of left lower leg limited to breakdown of skin: Secondary | ICD-10-CM | POA: Diagnosis not present

## 2015-06-16 DIAGNOSIS — L97521 Non-pressure chronic ulcer of other part of left foot limited to breakdown of skin: Secondary | ICD-10-CM | POA: Diagnosis not present

## 2015-06-16 DIAGNOSIS — E119 Type 2 diabetes mellitus without complications: Secondary | ICD-10-CM | POA: Diagnosis not present

## 2015-06-16 DIAGNOSIS — E11622 Type 2 diabetes mellitus with other skin ulcer: Secondary | ICD-10-CM | POA: Diagnosis not present

## 2015-06-23 DIAGNOSIS — E11622 Type 2 diabetes mellitus with other skin ulcer: Secondary | ICD-10-CM | POA: Diagnosis not present

## 2015-06-23 DIAGNOSIS — L97521 Non-pressure chronic ulcer of other part of left foot limited to breakdown of skin: Secondary | ICD-10-CM | POA: Diagnosis not present

## 2015-06-23 DIAGNOSIS — E119 Type 2 diabetes mellitus without complications: Secondary | ICD-10-CM | POA: Diagnosis not present

## 2015-06-23 DIAGNOSIS — I87332 Chronic venous hypertension (idiopathic) with ulcer and inflammation of left lower extremity: Secondary | ICD-10-CM | POA: Diagnosis not present

## 2015-06-23 DIAGNOSIS — L97821 Non-pressure chronic ulcer of other part of left lower leg limited to breakdown of skin: Secondary | ICD-10-CM | POA: Diagnosis not present

## 2015-06-29 DIAGNOSIS — R262 Difficulty in walking, not elsewhere classified: Secondary | ICD-10-CM | POA: Diagnosis not present

## 2015-06-29 DIAGNOSIS — M25579 Pain in unspecified ankle and joints of unspecified foot: Secondary | ICD-10-CM | POA: Diagnosis not present

## 2015-06-29 DIAGNOSIS — E1142 Type 2 diabetes mellitus with diabetic polyneuropathy: Secondary | ICD-10-CM | POA: Diagnosis not present

## 2015-06-29 DIAGNOSIS — M1A9XX0 Chronic gout, unspecified, without tophus (tophi): Secondary | ICD-10-CM | POA: Diagnosis not present

## 2015-06-29 DIAGNOSIS — F1120 Opioid dependence, uncomplicated: Secondary | ICD-10-CM | POA: Diagnosis not present

## 2015-06-30 DIAGNOSIS — L97521 Non-pressure chronic ulcer of other part of left foot limited to breakdown of skin: Secondary | ICD-10-CM | POA: Diagnosis not present

## 2015-06-30 DIAGNOSIS — E119 Type 2 diabetes mellitus without complications: Secondary | ICD-10-CM | POA: Diagnosis not present

## 2015-06-30 DIAGNOSIS — I87322 Chronic venous hypertension (idiopathic) with inflammation of left lower extremity: Secondary | ICD-10-CM | POA: Diagnosis not present

## 2015-06-30 DIAGNOSIS — L97821 Non-pressure chronic ulcer of other part of left lower leg limited to breakdown of skin: Secondary | ICD-10-CM | POA: Diagnosis not present

## 2015-06-30 DIAGNOSIS — I87332 Chronic venous hypertension (idiopathic) with ulcer and inflammation of left lower extremity: Secondary | ICD-10-CM | POA: Diagnosis not present

## 2015-06-30 DIAGNOSIS — L97421 Non-pressure chronic ulcer of left heel and midfoot limited to breakdown of skin: Secondary | ICD-10-CM | POA: Diagnosis not present

## 2015-07-07 ENCOUNTER — Encounter (HOSPITAL_BASED_OUTPATIENT_CLINIC_OR_DEPARTMENT_OTHER): Payer: Medicare Other | Attending: Surgery

## 2015-07-07 DIAGNOSIS — L97521 Non-pressure chronic ulcer of other part of left foot limited to breakdown of skin: Secondary | ICD-10-CM | POA: Diagnosis not present

## 2015-07-07 DIAGNOSIS — I70242 Atherosclerosis of native arteries of left leg with ulceration of calf: Secondary | ICD-10-CM | POA: Diagnosis not present

## 2015-07-07 DIAGNOSIS — E11622 Type 2 diabetes mellitus with other skin ulcer: Secondary | ICD-10-CM | POA: Diagnosis not present

## 2015-07-07 DIAGNOSIS — I87332 Chronic venous hypertension (idiopathic) with ulcer and inflammation of left lower extremity: Secondary | ICD-10-CM | POA: Diagnosis not present

## 2015-07-07 LAB — GLUCOSE, CAPILLARY: GLUCOSE-CAPILLARY: 101 mg/dL — AB (ref 65–99)

## 2015-07-14 ENCOUNTER — Encounter (HOSPITAL_BASED_OUTPATIENT_CLINIC_OR_DEPARTMENT_OTHER): Payer: Medicare Other

## 2015-07-14 DIAGNOSIS — E11622 Type 2 diabetes mellitus with other skin ulcer: Secondary | ICD-10-CM | POA: Diagnosis not present

## 2015-07-14 DIAGNOSIS — L97521 Non-pressure chronic ulcer of other part of left foot limited to breakdown of skin: Secondary | ICD-10-CM | POA: Diagnosis not present

## 2015-07-27 DIAGNOSIS — M25579 Pain in unspecified ankle and joints of unspecified foot: Secondary | ICD-10-CM | POA: Diagnosis not present

## 2015-07-27 DIAGNOSIS — M1A9XX0 Chronic gout, unspecified, without tophus (tophi): Secondary | ICD-10-CM | POA: Diagnosis not present

## 2015-07-27 DIAGNOSIS — E1142 Type 2 diabetes mellitus with diabetic polyneuropathy: Secondary | ICD-10-CM | POA: Diagnosis not present

## 2015-07-27 DIAGNOSIS — R262 Difficulty in walking, not elsewhere classified: Secondary | ICD-10-CM | POA: Diagnosis not present

## 2015-07-28 DIAGNOSIS — E11621 Type 2 diabetes mellitus with foot ulcer: Secondary | ICD-10-CM | POA: Diagnosis not present

## 2015-07-28 DIAGNOSIS — L97521 Non-pressure chronic ulcer of other part of left foot limited to breakdown of skin: Secondary | ICD-10-CM | POA: Diagnosis not present

## 2015-08-09 ENCOUNTER — Other Ambulatory Visit: Payer: Self-pay | Admitting: Family

## 2015-08-09 NOTE — Telephone Encounter (Signed)
Lets send 2 week supply and arrange follow up please.

## 2015-08-09 NOTE — Telephone Encounter (Signed)
[name] [name], pt is almost 3 months overdue for follow up and no showed Jan. 2017 appt. Ok to refill 30 day supplies and schedule pt for follow up appt?

## 2015-08-10 ENCOUNTER — Telehealth: Payer: Self-pay | Admitting: Family

## 2015-08-10 NOTE — Telephone Encounter (Signed)
Medication filled to pharmacy as requested.   Shiquita, please schedule pt for follow up appt w/ Melissa this week or next so that we can continue to refill medications. A 2 week supply of requested meds has been sent to the pharmacy.

## 2015-08-10 NOTE — Telephone Encounter (Signed)
Form received, however, Pt is overdue for F/U appt w/ Melissa. Pt no showed 06/08/2015 appt.

## 2015-08-10 NOTE — Telephone Encounter (Signed)
Informed pt. Pt has been scheduled.

## 2015-08-10 NOTE — Telephone Encounter (Signed)
Caller name: Consuella Loselaine from [facility]  Call back number: [phone] and fax # [phone]    Reason for call:  [facility] checking on the status of diabetic supplies faxed form to [phone].

## 2015-08-10 NOTE — Telephone Encounter (Signed)
Spoke w/ Karin GoldenLorraine, informed her that we have received request for supplies however, Melissa will not be able to sign orders until Pt is seen for an appt. Karin GoldenLorraine verbalized understanding and informed she would let Pt know.

## 2015-08-11 ENCOUNTER — Encounter (HOSPITAL_BASED_OUTPATIENT_CLINIC_OR_DEPARTMENT_OTHER): Payer: Medicare Other | Attending: Surgery

## 2015-08-13 ENCOUNTER — Encounter: Payer: Self-pay | Admitting: Family

## 2015-08-13 ENCOUNTER — Ambulatory Visit (INDEPENDENT_AMBULATORY_CARE_PROVIDER_SITE_OTHER): Payer: Medicare Other | Admitting: Family

## 2015-08-13 VITALS — BP 111/60 | HR 57 | Temp 97.8°F | Resp 16 | Ht 73.0 in | Wt 295.8 lb

## 2015-08-13 DIAGNOSIS — B351 Tinea unguium: Secondary | ICD-10-CM

## 2015-08-13 DIAGNOSIS — E785 Hyperlipidemia, unspecified: Secondary | ICD-10-CM | POA: Diagnosis not present

## 2015-08-13 DIAGNOSIS — M1A9XX0 Chronic gout, unspecified, without tophus (tophi): Secondary | ICD-10-CM

## 2015-08-13 DIAGNOSIS — R600 Localized edema: Secondary | ICD-10-CM | POA: Diagnosis not present

## 2015-08-13 DIAGNOSIS — E1121 Type 2 diabetes mellitus with diabetic nephropathy: Secondary | ICD-10-CM | POA: Diagnosis not present

## 2015-08-13 DIAGNOSIS — E1169 Type 2 diabetes mellitus with other specified complication: Secondary | ICD-10-CM

## 2015-08-13 DIAGNOSIS — Z23 Encounter for immunization: Secondary | ICD-10-CM | POA: Diagnosis not present

## 2015-08-13 DIAGNOSIS — I10 Essential (primary) hypertension: Secondary | ICD-10-CM

## 2015-08-13 LAB — BASIC METABOLIC PANEL
BUN: 19 mg/dL (ref 6–23)
CHLORIDE: 108 meq/L (ref 96–112)
CO2: 28 mEq/L (ref 19–32)
Calcium: 9.2 mg/dL (ref 8.4–10.5)
Creatinine, Ser: 1.3 mg/dL (ref 0.40–1.50)
GFR: 71.48 mL/min (ref >60.00)
GLUCOSE: 93 mg/dL (ref 70–99)
POTASSIUM: 4.3 meq/L (ref 3.5–5.1)
SODIUM: 140 meq/L (ref 135–145)

## 2015-08-13 LAB — LIPID PANEL
CHOL/HDL RATIO: 5
Cholesterol: 112 mg/dL (ref 0–200)
HDL: 24.3 mg/dL — AB (ref >39.00)
LDL Cholesterol: 50 mg/dL (ref 0–99)
NONHDL: 87.52
TRIGLYCERIDES: 188 mg/dL — AB (ref 0.0–149.0)
VLDL: 37.6 mg/dL (ref 0.0–40.0)

## 2015-08-13 LAB — HEMOGLOBIN A1C: Hgb A1c MFr Bld: 6.7 % — ABNORMAL HIGH (ref 4.6–6.5)

## 2015-08-13 MED ORDER — FUROSEMIDE 20 MG PO TABS: 20.0000 mg | ORAL_TABLET | Freq: Every day | ORAL | Status: DC

## 2015-08-13 NOTE — Telephone Encounter (Signed)
Notified Karin GoldenLorraine that pt was seen in the office today and states he will not be using [facility] for his metformin. He will be using [facility] out [place].

## 2015-08-13 NOTE — Progress Notes (Signed)
Subjective:    Patient ID: [name] [name], male    DOB: 10/21/1950, 65 y.o.   MRN: [id number]  HPI  [name] [name] is a 65 yr old male who presents today for follow up of multiple medical problems:  1) HTN-  Currently maintained on losartan HCTZ, toprol xl.  Reports + edema, + DOE. BP Readings from Last 3 Encounters:  08/13/15 111/60  03/24/15 174/90  03/01/15 143/76   2) Hyperlipidemia- on lipitor 37m.   Lab Results  Component Value Date   CHOL 131 03/01/2015   HDL 25.10* 03/01/2015   LDLCALC 74 03/01/2015   LDLDIRECT 118.0 11/30/2014   TRIG 160.0* 03/01/2015   CHOLHDL 5 03/01/2015   3) DM2-  Maintained on januvia.  Reports that his sugars are 89-129.   Lab Results  Component Value Date   HGBA1C 7.4* 03/01/2015   HGBA1C 7.0* 11/30/2014   HGBA1C 7.4* 08/27/2014   Lab Results  Component Value Date   MICROALBUR 2.3* 03/01/2015   LDLCALC 74 03/01/2015   CREATININE 1.37* 03/24/2015   4) Gout- reports that he used colchicine last week. This resolved his pain (was in his foot).     Review of Systems    see HPI  Past Medical History  Diagnosis Date  . Vein, varicose   . Hyperlipidemia   . Varicose veins   . Complication of anesthesia     difficulty waking up  . Hypertension     Takes prinzide daily  . Diabetes mellitus     not consistent on taking medications  . Peripheral vascular disease ([facility]   . Chronic kidney disease     acute renal failure in February admission  . Anemia     hx of blood transfusion  . Wound check, dressing change     dressing changes 3x/week; open wound  . Depression   . History of headache   . History of hepatitis C     competed treatment  . Neuromuscular disorder (HCC)     gout and neuropathy  . History of blood clots 1986 or 87    blood clot in groin?  . Gout     Social History   Social History  . Marital Status: Single    Spouse Name: N/A  . Number of Children: 5  . Years of Education: N/A   Occupational History  .  Not on file.   Social History Main Topics  . Smoking status: Current Every Day Smoker -- 0.25 packs/day for 35 years    Types: Cigars, Cigarettes    Last Attempt to Quit: 11/12/2011  . Smokeless tobacco: Never Used     Comment: 3 cigars a day  . Alcohol Use: No  . Drug Use: No  . Sexual Activity: Not on file   Other Topics Concern  . Not on file   Social History Narrative   Regular exercise:  Walks a little every day.   Lives with 238yr old son.   Associated degree social services   Working on [employer].   Quit smoking few months ago.   Goes to [employer] [facility]- drug free since 1994.   Former Etoh abuse.    Past Surgical History  Procedure Laterality Date  . Varicose vein surgery    . Hemorrhoid surgery    . Vascular surgery  1980s    left leg  . Femoral-popliteal bypass graft  11/24/2011    Procedure: BYPASS GRAFT FEMORAL-POPLITEAL ARTERY;  Surgeon: TRosetta Posner MD;  Location: [facility] OR;  Service: Vascular;  Laterality: Right;  . Abdominal aortagram N/A 11/17/2011    Procedure: ABDOMINAL AORTAGRAM;  Surgeon: Todd F Early, MD;  Location: [facility] CATH LAB;  Service: Cardiovascular;  Laterality: N/A;    Family History  Problem Relation Age of Onset  . Cancer Mother     liver?  . Hyperlipidemia Father   . Hypertension Father   . Diabetes Father   . Alcohol abuse Father   . Arthritis Father   . Kidney disease Father   . Diabetes Son     Allergies  Allergen Reactions  . Bactrim [Sulfamethoxazole-Trimethoprim] Other (See Comments)    Blisters  . Lisinopril     Hyperkalemia   . Metformin And Related     Skin rash  . Percocet [Oxycodone-Acetaminophen] Nausea And Vomiting  . Pork-Derived Products     Current Outpatient Prescriptions on File Prior to Visit  Medication Sig Dispense Refill  . Alcohol Swabs (B-D SINGLE USE SWABS REGULAR) PADS Use to check blood sugar once a day. Dx E11.91 100 each 1  . allopurinol (ZYLOPRIM) 100 MG tablet Take 1 tablet (100 mg  total) by mouth daily. 90 tablet 1  . amitriptyline (ELAVIL) 25 MG tablet TAKE 2 TO 3 TABLETS AS DIRECTED AT BEDTIME. 42 tablet 0  . aspirin EC 81 MG tablet Take 81 mg by mouth daily.    . atorvastatin (LIPITOR) 40 MG tablet Take 1 tablet (40 mg total) by mouth daily. 90 tablet 1  . betamethasone dipropionate (DIPROLENE) 0.05 % cream Apply topically 2 times daily (Patient taking differently: Apply 1 application topically daily. Apply topically 2 times daily) 30 g 0  . Blood Glucose Calibration (TRUE METRIX LEVEL 1) LOW SOLN Use as directed. Dx E11.9 3 each 1  . Blood Glucose Monitoring Suppl (TRUE METRIX METER) W/DEVICE KIT Use to check blood sugar once a day. Dx E11.91 1 kit 0  . colchicine 0.6 MG tablet IF GOUT FLARE TAKE 2 TABLETS FOLLOWED BY 1 TABLET 1 HOUR LATER. THEN RESUME ONE TABLET DAILY. (Patient taking differently: Take 1 tablet daily for 30 days then stop.) 120 tablet 1  . Ferrous Sulfate (IRON) 325 (65 FE) MG TABS Take 1 tablet by mouth daily. 30 each 2  . gabapentin (NEURONTIN) 800 MG tablet Take 1 tablet (800 mg total) by mouth 2 (two) times daily with a meal. 180 tablet 1  . glucose blood (TRUE METRIX BLOOD GLUCOSE TEST) test strip Use as instructed 100 each 12  . hydrochlorothiazide (HYDRODIURIL) 12.5 MG tablet TAKE ONE TABLET BY MOUTH ONCE DAILY 14 tablet 0  . hydrochlorothiazide (HYDRODIURIL) 25 MG tablet TAKE 1 TABLET  DAILY (Patient taking differently: Take 25 mg by mouth daily. ) 90 tablet 1  . lidocaine (XYLOCAINE) 5 % ointment APPLY 1 GRAM TOPICALLY TO THE AFFECTED AREA FOUR TIMES DAILY 35.44 g 1  . losartan (COZAAR) 25 MG tablet TAKE ONE TABLET BY MOUTH ONCE DAILY 14 tablet 0  . methadone (DOLOPHINE) 10 MG tablet Take 10 mg by mouth 3 (three) times daily.    . metoprolol succinate (TOPROL-XL) 25 MG 24 hr tablet TAKE ONE TABLET BY MOUTH ONCE DAILY 14 tablet 0  . mupirocin cream (BACTROBAN) 2 % Apply 1 application topically 2 (two) times daily. 45 g 1  . Oxycodone HCl 20 MG  TABS Take 1 tablet (20 mg total) by mouth 4 (four) times daily as needed (for pain). Take 1/2 - 1 tablet (10-20mg) by mouth 2-4 times daily   as needed for pain 30 tablet 0  . pregabalin (LYRICA) 300 MG capsule Take 1 capsule (300 mg total) by mouth 2 (two) times daily. 180 capsule 0  . sitaGLIPtin (JANUVIA) 100 MG tablet Take 1 tablet (100 mg total) by mouth daily. 30 tablet 5  . TRUEPLUS LANCETS 28G MISC Use to check blood sugar once a day. Dx E11.91 100 each 1  . vitamin B-12 (CYANOCOBALAMIN) 1000 MCG tablet Take 1,000 mcg by mouth daily.     No current facility-administered medications on file prior to visit.    BP 111/60 mmHg  Pulse 57  Temp(Src) 97.8 F (36.6 C) (Oral)  Resp 16  Ht 6' 1" (1.854 m)  Wt 295 lb 12.8 oz (134.174 kg)  BMI 39.03 kg/m2  SpO2 99%    Objective:   Physical Exam  Constitutional: He is oriented to person, place, and time. He appears well-developed and well-nourished. No distress.  HENT:  Head: Normocephalic and atraumatic.  Cardiovascular: Normal rate and regular rhythm.   No murmur heard. Pulmonary/Chest: Effort normal and breath sounds normal. No respiratory distress. He has no wheezes. He has no rales.  Musculoskeletal: He exhibits no edema.  3+ bilateral LE edma  Neurological: He is alert and oriented to person, place, and time.  Skin: Skin is warm and dry.  Psychiatric: He has a normal mood and affect. His behavior is normal. Thought content normal.          Assessment & Plan:  Onychomycosis- refer to podiatry. 

## 2015-08-13 NOTE — Assessment & Plan Note (Signed)
Stable cbgs, continue [place]uvia. Obtain a1c.

## 2015-08-13 NOTE — Assessment & Plan Note (Signed)
Obtain lipid panel, continue statin.   

## 2015-08-13 NOTE — Addendum Note (Signed)
Addended by: Mervin KungFERGERSON, Jorden Mahl A on: 08/13/2015 02:15 PM   Modules accepted: Orders

## 2015-08-13 NOTE — Patient Instructions (Addendum)
Please complete lab work prior to leaving. You will be contacted about your referral to podiatry. Stop HCTZ, start lasix 20mg  once daily for swelling. You will be contacted about scheduling your echocardiogram to check your heart.  Follow up in 2 weeks.

## 2015-08-13 NOTE — Progress Notes (Signed)
Pre visit review using our clinic review tool, if applicable. No additional management support is needed unless otherwise documented below in the visit note. 

## 2015-08-13 NOTE — Assessment & Plan Note (Addendum)
Still having flares, unfortunately he needs diuretics and this can worsen gout symptoms. Continue allopurinol, prn colchicine. Obtain uric acid level.

## 2015-08-13 NOTE — Assessment & Plan Note (Signed)
BP looks good.  Due to LE edema, I am going to change his HCTZ to lasix once daily and have him come back in 2 weeks for follow up. We will need repeat bmet at that time. Will also plan to obtain a 2D echo at that time.

## 2015-08-23 DIAGNOSIS — M25579 Pain in unspecified ankle and joints of unspecified foot: Secondary | ICD-10-CM | POA: Diagnosis not present

## 2015-08-23 DIAGNOSIS — R262 Difficulty in walking, not elsewhere classified: Secondary | ICD-10-CM | POA: Diagnosis not present

## 2015-08-23 DIAGNOSIS — M1A9XX0 Chronic gout, unspecified, without tophus (tophi): Secondary | ICD-10-CM | POA: Diagnosis not present

## 2015-08-23 DIAGNOSIS — E1142 Type 2 diabetes mellitus with diabetic polyneuropathy: Secondary | ICD-10-CM | POA: Diagnosis not present

## 2015-08-26 ENCOUNTER — Other Ambulatory Visit: Payer: Self-pay | Admitting: Family

## 2015-08-30 ENCOUNTER — Other Ambulatory Visit: Payer: Self-pay | Admitting: Family

## 2015-09-01 ENCOUNTER — Ambulatory Visit (HOSPITAL_BASED_OUTPATIENT_CLINIC_OR_DEPARTMENT_OTHER)
Admission: RE | Admit: 2015-09-01 | Discharge: 2015-09-01 | Disposition: A | Discharge status: Home / Self Care | Payer: Medicare Other | Source: Ambulatory Visit | Attending: Family | Admitting: Family

## 2015-09-01 DIAGNOSIS — I1310 Hypertensive heart and chronic kidney disease without heart failure, with stage 1 through stage 4 chronic kidney disease, or unspecified chronic kidney disease: Secondary | ICD-10-CM | POA: Insufficient documentation

## 2015-09-01 DIAGNOSIS — I739 Peripheral vascular disease, unspecified: Secondary | ICD-10-CM | POA: Diagnosis not present

## 2015-09-01 DIAGNOSIS — E785 Hyperlipidemia, unspecified: Secondary | ICD-10-CM | POA: Diagnosis not present

## 2015-09-01 DIAGNOSIS — N189 Chronic kidney disease, unspecified: Secondary | ICD-10-CM | POA: Diagnosis not present

## 2015-09-01 DIAGNOSIS — R600 Localized edema: Secondary | ICD-10-CM | POA: Insufficient documentation

## 2015-09-01 DIAGNOSIS — Z720 Tobacco use: Secondary | ICD-10-CM | POA: Insufficient documentation

## 2015-09-01 DIAGNOSIS — E1122 Type 2 diabetes mellitus with diabetic chronic kidney disease: Secondary | ICD-10-CM | POA: Diagnosis not present

## 2015-09-01 NOTE — Progress Notes (Signed)
  Echocardiogram 2D Echocardiogram has been performed.  Arvil ChacoFoster, Johari Bennetts 09/01/2015, 3:06 PM

## 2015-09-02 ENCOUNTER — Encounter: Payer: Self-pay | Admitting: Family

## 2015-09-15 ENCOUNTER — Ambulatory Visit (INDEPENDENT_AMBULATORY_CARE_PROVIDER_SITE_OTHER): Payer: Medicare Other | Admitting: Podiatry

## 2015-09-15 DIAGNOSIS — Q828 Other specified congenital malformations of skin: Secondary | ICD-10-CM | POA: Diagnosis not present

## 2015-09-15 DIAGNOSIS — M79676 Pain in unspecified toe(s): Secondary | ICD-10-CM | POA: Diagnosis not present

## 2015-09-15 DIAGNOSIS — E1149 Type 2 diabetes mellitus with other diabetic neurological complication: Secondary | ICD-10-CM

## 2015-09-15 DIAGNOSIS — Z87898 Personal history of other specified conditions: Secondary | ICD-10-CM | POA: Diagnosis not present

## 2015-09-15 DIAGNOSIS — B351 Tinea unguium: Secondary | ICD-10-CM

## 2015-09-15 DIAGNOSIS — L84 Corns and callosities: Secondary | ICD-10-CM

## 2015-09-15 NOTE — Progress Notes (Signed)
Subjective:     Patient ID: [name] [name], male   DOB: [birth year], 65 y.o.   MRN: [id number]  HPI 65 year old male presents the also concerns of thick, painful, elongated toenails that he cannot trim himself. Denies any swelling or redness or any drainage, toenails. Has a history of an ulceration to the left top of the foot as well as the outside aspect of the right foot. Currently denies he swelling or redness or any open sores. He is diabetic and states that he has neuropathy. He states in gabapentin which seems to help. He was 1 the wound care center for the wounds but discontinued as the wounds of healed. No other complaints.  Last A1c is 6.7 No claudication symptoms.   Review of Systems  All other systems reviewed and are negative.      Objective:   Physical Exam General: AAO x3, NAD  Dermatological: Nails are hypertrophic, dystrophic, discolored, brittle, Elongated 10. There is no surrounding erythema or drainage from either tenderness nails 1-5 bilaterally. Thick pre-ulcerative callus right fifth metatarsal base. Upon debridement no underlying ulceration, drainage or other signs of infection. Area of scar the dorsal aspect left foot just distal to the ankle from previous wound. There is dry skin bilaterally. No other open lesions or pre-ulcer lesions identified at this time.  Vascular: Dorsalis Pedis artery and Posterior Tibial artery pedal pulses are decreased. No varicosities and no lower extremity edema present bilateral. There is no pain with calf compression, swelling, warmth, erythema.   Neruologic: Sensation decreased with Dorann OuSimms Weinstein monofilament, decreased vibratory sensation.  Musculoskeletal: Hammertoes present. No pain, crepitus, or limitation noted with foot and ankle range of motion bilateral. Muscular strength 5/5 in all groups tested bilateral.  Gait: Unassisted, Nonantalgic.      Assessment:     65 year old male presents for diabetic risk assessment,  symptomatic onychomycosis, pre-ulcerative calluses.    Plan:     .-Treatment options discussed including all alternatives, risks, and complications -Nails debrided 10 without complications or bleeding. -Pre-ulcerative callus debris 1 without complications or bleeding -Discussed today for inspection. -Wish rigidus he did not apply interdigitally. -Discussed daily foot inspection monitor for any further wounds or any skin breakdown. -Follow-up in 3 months or sooner if any problems arise. In the meantime, encouraged to call the office with any questions, concerns, change in symptoms.   Ovid CurdMatthew Lamarco Gudiel, DPM

## 2015-09-23 DIAGNOSIS — M25579 Pain in unspecified ankle and joints of unspecified foot: Secondary | ICD-10-CM | POA: Diagnosis not present

## 2015-09-23 DIAGNOSIS — R262 Difficulty in walking, not elsewhere classified: Secondary | ICD-10-CM | POA: Diagnosis not present

## 2015-09-23 DIAGNOSIS — E1142 Type 2 diabetes mellitus with diabetic polyneuropathy: Secondary | ICD-10-CM | POA: Diagnosis not present

## 2015-09-23 DIAGNOSIS — M1A9XX0 Chronic gout, unspecified, without tophus (tophi): Secondary | ICD-10-CM | POA: Diagnosis not present

## 2015-10-01 DIAGNOSIS — H35033 Hypertensive retinopathy, bilateral: Secondary | ICD-10-CM | POA: Diagnosis not present

## 2015-10-01 DIAGNOSIS — I10 Essential (primary) hypertension: Secondary | ICD-10-CM | POA: Diagnosis not present

## 2015-10-01 DIAGNOSIS — Z7984 Long term (current) use of oral hypoglycemic drugs: Secondary | ICD-10-CM | POA: Diagnosis not present

## 2015-10-01 DIAGNOSIS — E119 Type 2 diabetes mellitus without complications: Secondary | ICD-10-CM | POA: Diagnosis not present

## 2015-10-01 LAB — HM DIABETES EYE EXAM

## 2015-10-05 ENCOUNTER — Telehealth: Payer: Self-pay | Admitting: Family

## 2015-10-05 NOTE — Telephone Encounter (Signed)
Patient called left message on my voicemail requesting back dated referrals from last year. We are unable to back date referrals from last year. Please see referrals from last year where patient was made aware of this. I was unable to leave voicemail on patients voicemail box was full.

## 2015-10-05 NOTE — Telephone Encounter (Signed)
Referral notes were [place] to [place] on

## 2015-10-05 NOTE — Telephone Encounter (Signed)
Called patient to get more information regarding this concern however his voicemail is not set up

## 2015-10-05 NOTE — Telephone Encounter (Signed)
Spoke with patient and was made aware that on 12/09/14 insurance denied. Patient said he would call back with in network dr. Patient never called back and was seen by the out of network dr. Patient is now receiving bill for over $600.00. Wants me to do more. I told him I was unable to help him any further and that I would have to forward to management. He told me not to take my bad day out on him, I was very nice to him, he was still very angry with me, so I forwarded call to Tiffany.

## 2015-10-05 NOTE — Telephone Encounter (Signed)
02/23/2015. Please advise

## 2015-10-06 NOTE — Telephone Encounter (Signed)
Noted.  We cannot back date the referrals.

## 2015-10-09 ENCOUNTER — Other Ambulatory Visit: Payer: Self-pay | Admitting: Family

## 2015-10-11 ENCOUNTER — Other Ambulatory Visit: Payer: Self-pay | Admitting: Family

## 2015-10-21 ENCOUNTER — Telehealth: Payer: Self-pay | Admitting: Family

## 2015-10-21 DIAGNOSIS — E1142 Type 2 diabetes mellitus with diabetic polyneuropathy: Secondary | ICD-10-CM | POA: Diagnosis not present

## 2015-10-21 DIAGNOSIS — R262 Difficulty in walking, not elsewhere classified: Secondary | ICD-10-CM | POA: Diagnosis not present

## 2015-10-21 DIAGNOSIS — M1A9XX0 Chronic gout, unspecified, without tophus (tophi): Secondary | ICD-10-CM | POA: Diagnosis not present

## 2015-10-21 DIAGNOSIS — M25579 Pain in unspecified ankle and joints of unspecified foot: Secondary | ICD-10-CM | POA: Diagnosis not present

## 2015-10-21 NOTE — Telephone Encounter (Signed)
Caller name: Marylene Landngela with [employer] Relationship to patient: Can be reached: [phone] [facility]   Reason for call: Marylene Landngela called and conf in the pt to the line - pt needing new diabetic meter, test strips and lancets - Please send order in for one of the 3 meters w/supplies listed below that are covered by his insurance:  One Touch Ultra 2 One Touch Ultra Mini AccuCheck [employer] Plus

## 2015-10-22 IMAGING — CR DG CHEST 1V
2 series · 2 of 2 positions shown · non-contrast
Comparison: 11/24/2011

CLINICAL DATA: LEFT lower extremity wound, drowsiness, has fallen 3
times, hypertension, diabetes mellitus, cough for 1 month,
peripheral vascular disease, chronic kidney disease, smoker

EXAM:
CHEST 1 VIEW

[x chest ap (1 of 2)]
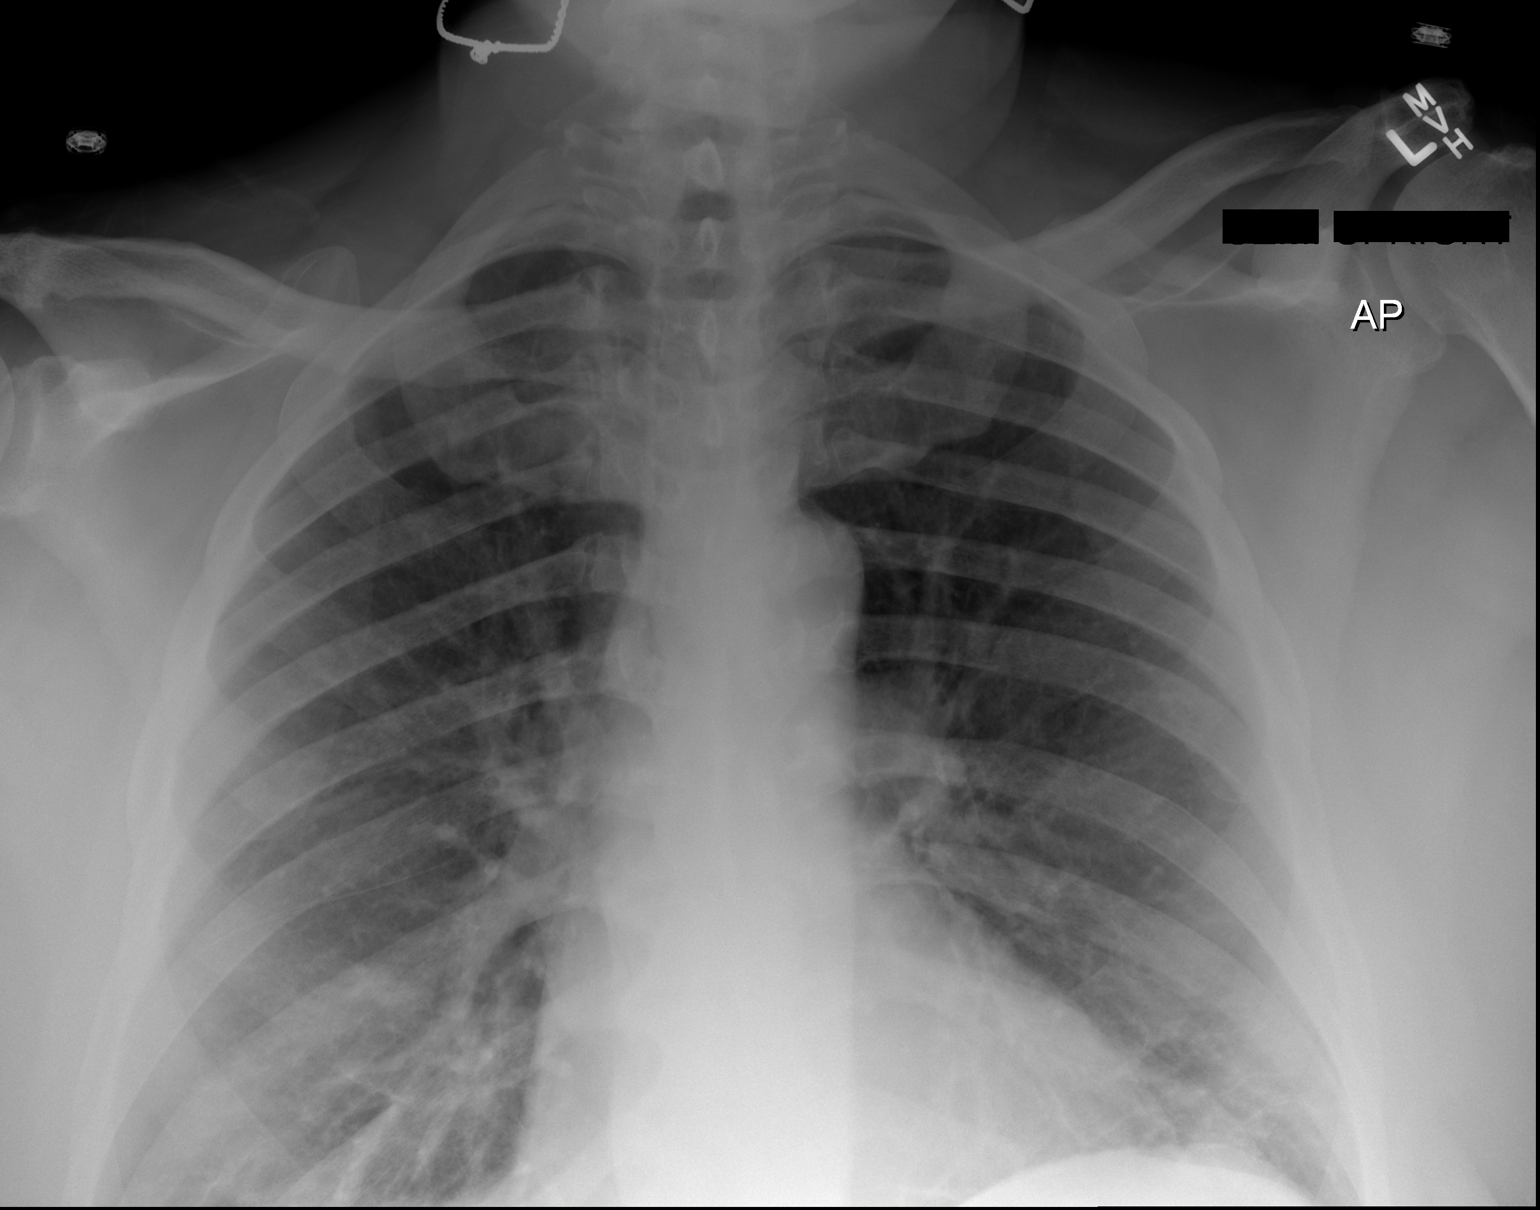

[x chest ap (2 of 2)]
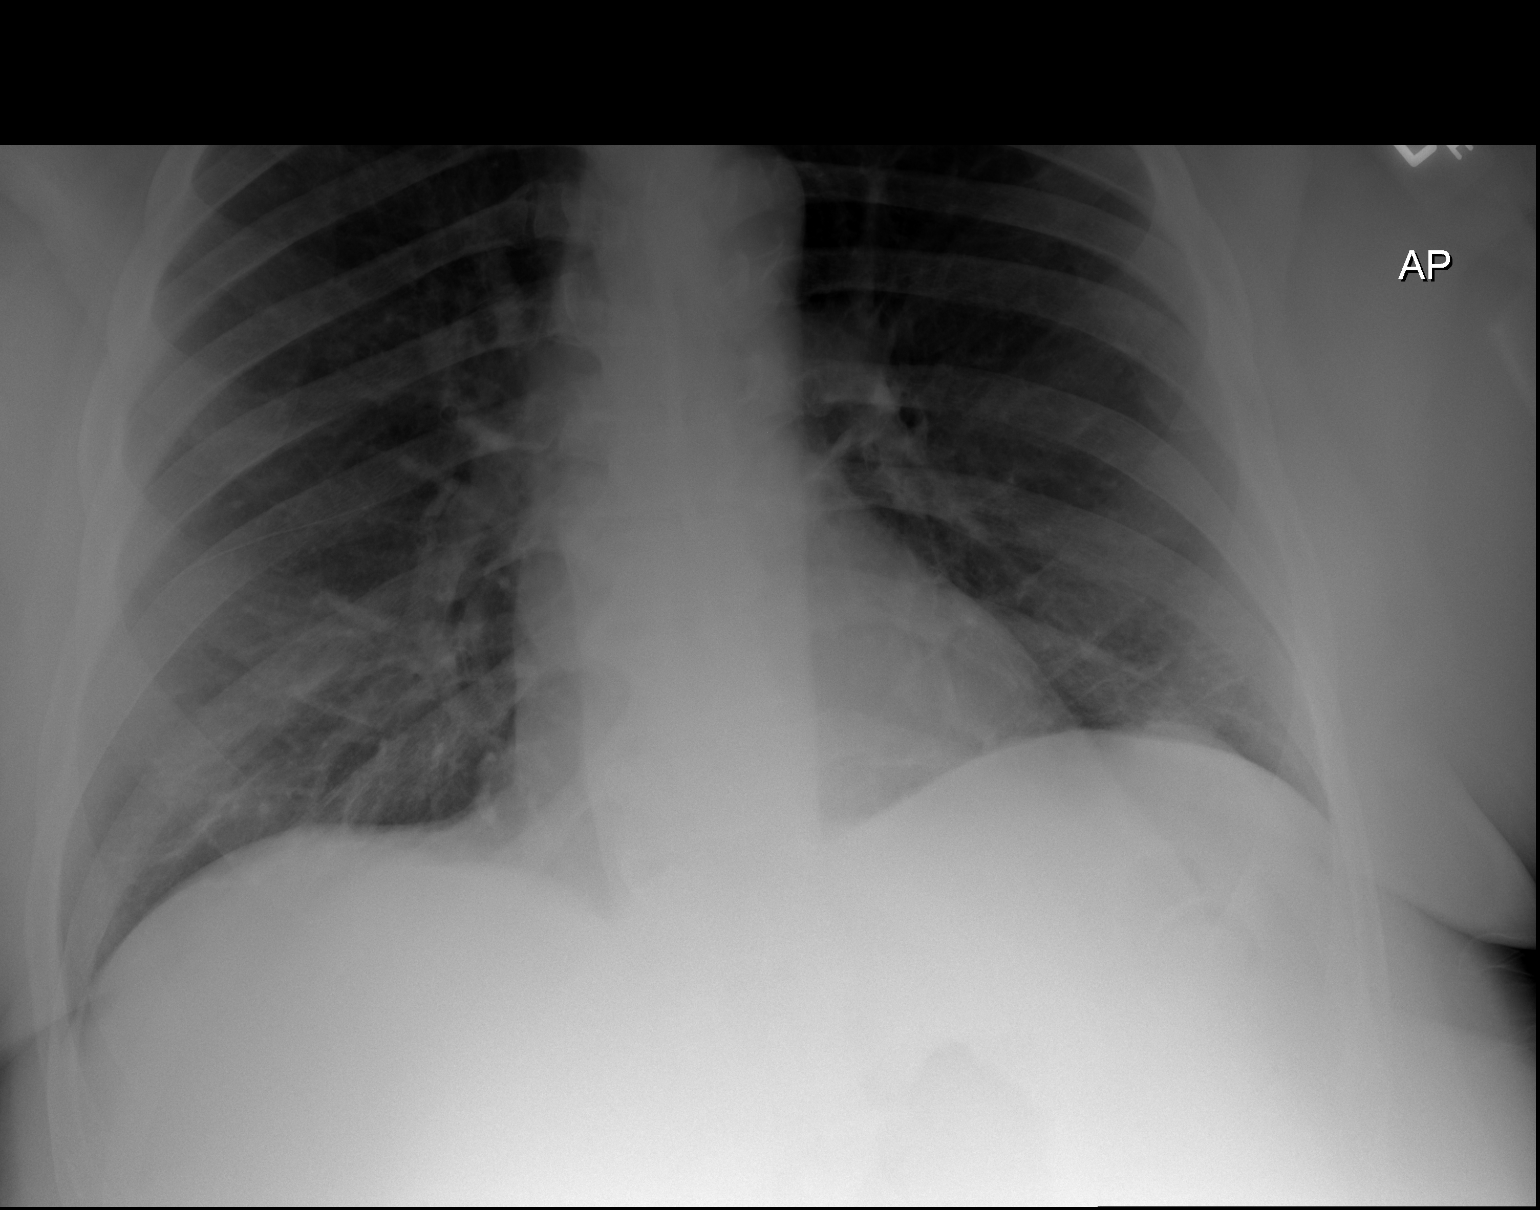

[2 of 2 positions shown; findings below may reference images not displayed]

FINDINGS: Normal heart size, mediastinal contours, and pulmonary vascularity.

Minimal subsegmental atelectasis at lung bases.

Lungs otherwise clear.

No pleural effusion or pneumothorax.

Osseous structures unremarkable.
IMPRESSION: Minimal bibasilar atelectasis.

## 2015-10-22 MED ORDER — GLUCOSE BLOOD VI STRP: ORAL_STRIP | Status: DC

## 2015-10-22 MED ORDER — ONETOUCH ULTRA 2 W/DEVICE KIT: PACK | Status: DC

## 2015-10-22 MED ORDER — ONETOUCH ULTRASOFT LANCETS MISC: Status: DC

## 2015-10-22 NOTE — Telephone Encounter (Signed)
Rx's sent for one touch ultra 2

## 2015-10-26 ENCOUNTER — Telehealth: Payer: Self-pay | Admitting: Family

## 2015-10-26 NOTE — Telephone Encounter (Signed)
°  Relationship to patient: Pharmacy Can be reached: [phone]  Pharmacy:  [facility] [address] - [place], [place] - [address] [phone] (Phone) [phone])        Reason for call: Patient states that the lancets ordered does not go to the meter he has and he needs a rx for One Touch Delica lancets

## 2015-10-27 ENCOUNTER — Telehealth: Payer: Self-pay | Admitting: Family

## 2015-10-27 MED ORDER — PREGABALIN 300 MG PO CAPS: 300.0000 mg | ORAL_CAPSULE | Freq: Two times a day (BID) | ORAL | Status: DC

## 2015-10-27 MED ORDER — ONETOUCH DELICA LANCETS 33G MISC: Status: DC

## 2015-10-27 NOTE — Telephone Encounter (Signed)
Caller name: [facility] at [facility] Can be reached: [phone]   Reason for call: Pt requesting refill on Lyrica and is out of medication. She states she called [facility] to transfer RX and was advised there were 0 refills left.

## 2015-10-27 NOTE — Telephone Encounter (Signed)
New rx sent to pharmacy

## 2015-10-27 NOTE — Telephone Encounter (Signed)
90 day supply of lyrica called to [facility]. Pt is due for 3 month follow up on 11/13/15.  Please call pt to schedule appt with Melissa. Thanks!

## 2015-10-28 NOTE — Telephone Encounter (Signed)
Called to inform pt of the below. Pt says that he will call back in to schedule his FU with PCP.    ALSO pt also says that he need a handicap sticker form completed by PCP. He says that he would like to have a call back when ready for pick up.

## 2015-10-29 NOTE — Telephone Encounter (Signed)
Handicap permit initiated and forwarded to PCP for completion / signature.

## 2015-11-02 NOTE — Telephone Encounter (Signed)
completed and placed in Jessica's folder.

## 2015-11-04 NOTE — Telephone Encounter (Signed)
Completed form mailed to pt's home address. Copy sent for scanning. JG//CMA  

## 2015-11-08 ENCOUNTER — Other Ambulatory Visit: Payer: Self-pay | Admitting: Family

## 2015-11-08 NOTE — Telephone Encounter (Deleted)
Pt due for follow up, routed to Chronic Care Management RN.

## 2015-11-15 ENCOUNTER — Encounter: Payer: Self-pay | Admitting: Family

## 2015-11-16 DIAGNOSIS — M1A9XX0 Chronic gout, unspecified, without tophus (tophi): Secondary | ICD-10-CM | POA: Diagnosis not present

## 2015-11-16 DIAGNOSIS — R262 Difficulty in walking, not elsewhere classified: Secondary | ICD-10-CM | POA: Diagnosis not present

## 2015-11-16 DIAGNOSIS — M25579 Pain in unspecified ankle and joints of unspecified foot: Secondary | ICD-10-CM | POA: Diagnosis not present

## 2015-11-16 DIAGNOSIS — E1142 Type 2 diabetes mellitus with diabetic polyneuropathy: Secondary | ICD-10-CM | POA: Diagnosis not present

## 2015-11-19 ENCOUNTER — Other Ambulatory Visit: Payer: Self-pay | Admitting: Family

## 2015-11-19 NOTE — Telephone Encounter (Signed)
Requesting Amitriptyline 25mg -Take 2-3 tablets by mouth daily at bedtime as directed. Last refill:08/27/15;#90,2 Last OV:08/13/15 Please advise.//AB/CMA

## 2015-11-22 ENCOUNTER — Other Ambulatory Visit: Payer: Self-pay | Admitting: Family

## 2015-11-22 NOTE — Telephone Encounter (Signed)
Caller name: Relationship to patient: Self Can be reached: [id number] - [place], [place] - [phone] (Phone) [phone])        Reason for call: Request 90 day supply of amitriptyline (ELAVIL) 25 MG tablet

## 2015-11-23 ENCOUNTER — Telehealth: Payer: Self-pay | Admitting: *Deleted

## 2015-11-23 MED ORDER — GABAPENTIN 800 MG PO TABS: 800.0000 mg | ORAL_TABLET | Freq: Three times a day (TID) | ORAL | Status: DC

## 2015-11-23 NOTE — Telephone Encounter (Signed)
The Amitriptyline 25mg  was filled on (11/19/15-#90,2).//AB/CMA

## 2015-11-23 NOTE — Telephone Encounter (Signed)
Noted and agree. 

## 2015-11-23 NOTE — Telephone Encounter (Signed)
[facility]. 800mg  three times daily (prescribed most recently by pain doctor). Previous directions we had were for twice daily dosing. Pt requesting refill from PCP.  Please advise?  Shiquita---  Pt last seen by PCP 08/13/15 and advised to follow up in 2 weeks. Pt is past due for follow up. Please call him to arrange f/u with Melissa soon.

## 2015-11-23 NOTE — Telephone Encounter (Signed)
Rx denied.  Pt was taking of the HCTZ.//AB/CMA

## 2015-11-23 NOTE — Telephone Encounter (Signed)
Ok to send 1 month supply for TID please.

## 2015-11-23 NOTE — Telephone Encounter (Signed)
Pt stated that he is having a flare-up of gout and he is out of the Colchicine, so he is requesting a refill for Colchicine 0.6mg .  Last refill:11/10/14;#120,1.  Please advise.//AB/CMA   Called and spoke with the pt and informed him that the prescription request has been approved.  Informed the pt that [facility]  So he needs to schedule an appt .  Pt verbalized understanding an agreed.  Pt was scheduled an appt with Melissa on (Mon-11/29/15).//AB/CMA

## 2015-11-24 NOTE — Telephone Encounter (Signed)
[name] Sheehandvised [name] [name] that 30 day supply was sent until pt could be seen in the office as he is past due for follow up.

## 2015-11-24 NOTE — Telephone Encounter (Signed)
Caller name: Victorino DikeJennifer Relation to pt: Call back number: [phone] [id number] - [place], [place] - [address]   Reason for call: Pharmacy requesting a day 90 supply of gabapentin (NEURONTIN) 800 MG tablet

## 2015-11-27 DIAGNOSIS — R310 Gross hematuria: Secondary | ICD-10-CM | POA: Diagnosis not present

## 2015-11-27 DIAGNOSIS — R52 Pain, unspecified: Secondary | ICD-10-CM | POA: Diagnosis not present

## 2015-11-27 DIAGNOSIS — I2510 Atherosclerotic heart disease of native coronary artery without angina pectoris: Secondary | ICD-10-CM | POA: Diagnosis not present

## 2015-11-27 DIAGNOSIS — M10071 Idiopathic gout, right ankle and foot: Secondary | ICD-10-CM | POA: Diagnosis not present

## 2015-11-27 DIAGNOSIS — M25561 Pain in right knee: Secondary | ICD-10-CM | POA: Diagnosis not present

## 2015-11-27 DIAGNOSIS — M10061 Idiopathic gout, right knee: Secondary | ICD-10-CM | POA: Diagnosis not present

## 2015-11-27 DIAGNOSIS — I10 Essential (primary) hypertension: Secondary | ICD-10-CM | POA: Diagnosis not present

## 2015-11-27 DIAGNOSIS — R509 Fever, unspecified: Secondary | ICD-10-CM | POA: Diagnosis not present

## 2015-11-27 DIAGNOSIS — Z7984 Long term (current) use of oral hypoglycemic drugs: Secondary | ICD-10-CM | POA: Diagnosis not present

## 2015-11-27 DIAGNOSIS — M25571 Pain in right ankle and joints of right foot: Secondary | ICD-10-CM | POA: Diagnosis not present

## 2015-11-27 DIAGNOSIS — Z79899 Other long term (current) drug therapy: Secondary | ICD-10-CM | POA: Diagnosis not present

## 2015-11-27 DIAGNOSIS — M10062 Idiopathic gout, left knee: Secondary | ICD-10-CM | POA: Diagnosis not present

## 2015-11-27 DIAGNOSIS — E119 Type 2 diabetes mellitus without complications: Secondary | ICD-10-CM | POA: Diagnosis not present

## 2015-11-27 DIAGNOSIS — M79606 Pain in leg, unspecified: Secondary | ICD-10-CM | POA: Diagnosis not present

## 2015-11-29 ENCOUNTER — Ambulatory Visit: Payer: Medicare Other | Admitting: Family

## 2015-11-29 DIAGNOSIS — Z0289 Encounter for other administrative examinations: Secondary | ICD-10-CM

## 2015-11-30 ENCOUNTER — Telehealth: Payer: Self-pay | Admitting: Family

## 2015-11-30 NOTE — Telephone Encounter (Signed)
[id number] - [place], [place] - [address]  Reason for call:  Pharmacy requesting a 90 day supply gabapentin (NEURONTIN) 800 MG tablet

## 2015-11-30 NOTE — Telephone Encounter (Signed)
Notified Prianca per 11/23/15 phone note that only 30 day supply is being given until pt is seen in the office. Pt was scheduled to see PCP on 11/29/15 and did not show for appt. No further refills will be given until he is seen.

## 2015-12-01 ENCOUNTER — Other Ambulatory Visit: Payer: Self-pay | Admitting: Family

## 2015-12-02 ENCOUNTER — Telehealth: Payer: Self-pay

## 2015-12-02 ENCOUNTER — Other Ambulatory Visit: Payer: Self-pay

## 2015-12-02 MED ORDER — ALLOPURINOL 100 MG PO TABS: 100.0000 mg | ORAL_TABLET | Freq: Every day | ORAL | Status: DC

## 2015-12-02 MED ORDER — COLCHICINE 0.6 MG PO TABS: ORAL_TABLET | ORAL | Status: DC

## 2015-12-02 MED FILL — COLCHICINE 0.6 MG TABLET: 0.6 | 83 days supply | Qty: 100 | Fill #0

## 2015-12-02 NOTE — Telephone Encounter (Signed)
Received a call from Mr [name] [name] who states he has been in the bed with Right sided joint pain for 3 weeks. States he has had NO chest pain nor Shortness of breath. Says because of insurance he has no Gout medications. He states his blood pressure has been slightly elevated at 137/81 and he has been running a low grade fever at 99.0. Please advise

## 2015-12-02 NOTE — Telephone Encounter (Signed)
Refills sent on gout meds.  Needs OV please at his earliest convenience.

## 2015-12-02 NOTE — Telephone Encounter (Signed)
[name]. Patient states he needs to go to [facility]. Patient states he will call to schedule appointment as soon as possible. Reminded patient that we do have wheelchairs if that would help.

## 2015-12-03 ENCOUNTER — Telehealth: Payer: Self-pay | Admitting: Family

## 2015-12-03 ENCOUNTER — Telehealth: Payer: Self-pay | Admitting: *Deleted

## 2015-12-03 MED FILL — ALLOPURINOL 100 MG TABLET: 100 | 90 days supply | Qty: 90 | Fill #0

## 2015-12-03 NOTE — Telephone Encounter (Signed)
Received via fax.  Pt needs face to face visit no more then 90 days of request date. Last OV was over this time frame, pt no showed for June 2017 appt.  Please call pt and try to reschedule appt in order to have form completed. Thanks.

## 2015-12-03 NOTE — Telephone Encounter (Signed)
Patient was No Show on 6/26. Last No was 01/19/2016. Charge or No Charge?

## 2015-12-03 NOTE — Telephone Encounter (Signed)
Called patient no pick up,unable to leave message.

## 2015-12-04 NOTE — Telephone Encounter (Signed)
Yes please

## 2015-12-06 ENCOUNTER — Encounter: Payer: Self-pay | Admitting: Family

## 2015-12-06 ENCOUNTER — Other Ambulatory Visit: Payer: Self-pay | Admitting: Family

## 2015-12-06 MED ORDER — PREGABALIN 300 MG PO CAPS: 300.0000 mg | ORAL_CAPSULE | Freq: Two times a day (BID) | ORAL | Status: DC

## 2015-12-06 MED ORDER — ATORVASTATIN CALCIUM 40 MG PO TABS: 40.0000 mg | ORAL_TABLET | Freq: Every day | ORAL | Status: DC

## 2015-12-06 NOTE — Telephone Encounter (Signed)
Kathi Simpersricia A Fergerson, CMA at 11/30/2015 3:39 PM     Status: Signed       Expand All Collapse All   Notified Prianca per 11/23/15 phone note that only 30 day supply is being given until pt is seen in the office. Pt was scheduled to see PCP on 11/29/15 and did not show for appt. No further refills will be given until he is seen.        Patient has no future appointments scheduled as of yet; Please Advise on refills for Atorvastatin & Lyrica/SLS 07/03

## 2015-12-06 NOTE — Telephone Encounter (Signed)
Spoke to pt. He tells me his in [place]. Advised pt I will send refills to his local pharmacy.  He is advised to call our office on Wednesday AM to arrange a follow up appointment.

## 2015-12-10 ENCOUNTER — Other Ambulatory Visit: Payer: Self-pay | Admitting: Family

## 2015-12-10 NOTE — Telephone Encounter (Signed)
Apt scheduled for 12/15/15 at 1:15.    KP

## 2015-12-15 ENCOUNTER — Ambulatory Visit (INDEPENDENT_AMBULATORY_CARE_PROVIDER_SITE_OTHER): Payer: Medicare Other | Admitting: Family

## 2015-12-15 ENCOUNTER — Encounter: Payer: Medicare Other | Admitting: Podiatry

## 2015-12-15 ENCOUNTER — Encounter: Payer: Self-pay | Admitting: Family

## 2015-12-15 VITALS — BP 100/58 | HR 61 | Temp 97.8°F | Ht 72.5 in | Wt 252.8 lb

## 2015-12-15 DIAGNOSIS — M109 Gout, unspecified: Secondary | ICD-10-CM

## 2015-12-15 DIAGNOSIS — E1169 Type 2 diabetes mellitus with other specified complication: Secondary | ICD-10-CM

## 2015-12-15 DIAGNOSIS — G629 Polyneuropathy, unspecified: Secondary | ICD-10-CM

## 2015-12-15 DIAGNOSIS — E1142 Type 2 diabetes mellitus with diabetic polyneuropathy: Secondary | ICD-10-CM

## 2015-12-15 DIAGNOSIS — D649 Anemia, unspecified: Secondary | ICD-10-CM | POA: Diagnosis not present

## 2015-12-15 DIAGNOSIS — I10 Essential (primary) hypertension: Secondary | ICD-10-CM

## 2015-12-15 MED ORDER — PREDNISONE 10 MG PO TABS: ORAL_TABLET | ORAL | Status: DC

## 2015-12-15 MED FILL — predniSONE 10 MG TABS: 10 | 8 days supply | Qty: 20 | Fill #0

## 2015-12-15 NOTE — Progress Notes (Signed)
Pre visit review using our clinic review tool, if applicable. No additional management support is needed unless otherwise documented below in the visit note. 

## 2015-12-15 NOTE — Progress Notes (Signed)
Subjective:    Patient ID: [name] [name], male    DOB: Oct 12, 1950, 65 y.o.   MRN: [id number]  HPI  [name] [name] is a 65 yr old male who presents today for follow up.  1) DM2- Pt is maintained on metformin and januvia. Pt reports that his sugars are generally 120's-130's. Lab Results  Component Value Date   HGBA1C 6.7* 08/13/2015   HGBA1C 7.4* 03/01/2015   HGBA1C 7.0* 11/30/2014   Lab Results  Component Value Date   MICROALBUR 2.3* 03/01/2015   LDLCALC 50 08/13/2015   CREATININE 1.30 08/13/2015   2) HTN- on lasix once daily, losartan, and hctz.   BP Readings from Last 3 Encounters:  12/15/15 100/58  08/13/15 111/60  03/24/15 174/90   3) Peripheral neuropathy- on gabapentin, lyrica.  4) Gout flare- was seen at [facility].  He has bilateral foot pain R>L, not improved colchicine.  Wt Readings from Last 3 Encounters:  12/15/15 252 lb 12.8 oz (114.669 kg)  08/13/15 295 lb 12.8 oz (134.174 kg)  03/01/15 310 lb 12.8 oz (140.978 kg)     Review of Systems See HPI  Past Medical History  Diagnosis Date  . Vein, varicose   . Hyperlipidemia   . Varicose veins   . Complication of anesthesia     difficulty waking up  . Hypertension     Takes prinzide daily  . Diabetes mellitus     not consistent on taking medications  . Peripheral vascular disease ([place])   . Chronic kidney disease     acute renal failure in February admission  . Anemia     hx of blood transfusion  . Wound check, dressing change     dressing changes 3x/week; open wound  . Depression   . History of headache   . History of hepatitis C     competed treatment  . Neuromuscular disorder (HCC)     gout and neuropathy  . History of blood clots 1986 or 87    blood clot in groin?  . Gout      Social History   Social History  . Marital Status: Single    Spouse Name: N/A  . Number of Children: 5  . Years of Education: N/A   Occupational History  . Not on file.   Social History Main Topics  .  Smoking status: Former Smoker -- 0.25 packs/day for 35 years    Types: Cigars, Cigarettes    Quit date: 12/07/2015  . Smokeless tobacco: Never Used     Comment: 3 cigars a day  . Alcohol Use: No  . Drug Use: No  . Sexual Activity: Not on file   Other Topics Concern  . Not on file   Social History Narrative   Regular exercise:  Walks a little every day.   Lives with 51 yr old son.   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   Quit smoking few months ago.   Goes to [facility] [facility]- drug free since 1994.   Former Etoh abuse.    Past Surgical History  Procedure Laterality Date  . Varicose vein surgery    . Hemorrhoid surgery    . Vascular surgery  1980s    left leg  . Femoral-popliteal bypass graft  11/24/2011    Procedure: BYPASS GRAFT FEMORAL-POPLITEAL ARTERY;  Surgeon: Rosetta Posner, MD;  Location: [place];  Service: Vascular;  Laterality: Right;  . Abdominal aortagram N/A 11/17/2011  Procedure: ABDOMINAL AORTAGRAM;  Surgeon: Rosetta Posner, MD;  Location: [facility] CATH LAB;  Service: Cardiovascular;  Laterality: N/A;    Family History  Problem Relation Age of Onset  . Cancer Mother     liver?  Marland Kitchen Hyperlipidemia Father   . Hypertension Father   . Diabetes Father   . Alcohol abuse Father   . Arthritis Father   . Kidney disease Father   . Diabetes Son     Allergies  Allergen Reactions  . Bactrim [Sulfamethoxazole-Trimethoprim] Other (See Comments)    Blisters  . Lisinopril     Hyperkalemia   . Metformin And Related     Skin rash  . Percocet [Oxycodone-Acetaminophen] Nausea And Vomiting  . Pork-Derived Products   . Acetaminophen Nausea And Vomiting    Current Outpatient Prescriptions on File Prior to Visit  Medication Sig Dispense Refill  . Alcohol Swabs (B-D SINGLE USE SWABS REGULAR) PADS Use to check blood sugar once a day. Dx E11.91 100 each 1  . allopurinol (ZYLOPRIM) 100 MG tablet Take 1 tablet (100 mg total) by mouth daily. 90 tablet 1    . amitriptyline (ELAVIL) 25 MG tablet TAKE 2-3 TABLETS BY MOUTH DAILY AT BEDTIME AS DIRECTED. KEEP SCHEDULED APPOINTMENT 90 tablet 2  . aspirin EC 81 MG tablet Take 81 mg by mouth daily.    Marland Kitchen atorvastatin (LIPITOR) 40 MG tablet Take 1 tablet (40 mg total) by mouth daily. 90 tablet 0  . betamethasone dipropionate (DIPROLENE) 0.05 % cream Apply topically 2 times daily (Patient taking differently: Apply 1 application topically daily. Apply topically 2 times daily) 30 g 0  . Blood Glucose Monitoring Suppl (ONE TOUCH ULTRA 2) w/Device KIT USE TO CHECK BLOOD SUGAR ONCE A DAY. DX E11.9 1 each 0  . colchicine 0.6 MG tablet IF GOUT FLARE TAKE 2 TABLETS FOLLOWED BY 1 TABLET 1 HOUR LATER. THEN RESUME ONE TABLET DAILY. 120 tablet 1  . Ferrous Sulfate (IRON) 325 (65 FE) MG TABS Take 1 tablet by mouth daily. 30 each 2  . furosemide (LASIX) 20 MG tablet TAKE ONE TABLET BY MOUTH ONCE DAILY 90 tablet 0  . gabapentin (NEURONTIN) 800 MG tablet Take 1 tablet (800 mg total) by mouth 3 (three) times daily. 90 tablet 0  . glucose blood test strip ONE TOUCH ULTRA. Use as instructed to check blood sugar once a day.  DX.. E11.9 100 each 1  . JANUVIA 100 MG tablet TAKE ONE TABLET BY MOUTH ONCE DAILY 90 tablet 1  . lidocaine (XYLOCAINE) 5 % ointment APPLY 1 GRAM TOPICALLY TO THE AFFECTED AREA FOUR TIMES DAILY 35.44 g 1  . losartan (COZAAR) 25 MG tablet TAKE ONE TABLET BY MOUTH ONCE DAILY. NO FUTHER REFILLS WITHOUT APPOINTMENT 90 tablet 0  . methadone (DOLOPHINE) 10 MG tablet Take 10 mg by mouth 3 (three) times daily.    . metoprolol succinate (TOPROL-XL) 25 MG 24 hr tablet TAKE ONE TABLET BY MOUTH ONCE DAILY. NO FURTHER REFILLS WITHOUT APPOINTMENT 90 tablet 0  . mupirocin cream (BACTROBAN) 2 % Apply 1 application topically 2 (two) times daily. 45 g 1  . ONETOUCH DELICA LANCETS 69S MISC Use to check blood sugar once a day.  Dx  E11.9 100 each 1  . Oxycodone HCl 20 MG TABS Take 1 tablet (20 mg total) by mouth 4 (four) times  daily as needed (for pain). Take 1/2 - 1 tablet (10-'20mg'$ ) by mouth 2-4 times daily as needed for pain 30 tablet 0  .  pregabalin (LYRICA) 300 MG capsule Take 1 capsule (300 mg total) by mouth 2 (two) times daily. 60 capsule 0  . vitamin B-12 (CYANOCOBALAMIN) 1000 MCG tablet Take 1,000 mcg by mouth daily.     No current facility-administered medications on file prior to visit.    BP 100/58 mmHg  Pulse 61  Temp(Src) 97.8 F (36.6 C) (Oral)  Ht 6' 0.5" (1.842 m)  Wt 252 lb 12.8 oz (114.669 kg)  BMI 33.80 kg/m2  SpO2 91%       Objective:   Physical Exam  Constitutional: He is oriented to person, place, and time. He appears well-developed and well-nourished. No distress.  HENT:  Head: Normocephalic and atraumatic.  Cardiovascular: Normal rate and regular rhythm.   No murmur heard. 3+ RLE edema, 2-3+ LLE edema  Pulmonary/Chest: Effort normal and breath sounds normal. No respiratory distress. He has no wheezes. He has no rales.  Neurological: He is alert and oriented to person, place, and time.  Skin: Skin is warm and dry.  Psychiatric: He has a normal mood and affect. His behavior is normal. Thought content normal.          Assessment & Plan:

## 2015-12-15 NOTE — Patient Instructions (Signed)
Please complete lab work prior to leaving. Increase furosemide (lasix) to twice daily for 3 days, then back down to once daily. This should help with your swelling in your feet. Begin prednisone for your gout. Check your sugar twice daily while on prednisone. Call me if sugar >300.

## 2015-12-16 ENCOUNTER — Other Ambulatory Visit: Payer: Medicare Other

## 2015-12-17 DIAGNOSIS — M1A9XX0 Chronic gout, unspecified, without tophus (tophi): Secondary | ICD-10-CM | POA: Diagnosis not present

## 2015-12-17 DIAGNOSIS — M545 Low back pain: Secondary | ICD-10-CM | POA: Diagnosis not present

## 2015-12-17 DIAGNOSIS — E1142 Type 2 diabetes mellitus with diabetic polyneuropathy: Secondary | ICD-10-CM | POA: Diagnosis not present

## 2015-12-17 DIAGNOSIS — M25571 Pain in right ankle and joints of right foot: Secondary | ICD-10-CM | POA: Diagnosis not present

## 2015-12-17 DIAGNOSIS — R262 Difficulty in walking, not elsewhere classified: Secondary | ICD-10-CM | POA: Diagnosis not present

## 2015-12-18 ENCOUNTER — Telehealth: Payer: Self-pay | Admitting: Family

## 2015-12-18 NOTE — Telephone Encounter (Signed)
Patient did not complete labs ordered after his visit. Would you please have pt arrange lab visit to complete?

## 2015-12-18 NOTE — Assessment & Plan Note (Addendum)
Uncontrolled. Obtain uric acid level.  Begin short course of prednisone.  Advised pt to check sugars bid while on prednisone and call if sugar > 300.

## 2015-12-18 NOTE — Assessment & Plan Note (Signed)
Clinically stable, obtain follow up A1C.  Continue current meds.

## 2015-12-18 NOTE — Assessment & Plan Note (Signed)
Stable on current meds, continue same.  

## 2015-12-18 NOTE — Assessment & Plan Note (Signed)
BP a little low today, pt is asymptomatic. Will continue current meds. Monitor.

## 2015-12-19 NOTE — Progress Notes (Signed)
Patient rescheduled his appointment.

## 2015-12-20 ENCOUNTER — Telehealth: Payer: Self-pay | Admitting: *Deleted

## 2015-12-20 ENCOUNTER — Other Ambulatory Visit (INDEPENDENT_AMBULATORY_CARE_PROVIDER_SITE_OTHER): Payer: Medicare Other

## 2015-12-20 DIAGNOSIS — E1142 Type 2 diabetes mellitus with diabetic polyneuropathy: Secondary | ICD-10-CM | POA: Diagnosis not present

## 2015-12-20 DIAGNOSIS — D649 Anemia, unspecified: Secondary | ICD-10-CM

## 2015-12-20 DIAGNOSIS — M109 Gout, unspecified: Secondary | ICD-10-CM

## 2015-12-20 LAB — HEMOGLOBIN A1C: HEMOGLOBIN A1C: 6.8 % — AB (ref 4.6–6.5)

## 2015-12-20 LAB — BASIC METABOLIC PANEL
BUN: 27 mg/dL — ABNORMAL HIGH (ref 6–23)
CHLORIDE: 100 meq/L (ref 96–112)
CO2: 27 meq/L (ref 19–32)
CREATININE: 1.21 mg/dL (ref 0.40–1.50)
Calcium: 9.4 mg/dL (ref 8.4–10.5)
GFR: 77.56 mL/min (ref >60.00)
Glucose, Bld: 169 mg/dL — ABNORMAL HIGH (ref 70–99)
POTASSIUM: 4.9 meq/L (ref 3.5–5.1)
SODIUM: 134 meq/L — AB (ref 135–145)

## 2015-12-20 LAB — URIC ACID: URIC ACID, SERUM: 10.4 mg/dL — AB (ref 4.0–7.8)

## 2015-12-20 MED ORDER — GLUCOSE BLOOD VI STRP: ORAL_STRIP | Status: DC

## 2015-12-20 MED ORDER — ONETOUCH DELICA LANCETS 33G MISC: Status: DC

## 2015-12-20 NOTE — Telephone Encounter (Signed)
Received [facility] from [facility] stating they received an Rx for [place]Ne touch meter but no rx fro test strips or lancets. [facility]

## 2015-12-20 NOTE — Telephone Encounter (Signed)
Spoke with pt and scheduled lab visit for today at 1:45pm. Future orders are already in the system.

## 2015-12-21 LAB — CBC WITH DIFFERENTIAL/PLATELET
Basophils Absolute: 0 10*3/uL (ref 0.0–0.1)
Basophils Relative: 0.3 % (ref 0.0–3.0)
EOS PCT: 1 % (ref 0.0–5.0)
Eosinophils Absolute: 0.1 10*3/uL (ref 0.0–0.7)
HCT: 41.3 % (ref 39.0–52.0)
Hemoglobin: 13.9 g/dL (ref 13.0–17.0)
LYMPHS ABS: 1.8 10*3/uL (ref 0.7–4.0)
LYMPHS PCT: 26 % (ref 12.0–46.0)
MCHC: 33.6 g/dL (ref 30.0–36.0)
MCV: 99.5 fl (ref 78.0–100.0)
MONO ABS: 0.2 10*3/uL (ref 0.1–1.0)
MONOS PCT: 2.6 % — AB (ref 3.0–12.0)
NEUTROS PCT: 70.1 % (ref 43.0–77.0)
Neutro Abs: 5 10*3/uL (ref 1.4–7.7)
PLATELETS: 130 10*3/uL — AB (ref 150.0–400.0)
RBC: 4.15 Mil/uL — AB (ref 4.22–5.81)
RDW: 15 % (ref 11.5–15.5)
WBC: 7.1 10*3/uL (ref 4.0–10.5)

## 2015-12-22 ENCOUNTER — Telehealth: Payer: Self-pay | Admitting: Family

## 2015-12-22 MED ORDER — ALLOPURINOL 300 MG PO TABS: 300.0000 mg | ORAL_TABLET | Freq: Every day | ORAL | Status: DC

## 2015-12-22 NOTE — Telephone Encounter (Signed)
Uric acid level is high. Increase allopurinol to 300mg  once daily. Sugar is at goal.  Repeat uric acid level in 1 month.

## 2015-12-23 NOTE — Telephone Encounter (Signed)
Noted.  Allopurinol does not come in caps.

## 2015-12-23 NOTE — Telephone Encounter (Signed)
Notified pt and he voices understanding. Has f/u with PCP on 01/31/16 and will repeat uric acid level at that time. Pt requests capsule form of all meds when possible.

## 2015-12-28 ENCOUNTER — Telehealth: Payer: Self-pay | Admitting: Family

## 2015-12-28 NOTE — Telephone Encounter (Signed)
Pt called in to request a Blood sugar meter. Pt says that the current meter that he has isn't working. Pt would like to have a Rx called in to pharmacy: [facility] [address], [place] - [address]

## 2015-12-29 ENCOUNTER — Ambulatory Visit: Payer: Medicare Other | Admitting: Podiatry

## 2015-12-29 ENCOUNTER — Telehealth: Payer: Self-pay | Admitting: Family

## 2015-12-29 ENCOUNTER — Ambulatory Visit: Payer: Medicare Other | Admitting: Family

## 2015-12-29 DIAGNOSIS — Z0289 Encounter for other administrative examinations: Secondary | ICD-10-CM

## 2015-12-29 NOTE — Telephone Encounter (Signed)
Spoke with Victorino Dike at [facility]. She states they already replaced pt's glucometer because he told them it was not working. They [facility] 12/10/[facility] [address] and told pt to take current meter to his pharmacy to assess meter function. Spoke with [facility], she is unsure if pt brought previous meter to them for instruction or verification of proper use. She verified that he picked up new glucometer from 12/10/15. Pt needs to come in for nurse visit for glucometer teaching / verification of use.

## 2015-12-30 ENCOUNTER — Encounter: Payer: Self-pay | Admitting: Family

## 2015-12-30 NOTE — Telephone Encounter (Signed)
Yes please

## 2015-12-30 NOTE — Telephone Encounter (Signed)
Spoke to the patient and he would like a new [facility] [address]. States that the new one does not work and he has not been able to check his BS for a couple of days on the new medication.

## 2015-12-30 NOTE — Telephone Encounter (Signed)
Pt was no show 12/29/15 for f/u appt, pt has not rescheduled, 5th no show w/in 12 months, charge or no charge?

## 2015-12-30 NOTE — Telephone Encounter (Signed)
Marked to charge and mailing no show letter °

## 2015-12-31 NOTE — Telephone Encounter (Signed)
Per below message pt has already received 2 new meters and he has reports that both were not working. Pt needs to come in for nurse visit or take meter to local pharmacy to verify problem with meter. Attempted to reach pt and voice mailbox is full, no answer at home #. Will try again later.

## 2016-01-03 ENCOUNTER — Ambulatory Visit (INDEPENDENT_AMBULATORY_CARE_PROVIDER_SITE_OTHER): Payer: Medicare Other | Admitting: Family

## 2016-01-03 ENCOUNTER — Telehealth: Payer: Self-pay | Admitting: Family

## 2016-01-03 ENCOUNTER — Encounter: Payer: Self-pay | Admitting: Family

## 2016-01-03 VITALS — BP 120/62 | HR 62 | Temp 97.9°F | Resp 18 | Ht 72.5 in | Wt 260.2 lb

## 2016-01-03 DIAGNOSIS — W19XXXA Unspecified fall, initial encounter: Secondary | ICD-10-CM

## 2016-01-03 DIAGNOSIS — R9431 Abnormal electrocardiogram [ECG] [EKG]: Secondary | ICD-10-CM

## 2016-01-03 DIAGNOSIS — S90821A Blister (nonthermal), right foot, initial encounter: Secondary | ICD-10-CM | POA: Diagnosis not present

## 2016-01-03 MED ORDER — CEPHALEXIN 500 MG PO CAPS: 500.0000 mg | ORAL_CAPSULE | Freq: Four times a day (QID) | ORAL | 0 refills | Status: DC

## 2016-01-03 NOTE — Patient Instructions (Addendum)
Please begin keflex for your blisters on your feet.   You will be contacted about your referral to podiatry for your foot blisters.  Make sure to drink plenty of water. You will be contacted about your referral to the cardiologist (to evaluate your abnormal EKG), your referral to the foot Doctor, and about your MRI of your spine.

## 2016-01-03 NOTE — Telephone Encounter (Signed)
Please let pt know that I gave further consideration to his medications and I would like him to stop toprol xl which he is taking for blood pressure. Follow up with RN in 1 week for BP recheck.

## 2016-01-03 NOTE — Telephone Encounter (Signed)
Pt in office for appointment today. Does not have meter with him but was notified that he can bring meter to [place] or take to local pharmacy to test for meter accuracy and operating status and they can let him know what his next steps should be. Pt voices understanding.

## 2016-01-03 NOTE — Progress Notes (Signed)
Pre visit review using our clinic review tool, if applicable. No additional management support is needed unless otherwise documented below in the visit note. 

## 2016-01-03 NOTE — Progress Notes (Signed)
Subjective:    Patient ID: [name] [name], male    DOB: Nov 17, 1950, 65 y.o.   MRN: [id number]  HPI  [name] [name] is a 65 yr old male who presents today for follow up.  1) Falls- notes intermittent episodes of falling.  Denies LOC. Reports that it has occurred 3-4 times in the last year. Reports that he just "drops" without warning.  Reports that one of the episodes was hypogylcemia.  Reports that another time his blood pressure was too low.  Denies associated bowel/bladder incontinence, LOC or witnessed seizure activity. Does have some associated dizziness at times. Denies LE numbness/weakness. Does report ongoing low back pain.    Has question re: kidneys. Lab Results  Component Value Date   CREATININE 1.21 12/20/2015    Review of Systems     Past Medical History:  Diagnosis Date  . Anemia    hx of blood transfusion  . Chronic kidney disease    acute renal failure in February admission  . Complication of anesthesia    difficulty waking up  . Depression   . Diabetes mellitus    not consistent on taking medications  . Gout   . History of blood clots 1986 or 87   blood clot in groin?  . History of headache   . History of hepatitis C    competed treatment  . Hyperlipidemia   . Hypertension    Takes prinzide daily  . Neuromuscular disorder (HCC)    gout and neuropathy  . Peripheral vascular disease ([place])   . Varicose veins   . Vein, varicose   . Wound check, dressing change    dressing changes 3x/week; open wound     Social History   Social History  . Marital status: Single    Spouse name: N/A  . Number of children: 5  . Years of education: N/A   Occupational History  . Not on file.   Social History Main Topics  . Smoking status: Former Smoker    Packs/day: 0.25    Years: 35.00    Types: Cigars, Cigarettes    Quit date: 12/07/2015  . Smokeless tobacco: Never Used     Comment: 3 cigars a day  . Alcohol use No  . Drug use: No  . Sexual activity: Not on  file   Other Topics Concern  . Not on file   Social History Narrative   Regular exercise:  Walks a little every day.   Lives with 52 yr old son.   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   Quit smoking few months ago.   Goes to [facility] [facility]- drug free since 1994.   Former Etoh abuse.    Past Surgical History:  Procedure Laterality Date  . ABDOMINAL AORTAGRAM N/A 11/17/2011   Procedure: ABDOMINAL AORTAGRAM;  Surgeon: Rosetta Posner, MD;  [facility];  Service: Cardiovascular;  Laterality: N/A;  . FEMORAL-POPLITEAL BYPASS GRAFT  11/24/2011   Procedure: BYPASS GRAFT FEMORAL-POPLITEAL ARTERY;  Surgeon: Rosetta Posner, MD;  Location: [place];  Service: Vascular;  Laterality: Right;  . HEMORRHOID SURGERY    . VARICOSE VEIN SURGERY    . VASCULAR SURGERY  1980s   left leg    Family History  Problem Relation Age of Onset  . Cancer Mother     liver?  Marland Kitchen Hyperlipidemia Father   . Hypertension Father   . Diabetes Father   . Alcohol abuse Father   .  Arthritis Father   . Kidney disease Father   . Diabetes Son     Allergies  Allergen Reactions  . Bactrim [Sulfamethoxazole-Trimethoprim] Other (See Comments)    Blisters  . Lisinopril     Hyperkalemia   . Metformin And Related     Skin rash  . Percocet [Oxycodone-Acetaminophen] Nausea And Vomiting  . Pork-Derived Products   . Acetaminophen Nausea And Vomiting    Current Outpatient Prescriptions on File Prior to Visit  Medication Sig Dispense Refill  . Alcohol Swabs (B-D SINGLE USE SWABS REGULAR) PADS Use to check blood sugar once a day. Dx E11.91 100 each 1  . allopurinol (ZYLOPRIM) 300 MG tablet Take 1 tablet (300 mg total) by mouth daily. 30 tablet 6  . amitriptyline (ELAVIL) 25 MG tablet TAKE 2-3 TABLETS BY MOUTH DAILY AT BEDTIME AS DIRECTED. KEEP SCHEDULED APPOINTMENT 90 tablet 2  . aspirin EC 81 MG tablet Take 81 mg by mouth daily.    Marland Kitchen atorvastatin (LIPITOR) 40 MG tablet Take 1 tablet (40  mg total) by mouth daily. 90 tablet 0  . betamethasone dipropionate (DIPROLENE) 0.05 % cream Apply topically 2 times daily (Patient taking differently: Apply 1 application topically daily. Apply topically 2 times daily) 30 g 0  . Blood Glucose Monitoring Suppl (ONE TOUCH ULTRA 2) w/Device KIT USE TO CHECK BLOOD SUGAR ONCE A DAY. DX E11.9 1 each 0  . colchicine 0.6 MG tablet IF GOUT FLARE TAKE 2 TABLETS FOLLOWED BY 1 TABLET 1 HOUR LATER. THEN RESUME ONE TABLET DAILY. 120 tablet 1  . Ferrous Sulfate (IRON) 325 (65 FE) MG TABS Take 1 tablet by mouth daily. 30 each 2  . furosemide (LASIX) 20 MG tablet TAKE ONE TABLET BY MOUTH ONCE DAILY 90 tablet 0  . gabapentin (NEURONTIN) 800 MG tablet Take 1 tablet (800 mg total) by mouth 3 (three) times daily. 90 tablet 0  . glucose blood test strip ONE TOUCH ULTRA. Use as instructed to check blood sugar once a day.  DX.. E11.9 100 each 1  . hydrochlorothiazide (HYDRODIURIL) 12.5 MG tablet Take 12.5 mg by mouth daily.    Marland Kitchen JANUVIA 100 MG tablet TAKE ONE TABLET BY MOUTH ONCE DAILY 90 tablet 1  . lidocaine (XYLOCAINE) 5 % ointment APPLY 1 GRAM TOPICALLY TO THE AFFECTED AREA FOUR TIMES DAILY 35.44 g 1  . losartan (COZAAR) 25 MG tablet TAKE ONE TABLET BY MOUTH ONCE DAILY. NO FUTHER REFILLS WITHOUT APPOINTMENT 90 tablet 0  . metFORMIN (GLUMETZA) 1000 MG (MOD) 24 hr tablet Take 1,000 mg by mouth daily.    . methadone (DOLOPHINE) 10 MG tablet Take 10 mg by mouth 3 (three) times daily.    . metoprolol succinate (TOPROL-XL) 25 MG 24 hr tablet TAKE ONE TABLET BY MOUTH ONCE DAILY. NO FURTHER REFILLS WITHOUT APPOINTMENT 90 tablet 0  . mupirocin cream (BACTROBAN) 2 % Apply 1 application topically 2 (two) times daily. 45 g 1  . ONE TOUCH LANCETS MISC As directed    . ONETOUCH DELICA LANCETS 65H MISC Use to check blood sugar once a day.  Dx  E11.9 100 each 1  . Oxycodone HCl 20 MG TABS Take 1 tablet (20 mg total) by mouth 4 (four) times daily as needed (for pain). Take 1/2 - 1  tablet (10-78m) by mouth 2-4 times daily as needed for pain 30 tablet 0  . pregabalin (LYRICA) 300 MG capsule Take 1 capsule (300 mg total) by mouth 2 (two) times daily. 60 capsule 0  .  vitamin B-12 (CYANOCOBALAMIN) 1000 MCG tablet Take 1,000 mcg by mouth daily.    . predniSONE (DELTASONE) 10 MG tablet 4 tabs once daily for 2 days, then 3 tabs daily x 2 days, then 2 tabs daily x 2 days, then 1 tab x 2 days (Patient not taking: Reported on 01/03/2016) 20 tablet 0   No current facility-administered medications on file prior to visit.     BP 120/62   Pulse 62   Temp 97.9 F (36.6 C) (Oral)   Resp 18   Ht 6' 0.5" (1.842 m)   Wt 260 lb 3.2 oz (118 kg)   SpO2 96% Comment: room air  BMI 34.80 kg/m    Objective:   Physical Exam  Constitutional: He is oriented to person, place, and time. He appears well-developed and well-nourished. No distress.  HENT:  Head: Normocephalic and atraumatic.  Cardiovascular: Normal rate and regular rhythm.   No murmur heard. Pulmonary/Chest: Effort normal and breath sounds normal. No respiratory distress. He has no wheezes. He has no rales.  Neurological: He is alert and oriented to person, place, and time.  Skin: Skin is warm and dry.  Multiple blisters noted on sole of right foot, very dry skin is noted. (left foot not examined because pt did not wish to remove compression hose, but pt reports no blisters on left foot).   Psychiatric: He has a normal mood and affect. His behavior is normal. Thought content normal.          Assessment & Plan:  Foot blisters- new. Rx with empiric keflex and will refer to podiatry for further evaluation.  Falls- mild orthostasis SBP drops from 106 to 92.  EKG is performed and personally reviewed.  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  This is not new compared to previous EKG from 2016. Will d/c toprol xl. Continue lasix due to LE edema.   He is requesting MRI of his spine to assess DDD due to back  pain.  This is reasonable.   Discussed with pt that his renal function is still WNL.

## 2016-01-04 NOTE — Telephone Encounter (Signed)
Notified pt and scheduled nurse visit for 01/11/16 at 2:15pm.

## 2016-01-07 ENCOUNTER — Other Ambulatory Visit: Payer: Self-pay | Admitting: Family

## 2016-01-07 DIAGNOSIS — Z1389 Encounter for screening for other disorder: Secondary | ICD-10-CM

## 2016-01-08 ENCOUNTER — Ambulatory Visit (HOSPITAL_BASED_OUTPATIENT_CLINIC_OR_DEPARTMENT_OTHER)
Admission: RE | Admit: 2016-01-08 | Discharge: 2016-01-08 | Disposition: A | Discharge status: Home / Self Care | Payer: Medicare Other | Source: Ambulatory Visit | Attending: Family | Admitting: Family

## 2016-01-08 DIAGNOSIS — M5136 Other intervertebral disc degeneration, lumbar region: Secondary | ICD-10-CM | POA: Diagnosis not present

## 2016-01-08 DIAGNOSIS — M4806 Spinal stenosis, lumbar region: Secondary | ICD-10-CM | POA: Insufficient documentation

## 2016-01-08 DIAGNOSIS — Z1389 Encounter for screening for other disorder: Secondary | ICD-10-CM | POA: Diagnosis not present

## 2016-01-08 DIAGNOSIS — M47816 Spondylosis without myelopathy or radiculopathy, lumbar region: Secondary | ICD-10-CM | POA: Diagnosis not present

## 2016-01-08 DIAGNOSIS — W19XXXA Unspecified fall, initial encounter: Secondary | ICD-10-CM | POA: Insufficient documentation

## 2016-01-08 DIAGNOSIS — M5126 Other intervertebral disc displacement, lumbar region: Secondary | ICD-10-CM | POA: Diagnosis not present

## 2016-01-08 DIAGNOSIS — M545 Low back pain: Secondary | ICD-10-CM | POA: Diagnosis present

## 2016-01-09 ENCOUNTER — Telehealth: Payer: Self-pay | Admitting: Family

## 2016-01-09 DIAGNOSIS — M545 Low back pain: Secondary | ICD-10-CM

## 2016-01-09 NOTE — Telephone Encounter (Signed)
Please let pt know that I reviewed his MRI. It notes some arthritis and bulging discs with nerve compression.  I will refer him to neurosurgery for further evaluation.

## 2016-01-10 ENCOUNTER — Other Ambulatory Visit: Payer: Self-pay | Admitting: Family

## 2016-01-10 NOTE — Telephone Encounter (Signed)
Melissa-- please advise 90 day supply request on pt's Lyrica?

## 2016-01-10 NOTE — Telephone Encounter (Signed)
30 days at a time only. See rx. Tks.

## 2016-01-10 NOTE — Telephone Encounter (Signed)
Left message for pt to return my call.

## 2016-01-11 NOTE — Telephone Encounter (Signed)
Spoke with pt, he states he uses [facility] because they do pill packs for all of the pt's medications.  [facility].

## 2016-01-11 NOTE — Telephone Encounter (Signed)
Left message for pt to return my call. Since we are only doing a 30 day supply at a time we need to ask pt if he would prefer to get it from his local pharmacy instead of mail order.

## 2016-01-11 NOTE — Telephone Encounter (Signed)
Pt returned CMA 's call.

## 2016-01-11 NOTE — Telephone Encounter (Signed)
Notified pt and he is agreeable to proceed with referral. 

## 2016-01-12 ENCOUNTER — Ambulatory Visit: Payer: Medicare Other | Admitting: Podiatry

## 2016-01-18 DIAGNOSIS — E1142 Type 2 diabetes mellitus with diabetic polyneuropathy: Secondary | ICD-10-CM | POA: Diagnosis not present

## 2016-01-18 DIAGNOSIS — Z79891 Long term (current) use of opiate analgesic: Secondary | ICD-10-CM | POA: Diagnosis not present

## 2016-01-18 DIAGNOSIS — R262 Difficulty in walking, not elsewhere classified: Secondary | ICD-10-CM | POA: Diagnosis not present

## 2016-01-18 DIAGNOSIS — M545 Low back pain: Secondary | ICD-10-CM | POA: Diagnosis not present

## 2016-01-18 DIAGNOSIS — M1A9XX0 Chronic gout, unspecified, without tophus (tophi): Secondary | ICD-10-CM | POA: Diagnosis not present

## 2016-01-24 ENCOUNTER — Telehealth: Payer: Self-pay | Admitting: Family

## 2016-01-24 NOTE — Telephone Encounter (Signed)
°  Relationship to patient: Self Can be reached:[phone]   Reason for call: Request call back about MRI results

## 2016-01-24 NOTE — Telephone Encounter (Signed)
Patient received a call from the Neurosurgeon, I gave him the number [phone] to give them a call for the apt.     KP

## 2016-01-27 ENCOUNTER — Telehealth: Payer: Self-pay | Admitting: Family

## 2016-01-27 DIAGNOSIS — M5136 Other intervertebral disc degeneration, lumbar region: Secondary | ICD-10-CM | POA: Diagnosis not present

## 2016-01-27 DIAGNOSIS — M4806 Spinal stenosis, lumbar region: Secondary | ICD-10-CM | POA: Diagnosis not present

## 2016-01-27 NOTE — Telephone Encounter (Signed)
Caller name: Joni Reiningicole Dr, Wynetta Emeryram Relation to pt: Neuro Surgeon  Call back [phone]    Reason for call:  Requesting verbal orders for Dr. Donalee CitrinGary Cram regarding who's in charge of patient diabities and requesting verbal clearance for patient to have an epidural.

## 2016-01-28 NOTE — Telephone Encounter (Signed)
Spoke with [name] [name]. She states pt will receive the below medications via epidural. They are aware this will be addressed when PCP returns next week.  Benzocaine (lidocaine) Omnipaqe (contrast dye) Triamcinolone ?(sp)  Please advise if ok for pt to proceed?

## 2016-01-28 NOTE — Telephone Encounter (Signed)
Left detailed message on [name]'s voicemail that [name] [name] is managing pt's diabetes but she will need to know what medication pt will be getting in his epidural before she can give authorization to proceed. Awaiting call back.

## 2016-01-31 ENCOUNTER — Encounter: Payer: Self-pay | Admitting: Family

## 2016-01-31 ENCOUNTER — Ambulatory Visit (INDEPENDENT_AMBULATORY_CARE_PROVIDER_SITE_OTHER): Payer: Medicare Other | Admitting: Family

## 2016-01-31 VITALS — BP 99/61 | HR 74 | Temp 98.3°F | Resp 18 | Ht 75.0 in | Wt 258.8 lb

## 2016-01-31 DIAGNOSIS — E118 Type 2 diabetes mellitus with unspecified complications: Secondary | ICD-10-CM | POA: Diagnosis not present

## 2016-01-31 DIAGNOSIS — B352 Tinea manuum: Secondary | ICD-10-CM

## 2016-01-31 DIAGNOSIS — I959 Hypotension, unspecified: Secondary | ICD-10-CM

## 2016-01-31 DIAGNOSIS — B353 Tinea pedis: Secondary | ICD-10-CM | POA: Diagnosis not present

## 2016-01-31 MED ORDER — TERBINAFINE HCL 1 % EX CREA: 1.0000 "application " | TOPICAL_CREAM | Freq: Two times a day (BID) | CUTANEOUS | 2 refills | Status: DC

## 2016-01-31 NOTE — Patient Instructions (Addendum)
Please complete lab work prior to leaving. Do not take metformin for 48 hours after taking contrast.   Stop Metoprolol (toprol xl) if you have not already.   Stop HCTZ.  Continue lasix, (furosemide). Complete lab work prior to leaving.

## 2016-01-31 NOTE — Telephone Encounter (Signed)
Per verbal from PCP, if creatinine level is normal from today's labs then pt can proceed with below procedure.

## 2016-01-31 NOTE — Progress Notes (Signed)
Subjective:    Patient ID: [name] [name], male    DOB: Nov 15, 1950, 65 y.o.   MRN: [id number]  HPI   [name] [name] is a 65 yr old male who presents today for follow up.   Foot blisters- was referred to podiatry.  Orthostatic hypotension- He is not certain that he stopped the metoprolol.   BP Readings from Last 3 Encounters:  01/31/16 99/61  01/03/16 120/62  12/15/15 (!) 100/58   Low back pain- MRI of the spine noted mod/severe spondylosis of the lumbar spine, L4-5 mod/severe central canal stenosis with severe bilateral foraminal narrowing L?R.  He is following with Dr. Saintclair Halsted and will have a procedure performed this week.   Lab Results  Component Value Date   CREATININE 1.21 12/20/2015    Review of Systems See HPI  Past Medical History:  Diagnosis Date  . Anemia    hx of blood transfusion  . Chronic kidney disease    acute renal failure in February admission  . Complication of anesthesia    difficulty waking up  . Depression   . Diabetes mellitus    not consistent on taking medications  . Gout   . History of blood clots 1986 or 87   blood clot in groin?  . History of headache   . History of hepatitis C    competed treatment  . Hyperlipidemia   . Hypertension    Takes prinzide daily  . Neuromuscular disorder (HCC)    gout and neuropathy  . Peripheral vascular disease ([place])   . Varicose veins   . Vein, varicose   . Wound check, dressing change    dressing changes 3x/week; open wound     Social History   Social History  . Marital status: Single    Spouse name: N/A  . Number of children: 5  . Years of education: N/A   Occupational History  . Not on file.   Social History Main Topics  . Smoking status: Former Smoker    Packs/day: 0.25    Years: 35.00    Types: Cigars, Cigarettes    Quit date: 12/07/2015  . Smokeless tobacco: Never Used     Comment: 3 cigars a day  . Alcohol use No  . Drug use: No  . Sexual activity: Not on file   Other Topics Concern   . Not on file   Social History Narrative   Regular exercise:  Walks a little every day.   Lives with 28 yr old son.   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   Quit smoking few months ago.   Goes to [employer]- drug free since 1994.   Former Etoh abuse.    Past Surgical History:  Procedure Laterality Date  . ABDOMINAL AORTAGRAM N/A 11/17/2011   Procedure: ABDOMINAL AORTAGRAM;  Surgeon: Rosetta Posner, MD;  Location: [facility] CATH LAB;  Service: Cardiovascular;  Laterality: N/A;  . FEMORAL-POPLITEAL BYPASS GRAFT  11/24/2011   Procedure: BYPASS GRAFT FEMORAL-POPLITEAL ARTERY;  Surgeon: Rosetta Posner, MD;  Location: [place];  Service: Vascular;  Laterality: Right;  . HEMORRHOID SURGERY    . VARICOSE VEIN SURGERY    . VASCULAR SURGERY  1980s   left leg    Family History  Problem Relation Age of Onset  . Cancer Mother     liver?  Marland Kitchen Hyperlipidemia Father   . Hypertension Father   . Diabetes Father   . Alcohol abuse Father   .  Arthritis Father   . Kidney disease Father   . Diabetes Son     Allergies  Allergen Reactions  . Bactrim [Sulfamethoxazole-Trimethoprim] Other (See Comments)    Blisters  . Lisinopril     Hyperkalemia   . Metformin And Related     Skin rash  . Percocet [Oxycodone-Acetaminophen] Nausea And Vomiting  . Pork-Derived Products   . Acetaminophen Nausea And Vomiting    Current Outpatient Prescriptions on File Prior to Visit  Medication Sig Dispense Refill  . Alcohol Swabs (B-D SINGLE USE SWABS REGULAR) PADS Use to check blood sugar once a day. Dx E11.91 100 each 1  . allopurinol (ZYLOPRIM) 300 MG tablet Take 1 tablet (300 mg total) by mouth daily. 30 tablet 6  . amitriptyline (ELAVIL) 25 MG tablet TAKE 2-3 TABLETS BY MOUTH DAILY AT BEDTIME AS DIRECTED. KEEP SCHEDULED APPOINTMENT 90 tablet 2  . aspirin EC 81 MG tablet Take 81 mg by mouth daily.    Marland Kitchen atorvastatin (LIPITOR) 40 MG tablet Take 1 tablet (40 mg total) by mouth daily. 90  tablet 0  . betamethasone dipropionate (DIPROLENE) 0.05 % cream Apply topically 2 times daily (Patient taking differently: Apply 1 application topically daily. Apply topically 2 times daily) 30 g 0  . Blood Glucose Monitoring Suppl (ONE TOUCH ULTRA 2) w/Device KIT USE TO CHECK BLOOD SUGAR ONCE A DAY. DX E11.9 1 each 0  . colchicine 0.6 MG tablet IF GOUT FLARE TAKE 2 TABLETS FOLLOWED BY 1 TABLET 1 HOUR LATER. THEN RESUME ONE TABLET DAILY. 120 tablet 1  . Ferrous Sulfate (IRON) 325 (65 FE) MG TABS Take 1 tablet by mouth daily. 30 each 2  . furosemide (LASIX) 20 MG tablet TAKE ONE TABLET BY MOUTH ONCE DAILY 90 tablet 0  . gabapentin (NEURONTIN) 800 MG tablet TAKE ONE TABLET BY MOUTH 3 TIMES DAILY(AM,NOON,EVE) 270 tablet 1  . glucose blood test strip ONE TOUCH ULTRA. Use as instructed to check blood sugar once a day.  DX.. E11.9 100 each 1  . hydrochlorothiazide (HYDRODIURIL) 12.5 MG tablet Take 12.5 mg by mouth daily.    Marland Kitchen JANUVIA 100 MG tablet TAKE ONE TABLET BY MOUTH ONCE DAILY 90 tablet 1  . lidocaine (XYLOCAINE) 5 % ointment APPLY 1 GRAM TOPICALLY TO THE AFFECTED AREA FOUR TIMES DAILY 35.44 g 1  . losartan (COZAAR) 25 MG tablet TAKE ONE TABLET BY MOUTH ONCE DAILY. NO FUTHER REFILLS WITHOUT APPOINTMENT 90 tablet 0  . LYRICA 300 MG capsule TAKE ONE CAPSULE BY MOUTH TWICE A DAY 60 capsule 0  . metFORMIN (GLUMETZA) 1000 MG (MOD) 24 hr tablet Take 1,000 mg by mouth daily.    . methadone (DOLOPHINE) 10 MG tablet Take 10 mg by mouth 3 (three) times daily.    . mupirocin cream (BACTROBAN) 2 % Apply 1 application topically 2 (two) times daily. 45 g 1  . ONE TOUCH LANCETS MISC As directed    . ONETOUCH DELICA LANCETS 09N MISC Use to check blood sugar once a day.  Dx  E11.9 100 each 1  . Oxycodone HCl 20 MG TABS Take 1 tablet (20 mg total) by mouth 4 (four) times daily as needed (for pain). Take 1/2 - 1 tablet (10-'20mg'$ ) by mouth 2-4 times daily as needed for pain 30 tablet 0  . predniSONE (DELTASONE) 10 MG  tablet 4 tabs once daily for 2 days, then 3 tabs daily x 2 days, then 2 tabs daily x 2 days, then 1 tab x 2 days 20  tablet 0  . vitamin B-12 (CYANOCOBALAMIN) 1000 MCG tablet Take 1,000 mcg by mouth daily.     No current facility-administered medications on file prior to visit.     BP 99/61 (BP Location: Right Arm, Patient Position: Sitting, Cuff Size: Normal)   Pulse 74   Temp 98.3 F (36.8 C) (Oral)   Resp 18   Ht '6\' 3"'$  (1.905 m)   Wt 258 lb 12.8 oz (117.4 kg)   SpO2 95%   BMI 32.35 kg/m       Objective:   Physical Exam  Constitutional: He is oriented to person, place, and time. He appears well-developed and well-nourished. No distress.  Drowsy appearing  HENT:  Head: Normocephalic and atraumatic.  Cardiovascular: Normal rate and regular rhythm.   No murmur heard. Pulmonary/Chest: Effort normal and breath sounds normal. No respiratory distress. He has no wheezes. He has no rales.  Musculoskeletal: He exhibits no edema.  Neurological: He is alert and oriented to person, place, and time.  Skin: Skin is warm and dry.  Peeling skin noted on hands/feet  Psychiatric: He has a normal mood and affect. His behavior is normal. Thought content normal.          Assessment & Plan:  Tinea pedis/tinea manus- recommended otc topical lamisil bid.    Orthostatic hypotension- advised pt to d/c metoprolol if he has not already done so. Will also d/c hctz 12.'5mg'$  and continue current dose of lasix.    Low back pain- management per Dr. Saintclair Halsted.  Will be having a epidural procedure with contrast.   Lab Results  Component Value Date   CREATININE 1.21 12/20/2015   Advised pt to hold metformin x 48 hours following contrast administration.

## 2016-01-31 NOTE — Progress Notes (Signed)
Pre visit review using our clinic review tool, if applicable. No additional management support is needed unless otherwise documented below in the visit note. 

## 2016-02-02 ENCOUNTER — Encounter: Payer: Medicare Other | Admitting: Podiatry

## 2016-02-02 ENCOUNTER — Encounter: Payer: Self-pay | Admitting: Family

## 2016-02-02 ENCOUNTER — Other Ambulatory Visit (INDEPENDENT_AMBULATORY_CARE_PROVIDER_SITE_OTHER): Payer: Medicare Other

## 2016-02-02 DIAGNOSIS — E118 Type 2 diabetes mellitus with unspecified complications: Secondary | ICD-10-CM

## 2016-02-02 LAB — BASIC METABOLIC PANEL
BUN: 14 mg/dL (ref 6–23)
CO2: 27 mEq/L (ref 19–32)
Calcium: 8.5 mg/dL (ref 8.4–10.5)
Chloride: 108 mEq/L (ref 96–112)
Creatinine, Ser: 1.09 mg/dL (ref 0.40–1.50)
GFR: 87.46 mL/min (ref >60.00)
GLUCOSE: 115 mg/dL — AB (ref 70–99)
POTASSIUM: 4.6 meq/L (ref 3.5–5.1)
SODIUM: 138 meq/L (ref 135–145)

## 2016-02-03 ENCOUNTER — Encounter: Payer: Self-pay | Admitting: *Deleted

## 2016-02-03 NOTE — Progress Notes (Signed)
Left without being seen. Had an emergency.

## 2016-02-04 NOTE — Telephone Encounter (Signed)
Creatinine level from 02/02/16 normal, notified Joni ReiningNicole with Dr Lonie Peakram's office.

## 2016-02-08 ENCOUNTER — Encounter: Payer: Self-pay | Admitting: Podiatry

## 2016-02-08 ENCOUNTER — Ambulatory Visit (INDEPENDENT_AMBULATORY_CARE_PROVIDER_SITE_OTHER): Payer: Medicare Other | Admitting: Podiatry

## 2016-02-08 DIAGNOSIS — M79676 Pain in unspecified toe(s): Secondary | ICD-10-CM | POA: Diagnosis not present

## 2016-02-08 DIAGNOSIS — L84 Corns and callosities: Secondary | ICD-10-CM | POA: Diagnosis not present

## 2016-02-08 DIAGNOSIS — B351 Tinea unguium: Secondary | ICD-10-CM | POA: Diagnosis not present

## 2016-02-08 DIAGNOSIS — E1149 Type 2 diabetes mellitus with other diabetic neurological complication: Secondary | ICD-10-CM | POA: Diagnosis not present

## 2016-02-08 DIAGNOSIS — Z87898 Personal history of other specified conditions: Secondary | ICD-10-CM

## 2016-02-08 NOTE — Progress Notes (Signed)
Subjective: 65 y.o. returns the office today for painful, elongated, thickened toenails which he cannot trim himself. Denies any redness or drainage around the nails. He is also requesting diabetic shoes today. Denies any acute changes since last appointment and no new complaints today. Denies any systemic complaints such as fevers, chills, nausea, vomiting.   He has a history of a ulcer on the right foot and after the wound healed he started to develop a callus to the area which is painful at times. No drainage, redness, swelling.   Objective: AAO 3, NAD DP/PT pulses palpable, CRT less than 3 seconds Protective sensation decreased with Simms Weinstein monofilament, Achilles tendon reflex intact.  Nails hypertrophic, dystrophic, elongated, brittle, discolored 10. There is tenderness overlying the nails 1-5 bilaterally. There is no surrounding erythema or drainage along the nail sites. Hyperkeratotic lesion right lateral foot. Upon debridement no underlying ulceration, drainage or other signs of infection. No open lesions are identified today. Dry skin plantar feet bilaterally. No open lesions or pre-ulcerative lesions are identified. No other areas of tenderness bilateral lower extremities. No overlying edema, erythema, increased warmth. No pain with calf compression, swelling, warmth, erythema.  Assessment: Patient presents with symptomatic onychomycosis; pre-ulcerative callus  Plan: -Treatment options including alternatives, risks, complications were discussed -Nails sharply debrided 10 without complication/bleeding. -Hyperkeratotic lesion debrided 1 without complications or bleeding. -Paper approximately today for precertification diabetic shoes. -Discussed daily foot inspection. If there are any changes, to call the office immediately.  -Follow-up in 3 months or sooner if any problems are to arise. In the meantime, encouraged to call the office with any questions, concerns, changes  symptoms.  [name] CurdMatthew Wagoner, DPM

## 2016-02-14 ENCOUNTER — Other Ambulatory Visit: Payer: Self-pay | Admitting: Family

## 2016-02-16 DIAGNOSIS — M1A9XX0 Chronic gout, unspecified, without tophus (tophi): Secondary | ICD-10-CM | POA: Diagnosis not present

## 2016-02-16 DIAGNOSIS — M545 Low back pain: Secondary | ICD-10-CM | POA: Diagnosis not present

## 2016-02-16 DIAGNOSIS — E1142 Type 2 diabetes mellitus with diabetic polyneuropathy: Secondary | ICD-10-CM | POA: Diagnosis not present

## 2016-02-16 DIAGNOSIS — R262 Difficulty in walking, not elsewhere classified: Secondary | ICD-10-CM | POA: Diagnosis not present

## 2016-02-20 NOTE — Progress Notes (Signed)
Cardiology Office Note   Date:  02/21/2016   ID:  [name] [name], DOB 1950-08-05, MRN [id number]  PCP:  Nance Pear., NP    No chief complaint on file. abnormal ECG   Wt Readings from Last 3 Encounters:  02/21/16 256 lb (116.1 kg)  01/31/16 258 lb 12.8 oz (117.4 kg)  01/03/16 260 lb 3.2 oz (118 kg)       History of Present Illness: [name] [name] is a 65 y.o. male  with hypertension, hepatitis C, diabetes and PAD who presents for evaluation after an abnormal ECG.    He had a left anterior fascicular block.  No chest pain or SHOB.  He walks up stairs without problems.  His biggest issues is gout.  He just had a flare and this limited his walking.  He is going to go to a gym and start being more active.    He continues to cut back on tobacco use.  He uses gum, lozenges to reduce tobacco consumption.       Past Medical History:  Diagnosis Date  . Anemia    hx of blood transfusion  . Chronic kidney disease    acute renal failure in February admission  . Complication of anesthesia    difficulty waking up  . Depression   . Diabetes mellitus    not consistent on taking medications  . Gout   . History of blood clots 1986 or 87   blood clot in groin?  . History of headache   . History of hepatitis C    competed treatment  . Hyperlipidemia   . Hypertension    Takes prinzide daily  . Neuromuscular disorder (HCC)    gout and neuropathy  . Peripheral vascular disease ([place])   . Varicose veins   . Vein, varicose   . Wound check, dressing change    dressing changes 3x/week; open wound    Past Surgical History:  Procedure Laterality Date  . ABDOMINAL AORTAGRAM N/A 11/17/2011   Procedure: ABDOMINAL AORTAGRAM;  Surgeon: Rosetta Posner, MD;  Location: [facility] CATH LAB;  Service: Cardiovascular;  Laterality: N/A;  . FEMORAL-POPLITEAL BYPASS GRAFT  11/24/2011   Procedure: BYPASS GRAFT FEMORAL-POPLITEAL ARTERY;  Surgeon: Rosetta Posner, MD;  Location: [place];  Service:  Vascular;  Laterality: Right;  . HEMORRHOID SURGERY    . VARICOSE VEIN SURGERY    . VASCULAR SURGERY  1980s   left leg     Current Outpatient Prescriptions  Medication Sig Dispense Refill  . Alcohol Swabs (B-D SINGLE USE SWABS REGULAR) PADS Use to check blood sugar once a day. Dx E11.91 100 each 1  . allopurinol (ZYLOPRIM) 100 MG tablet     . amitriptyline (ELAVIL) 25 MG tablet TAKE 2-3 TABLETS BY MOUTH DAILY AT BEDTIME AS DIRECTED. KEEP SCHEDULED APPOINTMENT 90 tablet 2  . atorvastatin (LIPITOR) 40 MG tablet Take 1 tablet (40 mg total) by mouth daily. 90 tablet 0  . betamethasone dipropionate (DIPROLENE) 0.05 % cream Apply 1 application topically 2 (two) times daily.    . Blood Glucose Monitoring Suppl (ONE TOUCH ULTRA 2) w/Device KIT USE TO CHECK BLOOD SUGAR ONCE A DAY. DX E11.9 1 each 0  . colchicine 0.6 MG tablet IF GOUT FLARE TAKE 2 TABLETS FOLLOWED BY 1 TABLET 1 HOUR LATER. THEN RESUME ONE TABLET DAILY. 120 tablet 1  . Ferrous Sulfate (IRON) 325 (65 FE) MG TABS Take 1 tablet by mouth daily. 30 each 2  . furosemide (  LASIX) 20 MG tablet TAKE ONE TABLET BY MOUTH ONCE DAILY 90 tablet 0  . gabapentin (NEURONTIN) 800 MG tablet TAKE ONE TABLET BY MOUTH 3 TIMES DAILY(AM,NOON,EVE) 270 tablet 1  . glucose blood test strip ONE TOUCH ULTRA. Use as instructed to check blood sugar once a day.  DX.. E11.9 100 each 1  . JANUVIA 100 MG tablet TAKE ONE TABLET BY MOUTH ONCE DAILY 90 tablet 1  . lidocaine (XYLOCAINE) 5 % ointment APPLY 1 GRAM TOPICALLY TO THE AFFECTED AREA FOUR TIMES DAILY 35.44 g 1  . losartan (COZAAR) 25 MG tablet TAKE ONE TABLET BY MOUTH ONCE DAILY. NO FUTHER REFILLS WITHOUT APPOINTMENT 90 tablet 0  . LYRICA 300 MG capsule TAKE ONE CAPSULE BY MOUTH TWICE A DAY 60 capsule 0  . metFORMIN (GLUMETZA) 1000 MG (MOD) 24 hr tablet Take 1,000 mg by mouth daily.    . methadone (DOLOPHINE) 10 MG tablet Take 10 mg by mouth 3 (three) times daily.    . mupirocin cream (BACTROBAN) 2 % Apply 1  application topically 2 (two) times daily. 45 g 1  . ONE TOUCH LANCETS MISC As directed    . ONETOUCH DELICA LANCETS 82X MISC Use to check blood sugar once a day.  Dx  E11.9 100 each 1  . Oxycodone HCl 20 MG TABS Take 1 tablet (20 mg total) by mouth 4 (four) times daily as needed (for pain). Take 1/2 - 1 tablet (10-'20mg'$ ) by mouth 2-4 times daily as needed for pain 30 tablet 0  . terbinafine (LAMISIL AT) 1 % cream Apply 1 application topically 2 (two) times daily. 30 g 2  . vitamin B-12 (CYANOCOBALAMIN) 1000 MCG tablet Take 1,000 mcg by mouth daily.     No current facility-administered medications for this visit.     Allergies:   Pork-derived products; Bactrim [sulfamethoxazole-trimethoprim]; Lisinopril; Percocet [oxycodone-acetaminophen]; Acetaminophen; and Metformin and related    Social History:  The patient  reports that he quit smoking about 2 months ago. His smoking use included Cigars and Cigarettes. He has a 8.75 pack-year smoking history. He has never used smokeless tobacco. He reports that he does not drink alcohol or use drugs.   Family History:  The patient's family history includes Alcohol abuse in his father; Arthritis in his father; Cancer in his mother; Diabetes in his father and son; Hyperlipidemia in his father; Hypertension in his father; Kidney disease in his father.    ROS:  Please see the history of present illness.   Otherwise, review of systems are positive for foot pain from gout.   All other systems are reviewed and negative.    PHYSICAL EXAM: VS:  BP 112/60   Pulse 74   Ht '6\' 1"'$  (1.854 m)   Wt 256 lb (116.1 kg)   BMI 33.78 kg/m  , BMI Body mass index is 33.78 kg/m. GEN: Well nourished, well developed, in no acute distress  HEENT: normal  Neck: no JVD, carotid bruits, or masses Cardiac: RRR; no murmurs, rubs, or gallops,no edema  Respiratory:  clear to auscultation bilaterally, normal work of breathing GI: soft, nontender, nondistended, + BS MS: no  deformity or atrophy  Skin: warm and dry, no rash Neuro:  Strength and sensation are intact Psych: euthymic mood, full affect   EKG:   The ekg ordered today demonstrates NSR, LAFB   Recent Labs: 03/01/2015: TSH 1.53 03/24/2015: ALT 37 12/20/2015: Hemoglobin 13.9; Platelets 130.0 02/02/2016: BUN 14; Creatinine, Ser 1.09; Potassium 4.6; Sodium 138   Lipid  Panel    Component Value Date/Time   CHOL 112 08/13/2015 1102   TRIG 188.0 (H) 08/13/2015 1102   HDL 24.30 (L) 08/13/2015 1102   CHOLHDL 5 08/13/2015 1102   VLDL 37.6 08/13/2015 1102   LDLCALC 50 08/13/2015 1102   LDLDIRECT 118.0 11/30/2014 1045     Other studies Reviewed: Additional studies/ records that were reviewed today with results demonstrating: .   ASSESSMENT AND PLAN:  1. 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 No relation to ischemia. It will not cause any symptoms. Given his lack of ischemic symptoms, would not pursue any further workup. 2. Peripheral arterial disease: This does put him at higher risk for coronary artery disease. Continue aggressive secondary prevention including regular exercise, blood pressure control and lipid lowering therapy. Continue atorvastatin with target LDL less than 100. 3. Hypertension: Blood pressure well controlled. Continue ARB. 4. Given his risk factors for CAD and previous peripheral arterial disease, I will see the patient back in a year. 5. Tobacco abuse:  I strongly recommended that he stop smoking.   Current medicines are reviewed at length with the patient today.  The patient concerns regarding his medicines were addressed.  The following changes have been made:  No change  Labs/ tests ordered today include:  No orders of the defined types were placed in this encounter.   Recommend 150 minutes/week of aerobic exercise Low fat, low carb, high fiber diet recommended  Disposition:   FU in 1 year   Signed, Larae Grooms, MD  02/21/2016  11:58 AM    [facility] [address], [place], [place]  [address] Phone: [phone]; Fax: [phone]

## 2016-02-21 ENCOUNTER — Ambulatory Visit (INDEPENDENT_AMBULATORY_CARE_PROVIDER_SITE_OTHER): Payer: Medicare Other | Admitting: Interventional Cardiology

## 2016-02-21 ENCOUNTER — Encounter: Payer: Self-pay | Admitting: Interventional Cardiology

## 2016-02-21 VITALS — BP 112/60 | HR 74 | Ht 73.0 in | Wt 256.0 lb

## 2016-02-21 DIAGNOSIS — E785 Hyperlipidemia, unspecified: Secondary | ICD-10-CM | POA: Diagnosis not present

## 2016-02-21 DIAGNOSIS — I739 Peripheral vascular disease, unspecified: Secondary | ICD-10-CM

## 2016-02-21 DIAGNOSIS — Z720 Tobacco use: Secondary | ICD-10-CM | POA: Diagnosis not present

## 2016-02-21 DIAGNOSIS — I10 Essential (primary) hypertension: Secondary | ICD-10-CM | POA: Diagnosis not present

## 2016-02-21 DIAGNOSIS — R9431 Abnormal electrocardiogram [ECG] [EKG]: Secondary | ICD-10-CM

## 2016-02-21 DIAGNOSIS — I444 Left anterior fascicular block: Secondary | ICD-10-CM

## 2016-02-21 NOTE — Patient Instructions (Signed)
Medication Instructions:  Same-no changes  Labwork: None  Testing/Procedures: None  Follow-Up: In 1 year     If you need a refill on your cardiac medications before your next appointment, please call your pharmacy.   

## 2016-03-14 ENCOUNTER — Other Ambulatory Visit: Payer: Self-pay | Admitting: Family Medicine

## 2016-03-14 ENCOUNTER — Other Ambulatory Visit: Payer: Self-pay | Admitting: Family

## 2016-03-15 DIAGNOSIS — E1142 Type 2 diabetes mellitus with diabetic polyneuropathy: Secondary | ICD-10-CM | POA: Diagnosis not present

## 2016-03-15 DIAGNOSIS — M545 Low back pain: Secondary | ICD-10-CM | POA: Diagnosis not present

## 2016-03-15 DIAGNOSIS — R262 Difficulty in walking, not elsewhere classified: Secondary | ICD-10-CM | POA: Diagnosis not present

## 2016-03-15 DIAGNOSIS — M1A9XX0 Chronic gout, unspecified, without tophus (tophi): Secondary | ICD-10-CM | POA: Diagnosis not present

## 2016-03-24 ENCOUNTER — Telehealth: Payer: Self-pay | Admitting: Family

## 2016-03-24 NOTE — Telephone Encounter (Signed)
Pt called in, he says that he is switching manufacturers because they are out of his medications. Pt would like to have a refill on all of his medications. Pt says that there are to many to go down the list, so he wouldn't name them all. Pt says that he would like to have [facility].

## 2016-03-27 NOTE — Telephone Encounter (Signed)
Caller name: Relationship to patient: Self Can be reached: [phone] Pharmacy:  [facility] [place] - [address] [phone] (Phone) [phone])     Reason for call:  Patient called this morning to follow up on the request for his medications. Patient states he is out of all of his meds and would like for them to be sent to [facility] until he can change suppliers.  Lipitor Elavil Metformin Losartan Lasix Glucose Monitor/Test Strips/Lancets Januvia

## 2016-03-28 MED ORDER — METFORMIN HCL ER (MOD) 1000 MG PO TB24: 1000.0000 mg | ORAL_TABLET | Freq: Every day | ORAL | 1 refills | Status: DC

## 2016-03-28 MED ORDER — FUROSEMIDE 20 MG PO TABS: 20.0000 mg | ORAL_TABLET | Freq: Every day | ORAL | 1 refills | Status: DC

## 2016-03-28 MED ORDER — ONETOUCH ULTRA 2 W/DEVICE KIT: PACK | 0 refills | Status: AC

## 2016-03-28 MED ORDER — ONETOUCH DELICA LANCETS 33G MISC: 1 refills | Status: AC

## 2016-03-28 MED ORDER — LOSARTAN POTASSIUM 25 MG PO TABS: ORAL_TABLET | ORAL | 1 refills | Status: AC

## 2016-03-28 MED ORDER — AMITRIPTYLINE HCL 25 MG PO TABS: ORAL_TABLET | ORAL | 5 refills | Status: AC

## 2016-03-28 MED ORDER — ATORVASTATIN CALCIUM 40 MG PO TABS: 40.0000 mg | ORAL_TABLET | Freq: Every day | ORAL | 1 refills | Status: AC

## 2016-03-28 MED ORDER — SITAGLIPTIN PHOSPHATE 100 MG PO TABS: 100.0000 mg | ORAL_TABLET | Freq: Every day | ORAL | 1 refills | Status: DC

## 2016-03-28 MED ORDER — GLUCOSE BLOOD VI STRP: ORAL_STRIP | 1 refills | Status: AC

## 2016-03-28 NOTE — Telephone Encounter (Signed)
Patient called regarding this message. He would like a return call. Please advise.    Phone: [phone]

## 2016-03-28 NOTE — Telephone Encounter (Signed)
Spoke with [facility] regarding refill of amitriptyline and atorvastatin sent by Dr Abner GreenspanBlyth on 03/14/16. Pharmacist states that Rxs were placed on hold and have not been sent to pt yet. Advised him to reverse / cancel Rxs as pt is requesting refill go to a different pharmacy.  [facility]. Notified pt. He states that he is going to be changing to [facility] soon. Advised him to have [facility] contact [facility] to transfer any remaining refills when it is time. Pt voices understanding.

## 2016-03-31 ENCOUNTER — Telehealth: Payer: Self-pay | Admitting: *Deleted

## 2016-03-31 MED FILL — AMITRIPTYLINE HCL 25 MG TAB: 25 | 30 days supply | Qty: 90 | Fill #0

## 2016-03-31 MED FILL — LOSARTAN POTASSIUM 25 MG TA: 25 | 90 days supply | Qty: 90 | Fill #0

## 2016-03-31 MED FILL — COLCHICINE 0.6 MG TABLET: 0.6 | 83 days supply | Qty: 100 | Fill #1

## 2016-03-31 MED FILL — ATORVASTATIN 40 MG TABLET: 40 | 90 days supply | Qty: 90 | Fill #0

## 2016-03-31 MED FILL — JANUVIA 100 MG TABLET: 100 | 90 days supply | Qty: 90 | Fill #0

## 2016-03-31 MED FILL — ONE TOUCH ULTRA 2 GLUCOSE S: W/DEVICE | 1 days supply | Qty: 1 | Fill #0

## 2016-03-31 MED FILL — ONE TOUCH DELICA 33G LANCET: 90 days supply | Qty: 100 | Fill #0

## 2016-03-31 MED FILL — ONE TOUCH ULTRA TEST STRIPS: 90 days supply | Qty: 100 | Fill #0

## 2016-03-31 MED FILL — FUROSEMIDE 20 MG TABLET: 20 | 90 days supply | Qty: 90 | Fill #0

## 2016-03-31 NOTE — Telephone Encounter (Signed)
Received request via covermymeds for Metformin ER 1000mg  once a day via covermymeds. Received status oif N/A. Spoke with Romeo AppleBen in the pharmacy and he states it still will not go through insurance. States insurance will cover gluophage XR 500mg  twice a day.  Please advise?

## 2016-04-01 NOTE — Telephone Encounter (Signed)
OK to send metformin ER 500mg  2 tabs PO daily.

## 2016-04-03 ENCOUNTER — Telehealth: Payer: Self-pay | Admitting: Family

## 2016-04-03 MED ORDER — METFORMIN HCL ER 500 MG PO TB24: 500.0000 mg | ORAL_TABLET | Freq: Every day | ORAL | 1 refills | Status: DC

## 2016-04-03 MED ORDER — METFORMIN HCL ER 500 MG PO TB24: 1000.0000 mg | ORAL_TABLET | Freq: Every day | ORAL | 1 refills | Status: DC

## 2016-04-03 NOTE — Telephone Encounter (Signed)
New rx sent and detailed message left on pt's voicemail and to call if any questions.

## 2016-04-03 NOTE — Telephone Encounter (Signed)
Spoke with [name] [name] and advised her that metformin is supposed to be 500mg , 2 tablets every day.

## 2016-04-03 NOTE — Telephone Encounter (Signed)
Caller name: Romeo AppleBen Relationship to patient: Pharmacy Can be reached: [phone] Pharmacy:  Reason for call: Need to verify order for Metformin. Is it suppose to be 500 or 1000 mgs?

## 2016-04-06 ENCOUNTER — Other Ambulatory Visit: Payer: Self-pay | Admitting: Family

## 2016-04-06 NOTE — Telephone Encounter (Signed)
Please advise. TL/CMA 

## 2016-04-06 NOTE — Telephone Encounter (Signed)
Relation to UJ:WJXBpt:self Call back [phone] Pharmacy: [facility] [place] - [address] [phone] (Phone) [phone])     Reason for call:  Patient requesting a refill LYRICA 300 MG capsule. Patient states he feels great when would you like him to follow up, please advise.

## 2016-04-07 NOTE — Telephone Encounter (Signed)
I would recommend that he request refills from his Pain Management Doctor in [place] who is managing his other pain medications.

## 2016-04-07 NOTE — Telephone Encounter (Signed)
Left messge for patient to call the office back PC

## 2016-04-10 NOTE — Telephone Encounter (Signed)
Notified pt. Pt was not happy and wanted to know what would happen if Dr Archie PattenMonroe would not prescribe his lyrica. Advised him to let us know if he runs into any difficulty but to check with him first since he is pt's pain management specialist.  Pt disconnected the call.

## 2016-04-12 ENCOUNTER — Telehealth: Payer: Self-pay | Admitting: *Deleted

## 2016-04-12 NOTE — Telephone Encounter (Signed)
Ok to send gabapentin and colchicine refills to [facility]. Thanks.

## 2016-04-12 NOTE — Telephone Encounter (Signed)
Attempted to reach [facility] and was unable to get representative.  Needs to notify them of below requests / outcome:  Losartan, Januvia: [facility]16 and [facility] needs to transfer Rxs from them.  HCTZ:  D/c'd by Provider and pt is no longer on medication Prednisone:  Not currently being prescribed by PCP  Melissa please advise below rxs: Gabapentin: Last Rx went to [facility] and will need new Rx to [facility] Colchicine: Last Rx went to [facility] and will need new Rx to [facility].

## 2016-04-13 ENCOUNTER — Other Ambulatory Visit: Payer: Self-pay

## 2016-04-13 MED ORDER — FUROSEMIDE 20 MG PO TABS: 20.0000 mg | ORAL_TABLET | Freq: Every day | ORAL | 1 refills | Status: AC

## 2016-04-13 MED ORDER — COLCHICINE 0.6 MG PO TABS: ORAL_TABLET | ORAL | 1 refills | Status: DC

## 2016-04-13 MED ORDER — METFORMIN HCL ER 500 MG PO TB24: 1000.0000 mg | ORAL_TABLET | Freq: Every day | ORAL | 1 refills | Status: AC

## 2016-04-13 MED ORDER — METOPROLOL SUCCINATE ER 25 MG PO TB24: 25.0000 mg | ORAL_TABLET | Freq: Every day | ORAL | 3 refills | Status: AC

## 2016-04-13 MED ORDER — GABAPENTIN 800 MG PO TABS: ORAL_TABLET | ORAL | 1 refills | Status: DC

## 2016-04-13 MED ORDER — PREGABALIN 300 MG PO CAPS: 300.0000 mg | ORAL_CAPSULE | Freq: Two times a day (BID) | ORAL | 0 refills | Status: AC

## 2016-04-14 ENCOUNTER — Telehealth: Payer: Self-pay | Admitting: Family

## 2016-04-14 DIAGNOSIS — M545 Low back pain: Secondary | ICD-10-CM | POA: Diagnosis not present

## 2016-04-14 DIAGNOSIS — E1142 Type 2 diabetes mellitus with diabetic polyneuropathy: Secondary | ICD-10-CM | POA: Diagnosis not present

## 2016-04-14 DIAGNOSIS — M1A9XX0 Chronic gout, unspecified, without tophus (tophi): Secondary | ICD-10-CM | POA: Diagnosis not present

## 2016-04-14 DIAGNOSIS — R262 Difficulty in walking, not elsewhere classified: Secondary | ICD-10-CM | POA: Diagnosis not present

## 2016-04-14 NOTE — Telephone Encounter (Signed)
Looks like all meds were filled except for HCTZ and prednisone as they are not on patient's med list. As such I will not fill. Will be deferred to PCP.  I am not sure why he is on both Gabapentin and Lyrica. Should only be taking one. Please call patient to assess.

## 2016-04-14 NOTE — Telephone Encounter (Signed)
Caller name: Relationship to patient: Can be reached: Pharmacy:  [facility] - [place], [place] - [address] [phone] (Phone) [phone])     Reason for call: Pharmacy request refills on the following medications. States they did not get all of the ones they requested.  sitaGLIPtin (JANUVIA) 100 MG tablet [[id number]]  pregabalin (LYRICA) 300 MG capsule [[id number]][[id number]] was not received amitriptyline (ELAVIL) 25 MG tablet [[id number]][[id number]] losartan (COZAAR) 25 MG tablet [[id number]][[id number]]  atorvastatin (LIPITOR) 40 MG tablet [[id number]][[id number]] Hydrochlorothiazide (HYDRODIURIL) 12.5 MG tablet ([id number]) predniSONE (DELTASONE) 10 MG tablet ([id number])

## 2016-04-15 NOTE — Telephone Encounter (Signed)
I stopped hctz when we started him on lasix.  Also, I do not believe he should be on prednisone.  Has he been taking and if so, who has been prescribing it?  OK to continue lyrica and gabapentin.  I see that Dr. Carmelia RollerWendling did refill his gabapentin this month.  In the future, I would like his pain doctor to refill his lyrica.

## 2016-04-17 ENCOUNTER — Other Ambulatory Visit: Payer: Self-pay

## 2016-04-17 MED ORDER — COLCHICINE 0.6 MG PO TABS: ORAL_TABLET | ORAL | 1 refills | Status: AC

## 2016-04-17 MED ORDER — GABAPENTIN 800 MG PO TABS: ORAL_TABLET | ORAL | 1 refills | Status: AC

## 2016-04-17 NOTE — Telephone Encounter (Signed)
See 04/14/16 phone note as well.

## 2016-04-17 NOTE — Telephone Encounter (Signed)
He should schedule OV if he develops gout flare.

## 2016-04-17 NOTE — Telephone Encounter (Signed)
Notified [name] of below denials and to reach out to [facility] to transfer all other Rxs that are needed. Pt states he is not currently taking Prednisone but likes to keep a prednisone dose pack on hand in case of gout flare ups. States gout flare up last summer "nearly killed him because he couldn't get in the office to be seen due to the severe pain in his feet from his gout". Please advise?

## 2016-04-18 NOTE — Telephone Encounter (Signed)
Attempted to notify pt and mailbox is full.

## 2016-04-19 NOTE — Telephone Encounter (Signed)
Attempted to reach pt and received message that mailbox is full. Unable to leave message.

## 2016-04-21 ENCOUNTER — Encounter: Payer: Self-pay | Admitting: Family

## 2016-04-21 NOTE — Telephone Encounter (Signed)
Notified pt. He was upset that PCP would not give him an rx to keep on hand. Pt states "she really doesn't like me or want to work with me does she". I advised him that Prednisone is not a prescription that she prescribes for any pt to keep on hand. Pt wanted to know "if he was supposed to call us after he lands in the hospital from a gout flare up". Advised him he should call the office for an appointment at the first sign of a gout flare up before hospitalization is required. Pt voices understanding and call was disconnected.

## 2016-04-21 NOTE — Telephone Encounter (Signed)
Noted  

## 2016-04-25 ENCOUNTER — Telehealth: Payer: Self-pay | Admitting: Family

## 2016-04-25 NOTE — Telephone Encounter (Signed)
Patient dismissed from [facility] by Sandford CrazeMelissa O'Sullivan NP , effective April 21, 2016. Dismissal letter sent out by certified / registered mail. DAJ

## 2016-05-01 ENCOUNTER — Ambulatory Visit (INDEPENDENT_AMBULATORY_CARE_PROVIDER_SITE_OTHER): Payer: Medicare Other

## 2016-05-01 DIAGNOSIS — B351 Tinea unguium: Secondary | ICD-10-CM

## 2016-05-01 DIAGNOSIS — E1149 Type 2 diabetes mellitus with other diabetic neurological complication: Secondary | ICD-10-CM

## 2016-05-01 NOTE — Progress Notes (Signed)
Patient presents to be measured for diabetic shoes and inserts.  We will call when shoes and inserts arrive 

## 2016-05-05 ENCOUNTER — Other Ambulatory Visit: Payer: Self-pay | Admitting: Family

## 2016-05-05 NOTE — Telephone Encounter (Signed)
[facility]Scribe [facility] from [facility] Lyrica 300 mg Last filled - 04/13/16, #60x0 [not due until 05/13/16] Last AEX - Patient was Dismissed from Practice on 04/25/16 Please Advise on refills/SLS 12/01

## 2016-05-08 ENCOUNTER — Encounter: Payer: Self-pay | Admitting: Family

## 2016-05-08 ENCOUNTER — Ambulatory Visit (INDEPENDENT_AMBULATORY_CARE_PROVIDER_SITE_OTHER): Payer: Medicare Other | Admitting: Family

## 2016-05-08 VITALS — BP 118/66 | HR 57 | Temp 98.3°F | Resp 16 | Ht 75.0 in | Wt 252.8 lb

## 2016-05-08 DIAGNOSIS — F329 Major depressive disorder, single episode, unspecified: Secondary | ICD-10-CM

## 2016-05-08 DIAGNOSIS — F32A Depression, unspecified: Secondary | ICD-10-CM

## 2016-05-08 DIAGNOSIS — L859 Epidermal thickening, unspecified: Secondary | ICD-10-CM

## 2016-05-08 NOTE — Telephone Encounter (Signed)
Received signed domestic return receipt verifying delivery of certified letter on April 28, 2016. Article number [id number] DAJ

## 2016-05-08 NOTE — Patient Instructions (Addendum)
Please contact Dr. Loreta AveWagner to arrange follow up to have the callous on your right foot   Please contact [facility] to schedule an appointment with a counselor.  [phone]   Contact a psychiatrist to schedule an appointment.   Psychiatric Services:  [facility], [address], [place], [place] [phone], [address], [place], [place] [phone] [facility] [phone] [facility], [address] [place], [place] [phone] [facility], [address], [place], [place] [phone]

## 2016-05-08 NOTE — Progress Notes (Signed)
Subjective:    Patient ID: [name] [name], male    DOB: Jul 07, 1950, 65 y.o.   MRN: [id number]  HPI  [name] [name] is a 65 yr old male who presents today with chief complaint of "growth" on his right foot. Reports that the growth has been present x "years."    Depression-  Reports that he continues to deal with the loss of his mom. The holidays are a dificult time due to the loss of his mom 16 years ago.  Denies SI.     Review of Systems    see HPI  Past Medical History:  Diagnosis Date  . Anemia    hx of blood transfusion  . Chronic kidney disease    acute renal failure in February admission  . Complication of anesthesia    difficulty waking up  . Depression   . Diabetes mellitus    not consistent on taking medications  . Gout   . History of blood clots 1986 or 87   blood clot in groin?  . History of headache   . History of hepatitis C    competed treatment  . Hyperlipidemia   . Hypertension    Takes prinzide daily  . Neuromuscular disorder (HCC)    gout and neuropathy  . Peripheral vascular disease ([place])   . Varicose veins   . Vein, varicose   . Wound check, dressing change    dressing changes 3x/week; open wound     Social History   Social History  . Marital status: Single    Spouse name: N/A  . Number of children: 5  . Years of education: N/A   Occupational History  . Not on file.   Social History Main Topics  . Smoking status: Former Smoker    Packs/day: 0.25    Years: 35.00    Types: Cigars, Cigarettes    Quit date: 12/07/2015  . Smokeless tobacco: Never Used     Comment: 3 cigars a day  . Alcohol use No  . Drug use: No  . Sexual activity: Not on file   Other Topics Concern  . Not on file   Social History Narrative   Regular exercise:  Walks a little every day.   Lives with 64 yr old son.   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   Quit smoking few months ago.   Goes to [employer]- drug free since 1994.   Former Etoh abuse.    Past Surgical History:  Procedure Laterality Date  . ABDOMINAL AORTAGRAM N/A 11/17/2011   Procedure: ABDOMINAL AORTAGRAM;  Surgeon: Rosetta Posner, MD;  Location: [facility];  Service: Cardiovascular;  Laterality: N/A;  . FEMORAL-POPLITEAL BYPASS GRAFT  11/24/2011   Procedure: BYPASS GRAFT FEMORAL-POPLITEAL ARTERY;  Surgeon: Rosetta Posner, MD;  Location: [facility];  Service: Vascular;  Laterality: Right;  . HEMORRHOID SURGERY    . VARICOSE VEIN SURGERY    . VASCULAR SURGERY  1980s   left leg    Family History  Problem Relation Age of Onset  . Cancer Mother     liver?  Marland Kitchen Hyperlipidemia Father   . Hypertension Father   . Diabetes Father   . Alcohol abuse Father   . Arthritis Father   . Kidney disease Father   . Diabetes Son   . Heart attack Neg Hx     Allergies  Allergen Reactions  . Pork-Derived Products Other (See Comments)    UNKNOWN TO  PT.  . Bactrim [Sulfamethoxazole-Trimethoprim] Other (See Comments)    Blisters  . Lisinopril     Hyperkalemia   . Percocet [Oxycodone-Acetaminophen] Nausea And Vomiting  . Acetaminophen Nausea And Vomiting  . Metformin And Related Rash    Skin rash    Current Outpatient Prescriptions on File Prior to Visit  Medication Sig Dispense Refill  . Alcohol Swabs (B-D SINGLE USE SWABS REGULAR) PADS Use to check blood sugar once a day. Dx E11.91 100 each 1  . amitriptyline (ELAVIL) 25 MG tablet TAKE 2-3 TABLETS BY MOUTH DAILY AT BEDTIME AS DIRECTED. 90 tablet 5  . atorvastatin (LIPITOR) 40 MG tablet Take 1 tablet (40 mg total) by mouth daily. 90 tablet 1  . Blood Glucose Monitoring Suppl (ONE TOUCH ULTRA 2) w/Device KIT USE TO CHECK BLOOD SUGAR ONCE A DAY. DX E11.9 1 each 0  . colchicine 0.6 MG tablet IF GOUT FLARE TAKE 2 TABLETS FOLLOWED BY 1 TABLET 1 HOUR LATER. THEN RESUME ONE TABLET DAILY. 120 tablet 1  . Ferrous Sulfate (IRON) 325 (65 FE) MG TABS Take 1 tablet by mouth daily. 30 each 2  . furosemide (LASIX) 20 MG tablet  Take 1 tablet (20 mg total) by mouth daily. 90 tablet 1  . gabapentin (NEURONTIN) 800 MG tablet TAKE ONE TABLET BY MOUTH 3 TIMES DAILY(AM,NOON,EVE) 270 tablet 1  . glucose blood test strip ONE TOUCH ULTRA. Use as instructed to check blood sugar once a day.  DX.. E11.9 100 each 1  . lidocaine (XYLOCAINE) 5 % ointment APPLY 1 GRAM TOPICALLY TO THE AFFECTED AREA FOUR TIMES DAILY 35.44 g 1  . losartan (COZAAR) 25 MG tablet TAKE ONE TABLET BY MOUTH ONCE DAILY. 90 tablet 1  . metFORMIN (GLUCOPHAGE-XR) 500 MG 24 hr tablet Take 2 tablets (1,000 mg total) by mouth daily with breakfast. 180 tablet 1  . methadone (DOLOPHINE) 10 MG tablet Take 10 mg by mouth 3 (three) times daily.    . metoprolol succinate (TOPROL-XL) 25 MG 24 hr tablet Take 1 tablet (25 mg total) by mouth daily. 90 tablet 3  . ONETOUCH DELICA LANCETS 34H MISC Use to check blood sugar once a day.  Dx  E11.9 100 each 1  . Oxycodone HCl 20 MG TABS Take 1 tablet (20 mg total) by mouth 4 (four) times daily as needed (for pain). Take 1/2 - 1 tablet (10-'20mg'$ ) by mouth 2-4 times daily as needed for pain 30 tablet 0  . pregabalin (LYRICA) 300 MG capsule Take 1 capsule (300 mg total) by mouth 2 (two) times daily. 60 capsule 0  . sitaGLIPtin (JANUVIA) 100 MG tablet Take 1 tablet (100 mg total) by mouth daily. 90 tablet 1  . vitamin B-12 (CYANOCOBALAMIN) 1000 MCG tablet Take 1,000 mcg by mouth daily.     No current facility-administered medications on file prior to visit.     Pulse 60   Temp 98.3 F (36.8 C) (Oral)   Resp 16   Ht '6\' 3"'$  (1.905 m)   Wt 252 lb 12.8 oz (114.7 kg)   SpO2 97%   BMI 31.60 kg/m    Objective:   Physical Exam  Constitutional: He is oriented to person, place, and time. He appears well-developed and well-nourished. No distress.  Musculoskeletal: He exhibits no edema.  Neurological: He is alert and oriented to person, place, and time.  Psychiatric: His behavior is normal. Judgment and thought content normal.  Slightly  flat affect.  Assessment & Plan:  Hyperkeratotic lesion Right foot- advised follow back up with podiatry for debridement. No sign of infection.  Depression- uncontrolled.  Scored 20 on PHQ-9.  Recommended that he schedule an appointment with a counselor and a psychiatrist. He is instructed to go to the ER if he develops SI. Pt verbalizes understanding.  15 min spent with pt today. >50% of this time was spent counseling patient on depression.

## 2016-05-08 NOTE — Progress Notes (Signed)
Pre visit review using our clinic review tool, if applicable. No additional management support is needed unless otherwise documented below in the visit note. 

## 2016-05-12 DIAGNOSIS — R262 Difficulty in walking, not elsewhere classified: Secondary | ICD-10-CM | POA: Diagnosis not present

## 2016-05-12 DIAGNOSIS — M545 Low back pain: Secondary | ICD-10-CM | POA: Diagnosis not present

## 2016-05-12 DIAGNOSIS — E1142 Type 2 diabetes mellitus with diabetic polyneuropathy: Secondary | ICD-10-CM | POA: Diagnosis not present

## 2016-05-12 DIAGNOSIS — M1A9XX0 Chronic gout, unspecified, without tophus (tophi): Secondary | ICD-10-CM | POA: Diagnosis not present

## 2016-05-16 ENCOUNTER — Ambulatory Visit (INDEPENDENT_AMBULATORY_CARE_PROVIDER_SITE_OTHER): Payer: Medicare Other | Admitting: Podiatry

## 2016-05-16 DIAGNOSIS — E1149 Type 2 diabetes mellitus with other diabetic neurological complication: Secondary | ICD-10-CM

## 2016-05-16 DIAGNOSIS — M79676 Pain in unspecified toe(s): Secondary | ICD-10-CM

## 2016-05-16 DIAGNOSIS — B351 Tinea unguium: Secondary | ICD-10-CM

## 2016-05-16 DIAGNOSIS — Q828 Other specified congenital malformations of skin: Secondary | ICD-10-CM

## 2016-05-16 DIAGNOSIS — L84 Corns and callosities: Secondary | ICD-10-CM

## 2016-05-16 NOTE — Progress Notes (Signed)
Subjective: 65 y.o. returns the office today for painful, elongated, thickened toenails which he cannot trim himself. Denies any redness or drainage around the nails. He also has a painful corn to the side of his left foot over an area of a previous ulcer. The area gets painful with pressure and shoes. Denies any acute changes since last appointment and no new complaints today. Denies any systemic complaints such as fevers, chills, nausea, vomiting.   Objective: AAO 3, NAD DP/PT pulses palpable, CRT less than 3 seconds Protective sensation decreased with Simms Weinstein monofilament, Achilles tendon reflex intact.  Nails hypertrophic, dystrophic, elongated, brittle, discolored 10. There is tenderness overlying the nails 1-5 bilaterally. There is no surrounding erythema or drainage along the nail sites. Thick hyperkeratotic lesion right lateral foot. Upon debridement no underlying ulceration, drainage or other signs of infection. No open lesions are identified today. Dry skin plantar feet bilaterally. To the distal portion of the 2nd toe is a pre-ulcerative area. It appears to have a small hyperkerotic lesion which looks like an old blood blister.  No open lesions or pre-ulcerative lesions are identified. No other areas of tenderness bilateral lower extremities. No overlying edema, erythema, increased warmth. No pain with calf compression, swelling, warmth, erythema.  Assessment: Patient presents with symptomatic onychomycosis; pre-ulcerative callus x 2  Plan: -Treatment options including alternatives, risks, complications were discussed -Nails sharply debrided 10 without complication/bleeding. -Hyperkeratotic lesion debrided 2 without complications or bleeding. Offloading -Discussed daily foot inspection. If there are any changes, to call the office immediately.  -Follow-up in 3 months or sooner if any problems are to arise. In the meantime, encouraged to call the office with any questions,  concerns, changes symptoms.  Ovid CurdMatthew Dorin [place]bury, DPM

## 2016-06-13 ENCOUNTER — Ambulatory Visit (INDEPENDENT_AMBULATORY_CARE_PROVIDER_SITE_OTHER): Payer: Medicare Other | Admitting: Podiatry

## 2016-06-13 ENCOUNTER — Encounter: Payer: Self-pay | Admitting: Podiatry

## 2016-06-13 DIAGNOSIS — E1149 Type 2 diabetes mellitus with other diabetic neurological complication: Secondary | ICD-10-CM

## 2016-06-13 DIAGNOSIS — Z87898 Personal history of other specified conditions: Secondary | ICD-10-CM

## 2016-06-13 DIAGNOSIS — L84 Corns and callosities: Secondary | ICD-10-CM

## 2016-06-13 NOTE — Progress Notes (Signed)
Patient presents today to [facility]. Break-in instructions were discussed. Monitor for any skin irritation. Follow-up as scheduled for routine care or sooner if needed.

## 2016-07-05 DIAGNOSIS — I878 Other specified disorders of veins: Secondary | ICD-10-CM | POA: Insufficient documentation

## 2016-08-07 IMAGING — DX DG ORBITS FOR FOREIGN BODY
2 series · 2 of 2 positions shown · non-contrast
Comparison: None.

CLINICAL DATA: Metal working/exposure; clearance prior to MRI

EXAM:
ORBITS FOR FOREIGN BODY - 2 VIEW

[orbits waters (1 of 2)]
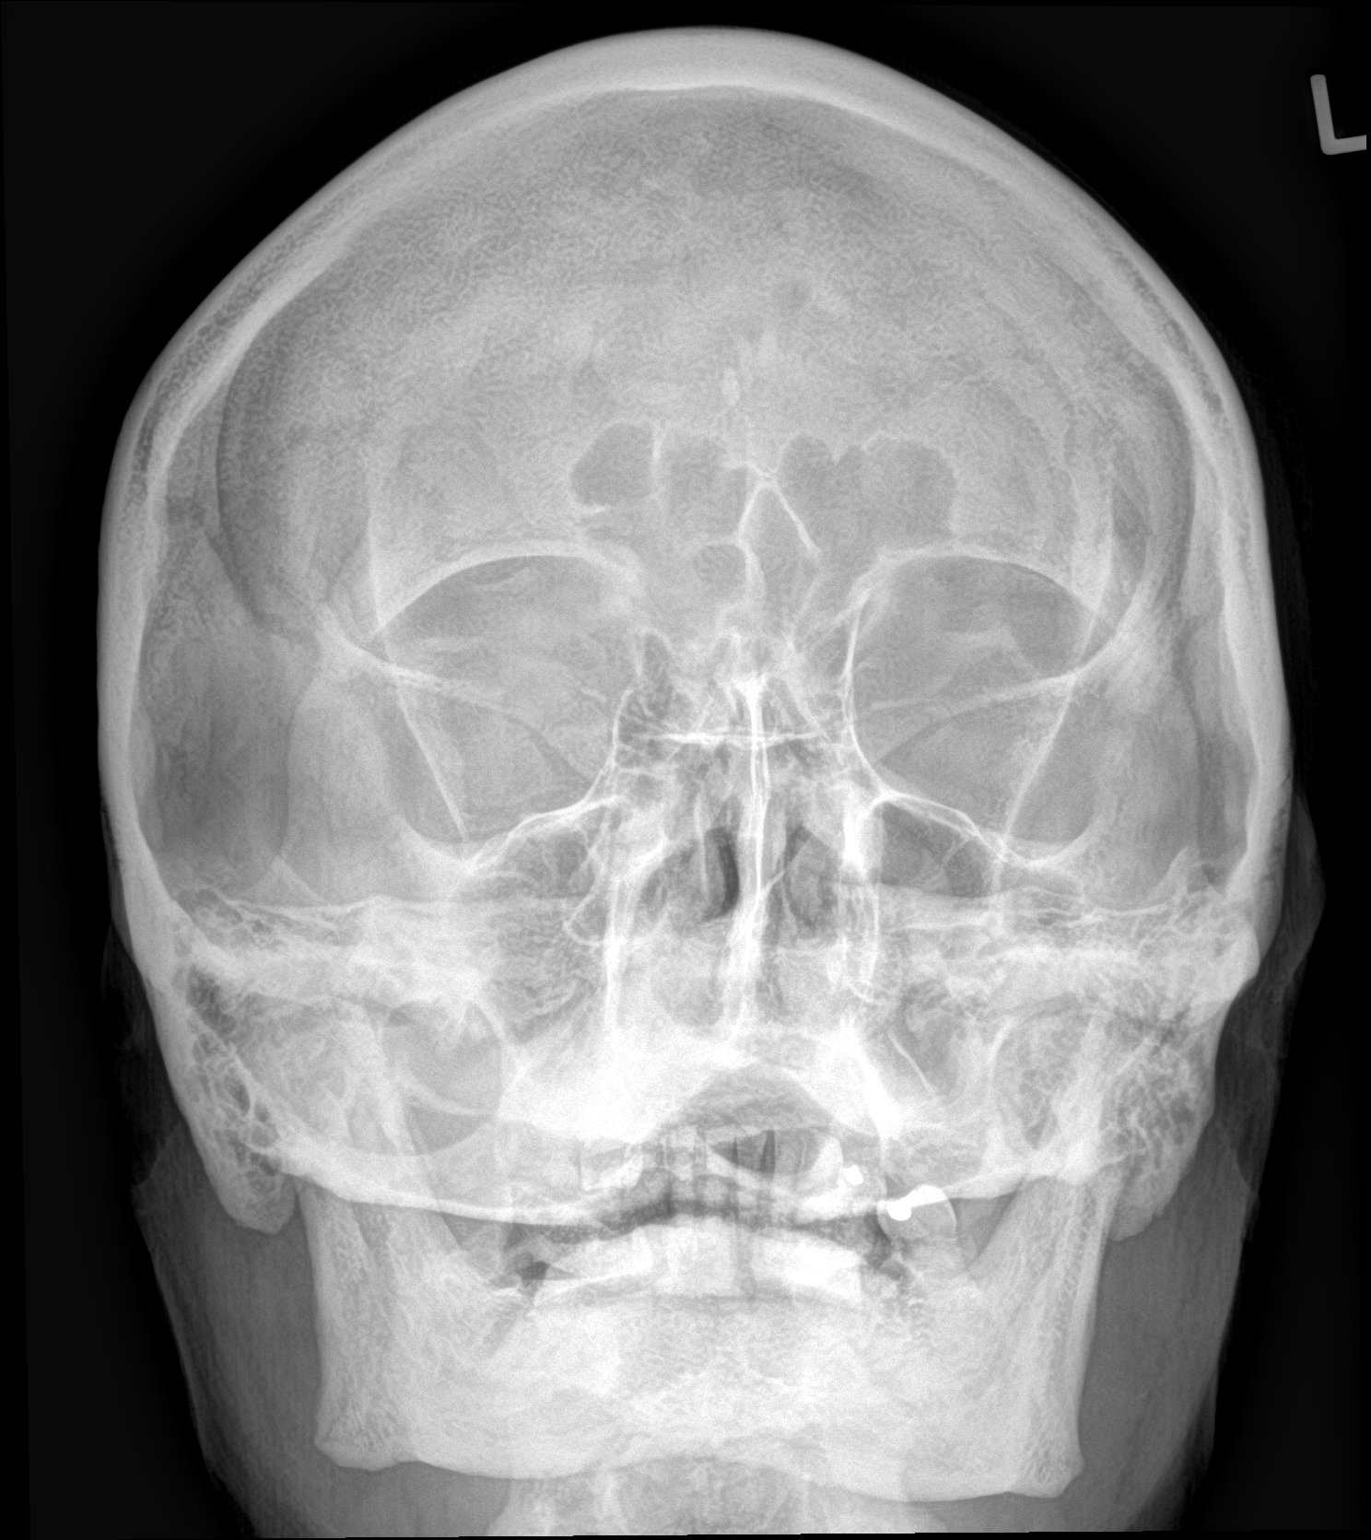

[orbits waters (2 of 2)]
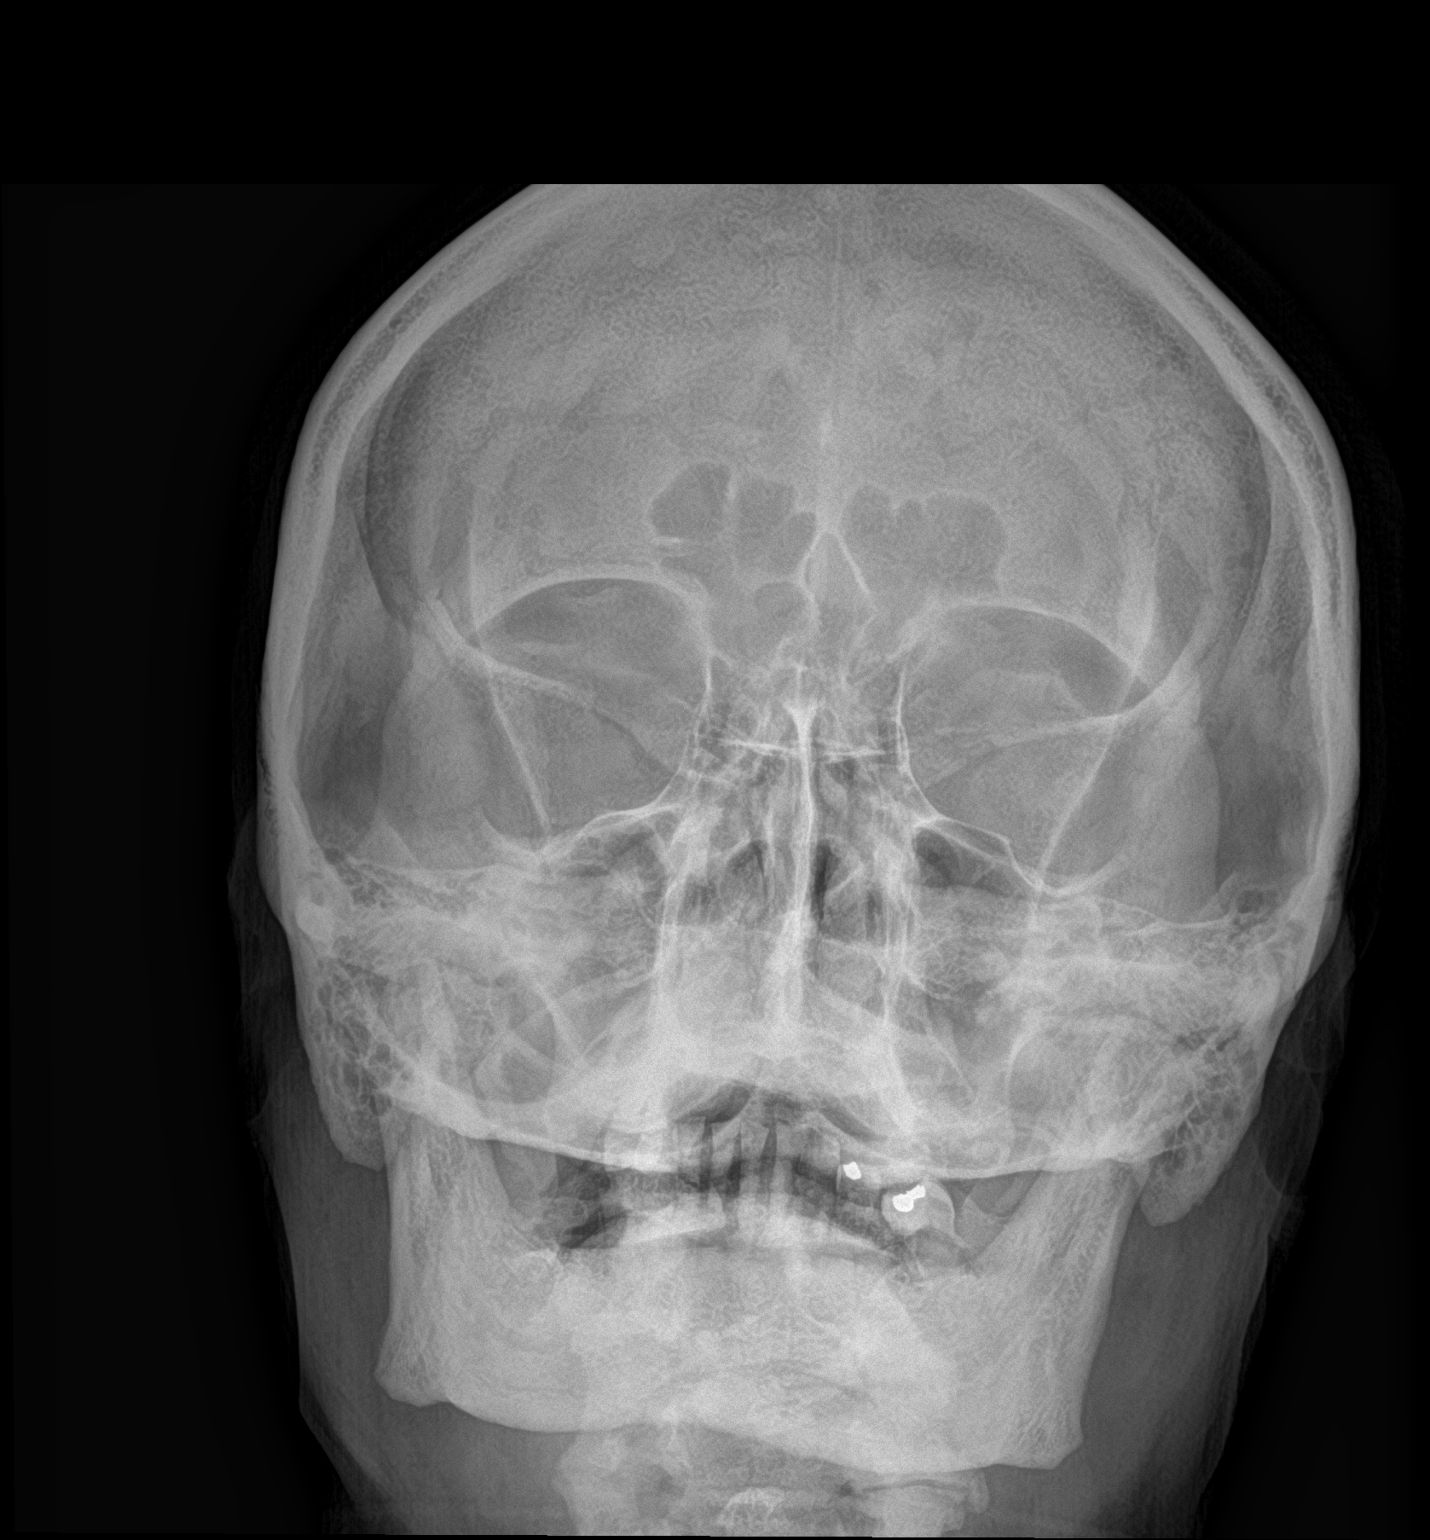

[2 of 2 positions shown; findings below may reference images not displayed]

FINDINGS: There is no evidence of metallic foreign body within the orbits. No
significant bone abnormality identified.
IMPRESSION: No evidence of metallic foreign body within the orbits.

## 2016-10-16 ENCOUNTER — Telehealth: Payer: Self-pay | Admitting: *Deleted

## 2016-10-16 NOTE — Telephone Encounter (Signed)
[facility] mg tablet Faxed Denial back to pharmacy to inform that patient is no longer under provider's care [dismissed 04/21/16]/SLS 05/14

## 2016-12-07 ENCOUNTER — Telehealth: Payer: Self-pay | Admitting: *Deleted

## 2016-12-07 NOTE — Telephone Encounter (Signed)
Received fax from [facility] Requesting refills of: gabapentin, furosemide and Metformin. Pt dismissed from practice 04/2016. Called and [facility] at [facility] and told her pt is no longer under our care and please note in their system not to send further requests us.

## 2017-01-22 DIAGNOSIS — M79673 Pain in unspecified foot: Secondary | ICD-10-CM | POA: Insufficient documentation

## 2017-02-01 DIAGNOSIS — E1140 Type 2 diabetes mellitus with diabetic neuropathy, unspecified: Secondary | ICD-10-CM | POA: Insufficient documentation

## 2017-02-08 DIAGNOSIS — F330 Major depressive disorder, recurrent, mild: Secondary | ICD-10-CM | POA: Insufficient documentation

## 2017-02-26 ENCOUNTER — Telehealth: Payer: Self-pay | Admitting: Podiatry

## 2017-02-26 NOTE — Telephone Encounter (Signed)
Returned pts call about diabetic shoes.Pt states he is due for a new pair. Left voicemail for pt that he picked them up in Jan 2018 so he would not be due for another pair until jan 2019 but we can get him scheduled so we can start the process so if he could give me a call and we can get him scheduled

## 2017-03-02 ENCOUNTER — Telehealth: Payer: Self-pay | Admitting: *Deleted

## 2017-03-02 NOTE — Telephone Encounter (Signed)
Received Provider Query from [employer] for Medical Record Clarification on Depression for Coding purposes, OV note attached; forwarded to provider/SLS 09/28   

## 2017-03-09 ENCOUNTER — Ambulatory Visit: Payer: Medicare Other | Admitting: Podiatry

## 2017-03-23 ENCOUNTER — Telehealth: Payer: Self-pay | Admitting: *Deleted

## 2017-03-23 NOTE — Telephone Encounter (Signed)
[facility] Clar[facility] on Depression for Coding purposes, OV note attached; forwarded to provider/SLS 10/19

## 2017-03-26 ENCOUNTER — Ambulatory Visit: Payer: Medicare Other | Admitting: Podiatry

## 2017-03-29 ENCOUNTER — Encounter: Payer: Self-pay | Admitting: Podiatry

## 2017-03-29 ENCOUNTER — Ambulatory Visit (INDEPENDENT_AMBULATORY_CARE_PROVIDER_SITE_OTHER): Payer: Medicare Other | Admitting: Podiatry

## 2017-03-29 DIAGNOSIS — E1149 Type 2 diabetes mellitus with other diabetic neurological complication: Secondary | ICD-10-CM

## 2017-03-29 DIAGNOSIS — B351 Tinea unguium: Secondary | ICD-10-CM

## 2017-03-29 DIAGNOSIS — L84 Corns and callosities: Secondary | ICD-10-CM | POA: Diagnosis not present

## 2017-03-29 DIAGNOSIS — M79676 Pain in unspecified toe(s): Secondary | ICD-10-CM | POA: Diagnosis not present

## 2017-03-30 NOTE — Progress Notes (Signed)
Subjective: [name] [name] presents the operative or concerns of thick, painful, elongated tenderness that he cannot trim himself as well as for painful calluses to the right foot. He states that since his last appointment with me he has followed up with a couple of other doctors in regards to this to discuss surgery however he states that no ready was able to help him. He also is asking that new diabetic shoes. He denies any drainage or swelling to the callusing toenails and has no other concerns and no recent wounds. Denies any systemic complaints such as fevers, chills, nausea, vomiting. No acute changes since last appointment, and no other complaints at this time.   PCP: No primary care provider on file.   Objective: AAO x3, NAD DP/PT pulses palpable bilaterally, CRT less than 3 seconds Sensation decreased with Simms Weinstein monofilament Nails are hypertrophic, dystrophic, brittle, discolored, elongated 10. No surrounding redness or drainage. Tenderness nails 1-5 bilaterally.  Hyperkeratotic lesion right submetatarsal 1 as well as the base of the fifth metatarsal. Upon debridement there is no underlying ulceration, drainage or clinical signs of infection present. Hammertoes are present. No open lesions or pre-ulcerative lesions are identified today. No pain with calf compression, swelling, warmth, erythema  Assessment: Symptomatic onychomycosis, hyperkeratotic lesions right foot  Plan: -All treatment options discussed with the patient including all alternatives, risks, complications.  -Nails are sharply debrided 10 without any complications or bleeding. -Hyperkeratotic lesions were sharply debrided 2 without any complications or bleeding -Discussed new pair diabetic shoes. He'll be due for them in January [facility] -Monitor for any clinical signs or symptoms of infection and directed to call the office immediately should any occur or go to the ER. -Patient encouraged  to call the office with any questions, concerns, change in symptoms.   Ovid CurdMatthew Tadeusz Stahl, DPM

## 2017-04-12 ENCOUNTER — Other Ambulatory Visit: Payer: Self-pay

## 2017-04-12 ENCOUNTER — Emergency Department (HOSPITAL_BASED_OUTPATIENT_CLINIC_OR_DEPARTMENT_OTHER): Payer: Medicare Other

## 2017-04-12 ENCOUNTER — Encounter (HOSPITAL_BASED_OUTPATIENT_CLINIC_OR_DEPARTMENT_OTHER): Payer: Self-pay | Admitting: *Deleted

## 2017-04-12 ENCOUNTER — Emergency Department (HOSPITAL_BASED_OUTPATIENT_CLINIC_OR_DEPARTMENT_OTHER)
Admission: EM | Admit: 2017-04-12 | Discharge: 2017-04-12 | Disposition: A | Discharge status: Home / Self Care | Payer: Medicare Other | Attending: Emergency Medicine | Admitting: Emergency Medicine

## 2017-04-12 DIAGNOSIS — M79671 Pain in right foot: Secondary | ICD-10-CM | POA: Diagnosis present

## 2017-04-12 DIAGNOSIS — N189 Chronic kidney disease, unspecified: Secondary | ICD-10-CM | POA: Diagnosis not present

## 2017-04-12 DIAGNOSIS — Z87891 Personal history of nicotine dependence: Secondary | ICD-10-CM | POA: Diagnosis not present

## 2017-04-12 DIAGNOSIS — Z79899 Other long term (current) drug therapy: Secondary | ICD-10-CM | POA: Insufficient documentation

## 2017-04-12 DIAGNOSIS — E1122 Type 2 diabetes mellitus with diabetic chronic kidney disease: Secondary | ICD-10-CM | POA: Insufficient documentation

## 2017-04-12 DIAGNOSIS — I129 Hypertensive chronic kidney disease with stage 1 through stage 4 chronic kidney disease, or unspecified chronic kidney disease: Secondary | ICD-10-CM | POA: Insufficient documentation

## 2017-04-12 DIAGNOSIS — M19071 Primary osteoarthritis, right ankle and foot: Secondary | ICD-10-CM | POA: Diagnosis not present

## 2017-04-12 DIAGNOSIS — Z7984 Long term (current) use of oral hypoglycemic drugs: Secondary | ICD-10-CM | POA: Diagnosis not present

## 2017-04-12 MED ORDER — NAPROXEN 500 MG PO TABS: 500.0000 mg | ORAL_TABLET | Freq: Two times a day (BID) | ORAL | 0 refills | Status: AC

## 2017-04-12 MED ORDER — OXYCODONE HCL 5 MG PO TABS
10.0000 mg | ORAL_TABLET | Freq: Once | ORAL | Status: AC
  Administered 2017-04-12: 10 mg via ORAL
  Filled 2017-04-12: qty 2

## 2017-04-12 MED FILL — NAPROXEN 500 MG TABLET: 500 | 7 days supply | Qty: 14 | Fill #0

## 2017-04-12 NOTE — ED Notes (Signed)
Urinal provided per pt request; visitor assisting.

## 2017-04-12 NOTE — ED Triage Notes (Signed)
Pt reports hx of diabetic neuropathy and right foot injuries and wounds to right foot, this am had increased pain to right foot. Denies any fevers or other c/o. Denies injury or trauma to foot.

## 2017-04-13 NOTE — ED Provider Notes (Signed)
[facility]ERGENC[facility] DEPARTMENT Provider Note   CSN: [id number] Arrival date & time: 04/12/17  1033     History   Chief Complaint Chief Complaint  Patient presents with  . Foot Pain    HPI [name] [name] is a 66 y.o. male.  HPI   Presents with concern for right foot pain.  Has history of right foot pain in the past and has had evaluation.  Has chronic pain provider.  Reports over the last few days, pain has significantly worsened, severe pain, worse on dorsum and around ankle. Denies trauma, falls. No fevers, no vomiting, no diarrhea.  No hx of gout.   Past Medical History:  Diagnosis Date  . Anemia    hx of blood transfusion  . Chronic kidney disease    acute renal failure in February admission  . Complication of anesthesia    difficulty waking up  . Depression   . Diabetes mellitus    not consistent on taking medications  . Gout   . History of blood clots 1986 or 87   blood clot in groin?  . History of headache   . History of hepatitis C    competed treatment  . Hyperlipidemia   . Hypertension    Takes prinzide daily  . Neuromuscular disorder (HCC)    gout and neuropathy  . Peripheral vascular disease ([place])   . Varicose veins   . Vein, varicose   . Wound check, dressing change    dressing changes 3x/week; open wound    Patient Active Problem List   Diagnosis Date Noted  . Abnormal ECG 02/21/2016  . PAD (peripheral artery disease) ([place]) 02/21/2016  . LAFB (left anterior fascicular block) 02/21/2016  . Eczema 11/30/2014  . Ulcer of lower extremity ([place]) 04/10/2014  . Tobacco abuse 06/30/2013  . Tinea pedis 03/31/2013  . HTN (hypertension) 02/12/2013  . Gout 12/04/2012  . Hepatitis C 02/22/2012  . Depression 02/22/2012  . Hyperlipidemia 02/22/2012  . Chronic low back pain 02/22/2012  . DM2 (diabetes mellitus, type 2) ([place]) 02/22/2012  . Neuropathy 02/22/2012  . Foot ulcer ([place]) 02/22/2012    Past Surgical History:  Procedure  Laterality Date  . HEMORRHOID SURGERY    . VARICOSE VEIN SURGERY    . VASCULAR SURGERY  1980s   left leg       Home Medications    Prior to Admission medications   Medication Sig Start Date End Date Taking? Authorizing Provider  Alcohol Swabs (B-D SINGLE USE SWABS REGULAR) PADS Use to check blood sugar once a day. Dx E11.91 12/22/14   Debbrah Alar, NP  allopurinol (ZYLOPRIM) 100 MG tablet  03/18/17   [provider]  allopurinol (ZYLOPRIM) 300 MG tablet Take 300 mg by mouth daily. 03/17/16   [provider]  amitriptyline (ELAVIL) 25 MG tablet TAKE 2-3 TABLETS BY MOUTH DAILY AT BEDTIME AS DIRECTED. 03/28/16   Debbrah Alar, NP  atorvastatin (LIPITOR) 40 MG tablet Take 1 tablet (40 mg total) by mouth daily. 03/28/16   Debbrah Alar, NP  Blood Glucose Monitoring Suppl (ONE TOUCH ULTRA 2) w/Device KIT USE TO CHECK BLOOD SUGAR ONCE A DAY. DX E11.9 03/28/16   Debbrah Alar, NP  colchicine 0.6 MG tablet IF GOUT FLARE TAKE 2 TABLETS FOLLOWED BY 1 TABLET 1 HOUR LATER. THEN RESUME ONE TABLET DAILY. 04/17/16   Debbrah Alar, NP  Ferrous Sulfate (IRON) 325 (65 FE) MG TABS Take 1 tablet by mouth daily. 04/24/14   Saguier, Percell Miller,  PA-C  furosemide (LASIX) 20 MG tablet Take 1 tablet (20 mg total) by mouth daily. 04/13/16   Shelda Pal, DO  gabapentin (NEURONTIN) 800 MG tablet TAKE ONE TABLET BY MOUTH 3 TIMES DAILY(AM,NOON,EVE) 04/17/16   Debbrah Alar, NP  glucose blood test strip ONE TOUCH ULTRA. Use as instructed to check blood sugar once a day.  DX.. E11.9 03/28/16   Debbrah Alar, NP  hydrochlorothiazide (HYDRODIURIL) 12.5 MG tablet  03/18/17   [provider]  lidocaine (XYLOCAINE) 5 % ointment APPLY 1 GRAM TOPICALLY TO THE AFFECTED AREA FOUR TIMES DAILY 05/10/15   Debbrah Alar, NP  losartan (COZAAR) 25 MG tablet TAKE ONE TABLET BY MOUTH ONCE DAILY. 03/28/16   Debbrah Alar, NP  metFORMIN (GLUCOPHAGE-XR)  500 MG 24 hr tablet Take 2 tablets (1,000 mg total) by mouth daily with breakfast. 04/13/16   Shelda Pal, DO  methadone (DOLOPHINE) 10 MG tablet Take 10 mg by mouth 3 (three) times daily. 03/29/13   [provider]  metoprolol succinate (TOPROL-XL) 25 MG 24 hr tablet Take 1 tablet (25 mg total) by mouth daily. 04/13/16   Debbrah Alar, NP  naproxen (NAPROSYN) 500 MG tablet Take 1 tablet (500 mg total) 2 (two) times daily with a meal for 7 days by mouth. 04/12/17 04/19/17  Gareth Morgan, MD  NARCAN 4 MG/0.1ML LIQD nasal spray kit 1 - 2 PUFFS AS DIRECTED IN CASE OF ACCIDENTAL OVER DOSE OF PAIN MEDICATIONS 03/20/16   [provider]  [facility] DELICA LANCETS 69G MISC Use to check blood sugar once a day.  Dx  E11.9 03/28/16   Debbrah Alar, NP  Oxycodone HCl 20 MG TABS Take 1 tablet (20 mg total) by mouth 4 (four) times daily as needed (for pain). Take 1/2 - 1 tablet (10-'20mg'$ ) by mouth 2-4 times daily as needed for pain 11/27/11   Roczniak, Nancy Nordmann, PA-C  pregabalin (LYRICA) 300 MG capsule Take 1 capsule (300 mg total) by mouth 2 (two) times daily. 04/13/16   Shelda Pal, DO  QUEtiapine (SEROQUEL) 100 MG tablet  03/18/17   [provider]  sitaGLIPtin (JANUVIA) 100 MG tablet Take 1 tablet (100 mg total) by mouth daily. 03/28/16   Debbrah Alar, NP  vitamin B-12 (CYANOCOBALAMIN) 1000 MCG tablet Take 1,000 mcg by mouth daily.    [provider]    Family History Family History  Problem Relation Age of Onset  . Cancer Mother        liver?  Marland Kitchen Hyperlipidemia Father   . Hypertension Father   . Diabetes Father   . Alcohol abuse Father   . Arthritis Father   . Kidney disease Father   . Diabetes Son   . Heart attack Neg Hx     Social History Social History   Tobacco Use  . Smoking status: Former Smoker    Packs/day: 0.25    Years: 35.00    Pack years: 8.75    Types: Cigars, Cigarettes    Last attempt to quit:  12/07/2015    Years since quitting: 1.3  . Smokeless tobacco: Never Used  . Tobacco comment: 3 cigars a day  Substance Use Topics  . Alcohol use: No    Alcohol/week: 0.0 oz  . Drug use: No     Allergies   Pork-derived products; Bactrim [sulfamethoxazole-trimethoprim]; Lisinopril; Percocet [oxycodone-acetaminophen]; Acetaminophen; and Metformin and related   Review of Systems Review of Systems  Constitutional: Negative for fever.  HENT: Negative for sore throat.  Eyes: Negative for visual disturbance.  Respiratory: Negative for shortness of breath.   Cardiovascular: Negative for chest pain.  Gastrointestinal: Negative for abdominal pain.  Genitourinary: Negative for difficulty urinating.  Musculoskeletal: Positive for arthralgias. Negative for back pain and neck stiffness.  Skin: Negative for rash.  Neurological: Negative for syncope and headaches.     Physical Exam Updated Vital Signs BP 110/67 (BP Location: Right Arm)   Pulse (!) 56   Temp 97.9 F (36.6 C) (Oral)   Resp 18   Ht '6\' 1"'$  (1.854 m)   Wt 120.2 kg (265 lb)   SpO2 100%   BMI 34.96 kg/m   Physical Exam  Constitutional: He is oriented to person, place, and time. He appears well-developed and well-nourished. No distress.  HENT:  Head: Normocephalic and atraumatic.  Eyes: Conjunctivae and EOM are normal.  Neck: Normal range of motion.  Cardiovascular: Normal rate, regular rhythm and intact distal pulses.  Pulmonary/Chest: Effort normal. No respiratory distress.  Musculoskeletal: He exhibits no edema.       Right ankle: He exhibits normal pulse. Decreased range of motion: reports pain with ROM. Tenderness. AITFL tenderness found.       Right foot: There is tenderness (callus first MTP, healed ulcer 5th MTP). There is normal capillary refill.  Neurological: He is alert and oriented to person, place, and time.  Skin: Skin is warm and dry. He is not diaphoretic.  Nursing note and vitals reviewed.    ED  Treatments / Results  Labs (all labs ordered are listed, but only abnormal results are displayed) Labs Reviewed - No data to display  EKG  EKG Interpretation None       Radiology Dg Ankle Complete Right  Result Date: 04/12/2017 CLINICAL DATA:  Diabetes mellitus. Right ankle pain and swelling. No reported injury. EXAM: RIGHT ANKLE - COMPLETE 3+ VIEW COMPARISON:  None. FINDINGS: Mild diffuse soft tissue swelling. No fracture or subluxation. No suspicious focal osseous lesion. No appreciable cortical erosions or periosteal reaction. Mild degenerative changes throughout the right ankle joint and tarsal joints. Small Achilles right calcaneal spur. No radiopaque foreign body. IMPRESSION: Mild diffuse right ankle soft tissue swelling. Mild polyarticular osteoarthritis in the right ankle and right tarsal joints. No fracture, subluxation, specific radiographic findings of osteomyelitis or radiopaque foreign body. Electronically Signed   By: Ilona Sorrel M.D.   On: 04/12/2017 12:07   Dg Foot Complete Right  Result Date: 04/12/2017 CLINICAL DATA:  Right foot pain and swelling. Diabetes mellitus. No reported injury. EXAM: RIGHT FOOT COMPLETE - 3+ VIEW COMPARISON:  None. FINDINGS: No fracture or dislocation. Moderate first metatarsal-phalangeal joint osteoarthritis. Mild degenerative changes throughout the tarsal joints. Small Achilles right calcaneal spur. No suspicious focal osseous lesion. No appreciable cortical erosions or periosteal reaction. No radiopaque foreign body. IMPRESSION: Mild to moderate polyarticular osteoarthritis in the right foot. No fracture, dislocation, specific radiographic findings of osteomyelitis or radiopaque foreign body. Electronically Signed   By: Ilona Sorrel M.D.   On: 04/12/2017 12:05    Procedures Procedures (including critical care time)  Medications Ordered in ED Medications  oxyCODONE (Oxy IR/ROXICODONE) immediate release tablet 10 mg (10 mg Oral Given 04/12/17  1131)     Initial Impression / Assessment and Plan / ED Course  I have reviewed the triage vital signs and the nursing notes.  Pertinent labs & imaging results that were available during my care of the patient were reviewed by me and considered in my medical decision making (see chart  for details).    66yo male with history above presents with worsening right foot pain.  No fevers, no erythema, no sign of cellulitis or abscess. Low suspicion for septic joint. XR shows osteoarthritis, review of prior outpatient visits show prior right foot pain, diagnosed with charcot foot. Discussed further close follow up with podiatry and/or orthopedics is indicated and close outpt MRI may also be appropriate.   Given oxycodone in ED. Discussed we cannot provide narcotic rx given hx of chronic pain, pain contract. Given rx for naproxen and recommend close follow up with primary providers. Patient discharged in stable condition with understanding of reasons to return.   Final Clinical Impressions(s) / ED Diagnoses   Final diagnoses:  Osteoarthritis of right foot, unspecified osteoarthritis type    ED Discharge Orders        Ordered    naproxen (NAPROSYN) 500 MG tablet  2 times daily with meals     04/12/17 1226       Gareth Morgan, MD 04/13/17 1551

## 2017-04-16 ENCOUNTER — Encounter (HOSPITAL_BASED_OUTPATIENT_CLINIC_OR_DEPARTMENT_OTHER): Payer: Self-pay

## 2017-04-16 ENCOUNTER — Emergency Department (HOSPITAL_BASED_OUTPATIENT_CLINIC_OR_DEPARTMENT_OTHER): Payer: Medicare Other

## 2017-04-16 ENCOUNTER — Other Ambulatory Visit: Payer: Self-pay

## 2017-04-16 ENCOUNTER — Emergency Department (HOSPITAL_BASED_OUTPATIENT_CLINIC_OR_DEPARTMENT_OTHER)
Admission: EM | Admit: 2017-04-16 | Discharge: 2017-04-16 | Disposition: A | Discharge status: Home / Self Care | Payer: Medicare Other | Attending: Emergency Medicine | Admitting: Emergency Medicine

## 2017-04-16 DIAGNOSIS — Z79899 Other long term (current) drug therapy: Secondary | ICD-10-CM | POA: Insufficient documentation

## 2017-04-16 DIAGNOSIS — Z87891 Personal history of nicotine dependence: Secondary | ICD-10-CM | POA: Insufficient documentation

## 2017-04-16 DIAGNOSIS — M545 Low back pain, unspecified: Secondary | ICD-10-CM

## 2017-04-16 DIAGNOSIS — I129 Hypertensive chronic kidney disease with stage 1 through stage 4 chronic kidney disease, or unspecified chronic kidney disease: Secondary | ICD-10-CM | POA: Diagnosis not present

## 2017-04-16 DIAGNOSIS — Y999 Unspecified external cause status: Secondary | ICD-10-CM | POA: Diagnosis not present

## 2017-04-16 DIAGNOSIS — E1122 Type 2 diabetes mellitus with diabetic chronic kidney disease: Secondary | ICD-10-CM | POA: Insufficient documentation

## 2017-04-16 DIAGNOSIS — R519 Headache, unspecified: Secondary | ICD-10-CM

## 2017-04-16 DIAGNOSIS — Y92410 Unspecified street and highway as the place of occurrence of the external cause: Secondary | ICD-10-CM | POA: Diagnosis not present

## 2017-04-16 DIAGNOSIS — R51 Headache: Secondary | ICD-10-CM | POA: Insufficient documentation

## 2017-04-16 DIAGNOSIS — N189 Chronic kidney disease, unspecified: Secondary | ICD-10-CM | POA: Insufficient documentation

## 2017-04-16 DIAGNOSIS — Z7984 Long term (current) use of oral hypoglycemic drugs: Secondary | ICD-10-CM | POA: Diagnosis not present

## 2017-04-16 DIAGNOSIS — Y9389 Activity, other specified: Secondary | ICD-10-CM | POA: Insufficient documentation

## 2017-04-16 HISTORY — DX: Other chronic pain: G89.29

## 2017-04-16 MED ORDER — OXYCODONE-ACETAMINOPHEN 5-325 MG PO TABS
1.0000 | ORAL_TABLET | Freq: Once | ORAL | Status: AC
  Administered 2017-04-16: 1 via ORAL
  Filled 2017-04-16: qty 1

## 2017-04-16 MED ORDER — CYCLOBENZAPRINE HCL 10 MG PO TABS
ORAL_TABLET | ORAL | Status: AC
  Administered 2017-04-16: 5 mg via ORAL
  Filled 2017-04-16: qty 1

## 2017-04-16 MED ORDER — ONDANSETRON 4 MG PO TBDP
4.0000 mg | ORAL_TABLET | Freq: Once | ORAL | Status: AC
  Administered 2017-04-16: 4 mg via ORAL
  Filled 2017-04-16: qty 1

## 2017-04-16 MED ORDER — CYCLOBENZAPRINE HCL 5 MG PO TABS
5.0000 mg | ORAL_TABLET | Freq: Once | ORAL | Status: AC
  Administered 2017-04-16: 5 mg via ORAL

## 2017-04-16 NOTE — Discharge Instructions (Signed)
The CT scan of your head was negative for brain injury or brain bleed.   The scan of your spine showed no fracture.   Please fill your prescription for the muscle relaxer prescribed to you yesterday and take for lower back pain.  This medicine can make you drowsy, please do not drive, work or drink alcohol while taking this medicine.  He can also take 400 mg ibuprofen every 6 hours for pain.  Applying heat to the lower back and also provide relief.  Please schedule an appointment with your primary doctor if your symptoms are not improving in a week.  Return to emergency department if you develop a headache with vomiting, headache in which she had numbness in her feet or hands or any new or worsening symptoms.
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
## 2017-04-16 NOTE — ED Notes (Signed)
Patient transported to X-ray 

## 2017-06-28 ENCOUNTER — Ambulatory Visit (INDEPENDENT_AMBULATORY_CARE_PROVIDER_SITE_OTHER): Payer: Medicare Other | Admitting: Podiatry

## 2017-06-28 ENCOUNTER — Encounter: Payer: Self-pay | Admitting: Podiatry

## 2017-06-28 DIAGNOSIS — E1149 Type 2 diabetes mellitus with other diabetic neurological complication: Secondary | ICD-10-CM | POA: Diagnosis not present

## 2017-06-28 DIAGNOSIS — M79676 Pain in unspecified toe(s): Secondary | ICD-10-CM

## 2017-06-28 DIAGNOSIS — B351 Tinea unguium: Secondary | ICD-10-CM

## 2017-06-28 DIAGNOSIS — L84 Corns and callosities: Secondary | ICD-10-CM

## 2017-06-28 DIAGNOSIS — L989 Disorder of the skin and subcutaneous tissue, unspecified: Secondary | ICD-10-CM

## 2017-07-01 NOTE — Progress Notes (Signed)
Subjective: [name] [name] presents the operative or concerns of thick, painful, elongated tenderness that he cannot trim himself as well as for painful calluses to the right foot.  He also has had follow-up with [facility] in regards to the lesion on the right foot.  He is seen other doctors for this as well.  He has a history of ulceration to that area and since then he has been getting thick callus to the spot.  He is also requesting new diabetic shoes.  He thinks that getting a new diabetic shoes to help take pressure off the callus area to help decrease his pain.  He has no other concerns today. Denies any systemic complaints such as fevers, chills, nausea, vomiting. No acute changes since last appointment, and no other complaints at this time.   Objective: AAO x3, NAD DP/PT pulses palpable bilaterally, CRT less than 3 seconds Sensation decreased with Simms Weinstein monofilament Nails are hypertrophic, dystrophic, brittle, discolored, elongated 10. No surrounding redness or drainage. Tenderness nails 1-5 bilaterally.  Hyperkeratotic lesion right submetatarsal 1 as well as the base of the fifth metatarsal.  He does not want the callus removed today as he states that he gets more painful once is trimmed.  However I did trim just a small amount of this area and I did send it for pathology to make sure that there is nothing else going on as he is very concerned about this. Hammertoes are present. No open lesions or pre-ulcerative lesions are identified today. No pain with calf compression, swelling, warmth, erythema  Assessment: Symptomatic onychomycosis, hyperkeratotic lesions right foot  Plan: -All treatment options discussed with the patient including all alternatives, risks, complications.  -Nails are sharply debrided 10 without any complications or bleeding. -Does not want the hyperkeratotic tissue removed today.  I did remove a small amount of it after verbal consent was obtained I [facility]  [facility].  He feels more comfortable to make sure there is nothing else going on. -Paperwork was completed today for precertification of diabetic shoes. -Follow-up in 3 months or sooner if needed.  Call any questions or concerns.  Discussed with him that he seen another podiatrist as well and that he should try to stay with one doctor. -Monitor for any clinical signs or symptoms of infection and directed to call the office immediately should any occur or go to the ER. -Patient encouraged to call the office with any questions, concerns, change in symptoms.   Ovid CurdMatthew Alessio Bogan, DPM

## 2017-07-02 NOTE — Addendum Note (Signed)
Addended by: Hadley PenOX, Rithy Mandley R on: 07/02/2017 02:00 PM   Modules accepted: Orders

## 2017-07-04 ENCOUNTER — Telehealth: Payer: Self-pay | Admitting: *Deleted

## 2017-07-04 NOTE — Telephone Encounter (Signed)
Per Dr Ardelle AntonWagoner redid the [facility] that was nail culture but was actually skin lesion and [facility] Misty StanleyLisa

## 2017-07-17 ENCOUNTER — Telehealth: Payer: Self-pay | Admitting: *Deleted

## 2017-07-17 NOTE — Telephone Encounter (Signed)
Left message informing pt Dr. Ardelle AntonWagoner had reviewed biopsy results and to call for the review.

## 2017-07-17 NOTE — Telephone Encounter (Signed)
-----   Message from Vivi BarrackMatthew R Wagoner, DPM sent at 07/16/2017  6:48 AM EST ----- Please let him know that the biopsy of the skin lesion did show a likely callus and there was no other significant finding present.

## 2017-07-18 ENCOUNTER — Telehealth: Payer: Self-pay | Admitting: *Deleted

## 2017-07-18 NOTE — Telephone Encounter (Signed)
I informed pt of Dr. Gabriel RungWagoner's review of results. Pt states he had a hole in his foot and a doctor put a mesh in it to close it up and left a stitch and he is going to another doctor who will cut it out. I told pt I would inform Dr. Ardelle AntonWagoner.

## 2017-07-18 NOTE — Telephone Encounter (Signed)
Entered in error

## 2017-07-20 NOTE — Telephone Encounter (Signed)
Ok. I knew it was from where he had an ulcer before. He had been seeing [facility] as well for the same thing.

## 2017-07-25 DIAGNOSIS — I10 Essential (primary) hypertension: Secondary | ICD-10-CM | POA: Insufficient documentation

## 2017-07-26 ENCOUNTER — Other Ambulatory Visit: Payer: Medicare Other | Admitting: Orthotics

## 2017-08-16 ENCOUNTER — Ambulatory Visit (INDEPENDENT_AMBULATORY_CARE_PROVIDER_SITE_OTHER): Payer: Medicare Other | Admitting: Orthotics

## 2017-08-16 DIAGNOSIS — L84 Corns and callosities: Secondary | ICD-10-CM | POA: Diagnosis not present

## 2017-08-16 DIAGNOSIS — E1149 Type 2 diabetes mellitus with other diabetic neurological complication: Secondary | ICD-10-CM | POA: Diagnosis not present

## 2017-08-16 DIAGNOSIS — Z87898 Personal history of other specified conditions: Secondary | ICD-10-CM | POA: Diagnosis not present

## 2017-08-16 NOTE — Progress Notes (Signed)

## 2017-09-19 ENCOUNTER — Encounter (HOSPITAL_BASED_OUTPATIENT_CLINIC_OR_DEPARTMENT_OTHER): Payer: Medicare Other | Attending: Internal Medicine

## 2017-09-19 DIAGNOSIS — Z87891 Personal history of nicotine dependence: Secondary | ICD-10-CM | POA: Diagnosis not present

## 2017-09-19 DIAGNOSIS — E11621 Type 2 diabetes mellitus with foot ulcer: Secondary | ICD-10-CM | POA: Diagnosis not present

## 2017-09-19 DIAGNOSIS — Y838 Other surgical procedures as the cause of abnormal reaction of the patient, or of later complication, without mention of misadventure at the time of the procedure: Secondary | ICD-10-CM | POA: Diagnosis not present

## 2017-09-19 DIAGNOSIS — E1140 Type 2 diabetes mellitus with diabetic neuropathy, unspecified: Secondary | ICD-10-CM | POA: Diagnosis not present

## 2017-09-19 DIAGNOSIS — I70245 Atherosclerosis of native arteries of left leg with ulceration of other part of foot: Secondary | ICD-10-CM | POA: Insufficient documentation

## 2017-09-19 DIAGNOSIS — E1151 Type 2 diabetes mellitus with diabetic peripheral angiopathy without gangrene: Secondary | ICD-10-CM | POA: Diagnosis not present

## 2017-09-19 DIAGNOSIS — I87332 Chronic venous hypertension (idiopathic) with ulcer and inflammation of left lower extremity: Secondary | ICD-10-CM | POA: Insufficient documentation

## 2017-09-19 DIAGNOSIS — T8131XA Disruption of external operation (surgical) wound, not elsewhere classified, initial encounter: Secondary | ICD-10-CM | POA: Insufficient documentation

## 2017-09-19 DIAGNOSIS — L97516 Non-pressure chronic ulcer of other part of right foot with bone involvement without evidence of necrosis: Secondary | ICD-10-CM | POA: Insufficient documentation

## 2017-09-26 DIAGNOSIS — T8131XA Disruption of external operation (surgical) wound, not elsewhere classified, initial encounter: Secondary | ICD-10-CM | POA: Diagnosis not present

## 2017-09-27 ENCOUNTER — Ambulatory Visit: Payer: Medicare Other | Admitting: Podiatry

## 2017-10-03 ENCOUNTER — Encounter: Payer: Self-pay | Admitting: Podiatry

## 2017-10-03 ENCOUNTER — Ambulatory Visit (INDEPENDENT_AMBULATORY_CARE_PROVIDER_SITE_OTHER): Payer: Medicare Other | Admitting: Podiatry

## 2017-10-03 ENCOUNTER — Encounter (HOSPITAL_BASED_OUTPATIENT_CLINIC_OR_DEPARTMENT_OTHER): Payer: Medicare Other | Attending: Physician Assistant

## 2017-10-03 DIAGNOSIS — M79676 Pain in unspecified toe(s): Secondary | ICD-10-CM | POA: Diagnosis not present

## 2017-10-03 DIAGNOSIS — T8131XA Disruption of external operation (surgical) wound, not elsewhere classified, initial encounter: Secondary | ICD-10-CM | POA: Insufficient documentation

## 2017-10-03 DIAGNOSIS — E1149 Type 2 diabetes mellitus with other diabetic neurological complication: Secondary | ICD-10-CM | POA: Diagnosis not present

## 2017-10-03 DIAGNOSIS — L853 Xerosis cutis: Secondary | ICD-10-CM

## 2017-10-03 DIAGNOSIS — B351 Tinea unguium: Secondary | ICD-10-CM

## 2017-10-03 DIAGNOSIS — I10 Essential (primary) hypertension: Secondary | ICD-10-CM | POA: Insufficient documentation

## 2017-10-03 DIAGNOSIS — E11621 Type 2 diabetes mellitus with foot ulcer: Secondary | ICD-10-CM | POA: Insufficient documentation

## 2017-10-03 DIAGNOSIS — E1140 Type 2 diabetes mellitus with diabetic neuropathy, unspecified: Secondary | ICD-10-CM | POA: Insufficient documentation

## 2017-10-03 DIAGNOSIS — Y838 Other surgical procedures as the cause of abnormal reaction of the patient, or of later complication, without mention of misadventure at the time of the procedure: Secondary | ICD-10-CM | POA: Insufficient documentation

## 2017-10-03 DIAGNOSIS — L97512 Non-pressure chronic ulcer of other part of right foot with fat layer exposed: Secondary | ICD-10-CM | POA: Insufficient documentation

## 2017-10-03 DIAGNOSIS — Z86718 Personal history of other venous thrombosis and embolism: Secondary | ICD-10-CM | POA: Insufficient documentation

## 2017-10-03 DIAGNOSIS — L97419 Non-pressure chronic ulcer of right heel and midfoot with unspecified severity: Secondary | ICD-10-CM | POA: Insufficient documentation

## 2017-10-03 MED ORDER — AMMONIUM LACTATE 12 % EX CREA: TOPICAL_CREAM | CUTANEOUS | 0 refills | Status: DC | PRN

## 2017-10-05 DIAGNOSIS — L97419 Non-pressure chronic ulcer of right heel and midfoot with unspecified severity: Secondary | ICD-10-CM | POA: Diagnosis not present

## 2017-10-05 DIAGNOSIS — Y838 Other surgical procedures as the cause of abnormal reaction of the patient, or of later complication, without mention of misadventure at the time of the procedure: Secondary | ICD-10-CM | POA: Diagnosis not present

## 2017-10-05 DIAGNOSIS — I10 Essential (primary) hypertension: Secondary | ICD-10-CM | POA: Diagnosis not present

## 2017-10-05 DIAGNOSIS — L97512 Non-pressure chronic ulcer of other part of right foot with fat layer exposed: Secondary | ICD-10-CM | POA: Diagnosis not present

## 2017-10-05 DIAGNOSIS — T8131XA Disruption of external operation (surgical) wound, not elsewhere classified, initial encounter: Secondary | ICD-10-CM | POA: Diagnosis present

## 2017-10-05 DIAGNOSIS — E11621 Type 2 diabetes mellitus with foot ulcer: Secondary | ICD-10-CM | POA: Diagnosis not present

## 2017-10-05 DIAGNOSIS — Z86718 Personal history of other venous thrombosis and embolism: Secondary | ICD-10-CM | POA: Diagnosis not present

## 2017-10-05 DIAGNOSIS — E1140 Type 2 diabetes mellitus with diabetic neuropathy, unspecified: Secondary | ICD-10-CM | POA: Diagnosis not present

## 2017-10-08 NOTE — Progress Notes (Signed)
Subjective: Majid presents the operative or concerns of thick, painful, elongated tenderness that he cannot trim himself.  Since I last saw him he had a callused area on the right foot excised unfortunate resulting in a wound dehiscence.  He states that this is his fault but the area opened up as he was on his foot a lot after the surgery.  He is asking for moisturizer to the dry skin. Denies any systemic complaints such as fevers, chills, nausea, vomiting. No acute changes since last appointment, and no other complaints at this time.   Objective: AAO x3, NAD DP/PT pulses palpable bilaterally, CRT less than 3 seconds Sensation decreased with Simms Weinstein monofilament Nails are hypertrophic, dystrophic, brittle, discolored, elongated 10. No surrounding redness or drainage. Tenderness nails 1-5 bilaterally.  There is a bandage intact of the right foot but he did not want to remove these under the active care of the wound care center for this.   Dry skin present no open lesions or pre-ulcerative lesions are identified today. No pain with calf compression, swelling, warmth, erythema  Assessment: Symptomatic onychomycosis, dry skin  Plan: -All treatment options discussed with the patient including all alternatives, risks, complications.  -Nails are sharply debrided 10 without any complications or bleeding. -I ordered AmLactin cream for skin. -We will defer the wound to the wound care center.  I also had another discussion with him in regards to him seeing multiple podiatrists. He would like to continue to see me for nail care.   Vivi Barrack DPM

## 2017-10-11 DIAGNOSIS — T8131XA Disruption of external operation (surgical) wound, not elsewhere classified, initial encounter: Secondary | ICD-10-CM | POA: Diagnosis not present

## 2017-10-17 ENCOUNTER — Other Ambulatory Visit: Payer: Self-pay | Admitting: Physician Assistant

## 2017-10-17 DIAGNOSIS — T8131XA Disruption of external operation (surgical) wound, not elsewhere classified, initial encounter: Secondary | ICD-10-CM | POA: Diagnosis not present

## 2017-10-22 ENCOUNTER — Other Ambulatory Visit: Payer: Self-pay | Admitting: Physician Assistant

## 2017-10-22 DIAGNOSIS — E13621 Other specified diabetes mellitus with foot ulcer: Secondary | ICD-10-CM

## 2017-10-22 DIAGNOSIS — L97516 Non-pressure chronic ulcer of other part of right foot with bone involvement without evidence of necrosis: Principal | ICD-10-CM

## 2017-10-24 ENCOUNTER — Ambulatory Visit (HOSPITAL_COMMUNITY)
Admission: RE | Admit: 2017-10-24 | Discharge: 2017-10-24 | Disposition: A | Discharge status: Home / Self Care | Payer: Medicare Other | Source: Ambulatory Visit | Attending: Physician Assistant | Admitting: Physician Assistant

## 2017-10-24 DIAGNOSIS — L97516 Non-pressure chronic ulcer of other part of right foot with bone involvement without evidence of necrosis: Secondary | ICD-10-CM

## 2017-10-24 DIAGNOSIS — E13621 Other specified diabetes mellitus with foot ulcer: Secondary | ICD-10-CM | POA: Diagnosis present

## 2017-10-24 DIAGNOSIS — E11621 Type 2 diabetes mellitus with foot ulcer: Secondary | ICD-10-CM | POA: Diagnosis not present

## 2017-10-24 DIAGNOSIS — M19071 Primary osteoarthritis, right ankle and foot: Secondary | ICD-10-CM | POA: Insufficient documentation

## 2017-10-24 DIAGNOSIS — L97519 Non-pressure chronic ulcer of other part of right foot with unspecified severity: Secondary | ICD-10-CM | POA: Insufficient documentation

## 2017-10-24 DIAGNOSIS — T8131XA Disruption of external operation (surgical) wound, not elsewhere classified, initial encounter: Secondary | ICD-10-CM | POA: Diagnosis not present

## 2017-10-24 MED ORDER — GADOBENATE DIMEGLUMINE 529 MG/ML IV SOLN
20.0000 mL | Freq: Once | INTRAVENOUS | Status: AC | PRN
  Administered 2017-10-24: 20 mL via INTRAVENOUS

## 2017-10-25 LAB — POCT I-STAT CREATININE: CREATININE: 1.2 mg/dL (ref 0.61–1.24)

## 2017-10-31 DIAGNOSIS — T8131XA Disruption of external operation (surgical) wound, not elsewhere classified, initial encounter: Secondary | ICD-10-CM | POA: Diagnosis not present

## 2017-11-07 ENCOUNTER — Encounter (HOSPITAL_BASED_OUTPATIENT_CLINIC_OR_DEPARTMENT_OTHER): Payer: Medicare Other | Attending: Physician Assistant

## 2017-11-07 DIAGNOSIS — E1140 Type 2 diabetes mellitus with diabetic neuropathy, unspecified: Secondary | ICD-10-CM | POA: Diagnosis not present

## 2017-11-07 DIAGNOSIS — Z87891 Personal history of nicotine dependence: Secondary | ICD-10-CM | POA: Insufficient documentation

## 2017-11-07 DIAGNOSIS — L97512 Non-pressure chronic ulcer of other part of right foot with fat layer exposed: Secondary | ICD-10-CM | POA: Insufficient documentation

## 2017-11-07 DIAGNOSIS — N189 Chronic kidney disease, unspecified: Secondary | ICD-10-CM | POA: Diagnosis not present

## 2017-11-07 DIAGNOSIS — I87331 Chronic venous hypertension (idiopathic) with ulcer and inflammation of right lower extremity: Secondary | ICD-10-CM | POA: Diagnosis present

## 2017-11-07 DIAGNOSIS — I129 Hypertensive chronic kidney disease with stage 1 through stage 4 chronic kidney disease, or unspecified chronic kidney disease: Secondary | ICD-10-CM | POA: Diagnosis not present

## 2017-11-07 DIAGNOSIS — Z86718 Personal history of other venous thrombosis and embolism: Secondary | ICD-10-CM | POA: Insufficient documentation

## 2017-11-07 DIAGNOSIS — Z7984 Long term (current) use of oral hypoglycemic drugs: Secondary | ICD-10-CM | POA: Insufficient documentation

## 2017-11-07 DIAGNOSIS — E11621 Type 2 diabetes mellitus with foot ulcer: Secondary | ICD-10-CM | POA: Diagnosis not present

## 2017-11-07 DIAGNOSIS — E1122 Type 2 diabetes mellitus with diabetic chronic kidney disease: Secondary | ICD-10-CM | POA: Diagnosis not present

## 2017-11-14 DIAGNOSIS — E11621 Type 2 diabetes mellitus with foot ulcer: Secondary | ICD-10-CM | POA: Diagnosis not present

## 2017-11-21 DIAGNOSIS — E11621 Type 2 diabetes mellitus with foot ulcer: Secondary | ICD-10-CM | POA: Diagnosis not present

## 2017-12-05 ENCOUNTER — Encounter (HOSPITAL_BASED_OUTPATIENT_CLINIC_OR_DEPARTMENT_OTHER): Payer: Medicare Other | Attending: Physician Assistant

## 2017-12-05 ENCOUNTER — Encounter (HOSPITAL_BASED_OUTPATIENT_CLINIC_OR_DEPARTMENT_OTHER): Payer: Self-pay

## 2017-12-05 DIAGNOSIS — E1122 Type 2 diabetes mellitus with diabetic chronic kidney disease: Secondary | ICD-10-CM | POA: Diagnosis not present

## 2017-12-05 DIAGNOSIS — Z86718 Personal history of other venous thrombosis and embolism: Secondary | ICD-10-CM | POA: Insufficient documentation

## 2017-12-05 DIAGNOSIS — Z7984 Long term (current) use of oral hypoglycemic drugs: Secondary | ICD-10-CM | POA: Diagnosis not present

## 2017-12-05 DIAGNOSIS — L97512 Non-pressure chronic ulcer of other part of right foot with fat layer exposed: Secondary | ICD-10-CM | POA: Diagnosis not present

## 2017-12-05 DIAGNOSIS — N189 Chronic kidney disease, unspecified: Secondary | ICD-10-CM | POA: Insufficient documentation

## 2017-12-05 DIAGNOSIS — I129 Hypertensive chronic kidney disease with stage 1 through stage 4 chronic kidney disease, or unspecified chronic kidney disease: Secondary | ICD-10-CM | POA: Insufficient documentation

## 2017-12-05 DIAGNOSIS — Z87891 Personal history of nicotine dependence: Secondary | ICD-10-CM | POA: Insufficient documentation

## 2017-12-05 DIAGNOSIS — E11622 Type 2 diabetes mellitus with other skin ulcer: Secondary | ICD-10-CM | POA: Insufficient documentation

## 2017-12-05 DIAGNOSIS — I87331 Chronic venous hypertension (idiopathic) with ulcer and inflammation of right lower extremity: Secondary | ICD-10-CM | POA: Insufficient documentation

## 2017-12-05 DIAGNOSIS — E1140 Type 2 diabetes mellitus with diabetic neuropathy, unspecified: Secondary | ICD-10-CM | POA: Insufficient documentation

## 2017-12-12 DIAGNOSIS — E11622 Type 2 diabetes mellitus with other skin ulcer: Secondary | ICD-10-CM | POA: Diagnosis not present

## 2018-01-01 ENCOUNTER — Ambulatory Visit: Payer: Medicare Other | Admitting: Podiatry

## 2019-07-11 DIAGNOSIS — M2041 Other hammer toe(s) (acquired), right foot: Secondary | ICD-10-CM | POA: Insufficient documentation

## 2019-07-11 DIAGNOSIS — M2042 Other hammer toe(s) (acquired), left foot: Secondary | ICD-10-CM | POA: Insufficient documentation

## 2021-03-02 ENCOUNTER — Other Ambulatory Visit: Payer: Self-pay

## 2021-03-02 ENCOUNTER — Encounter: Payer: Self-pay | Admitting: Podiatry

## 2021-03-02 ENCOUNTER — Ambulatory Visit (INDEPENDENT_AMBULATORY_CARE_PROVIDER_SITE_OTHER): Payer: 59 | Admitting: Podiatry

## 2021-03-02 DIAGNOSIS — L905 Scar conditions and fibrosis of skin: Secondary | ICD-10-CM

## 2021-03-02 DIAGNOSIS — M79675 Pain in left toe(s): Secondary | ICD-10-CM | POA: Diagnosis not present

## 2021-03-02 DIAGNOSIS — B351 Tinea unguium: Secondary | ICD-10-CM | POA: Diagnosis not present

## 2021-03-02 DIAGNOSIS — M79674 Pain in right toe(s): Secondary | ICD-10-CM | POA: Diagnosis not present

## 2021-03-02 NOTE — Progress Notes (Addendum)
This patient returns to my office for at risk foot care.  This patient requires this care by a professional since this patient will be at risk due to having PAD and type 2 diabetes.  Patient has history of surgery at the dorsal aspect outside of the right foot.  He says this area is severely painful and developed after his surgery five years ago. This patient is unable to cut nails himself since the patient cannot reach his nails.These nails are painful walking and wearing shoes.  This patient presents for at risk foot care today.  General Appearance  Alert, conversant and in no acute stress.  Vascular  Dorsalis pedis and posterior tibial  pulses are weakly palpable  bilaterally.  Capillary return is within normal limits  bilaterally. Temperature is within normal limits  bilaterally.  Neurologic  Senn-Weinstein monofilament wire test diminished   bilaterally. Muscle power within normal limits bilaterally.  Nails Thick disfigured discolored nails with subungual debris  from hallux to fifth toes bilaterally. No evidence of bacterial infection or drainage bilaterally.  Orthopedic  No limitations of motion  feet .  No crepitus or effusions noted.  No bony pathology .  Digital hammer toes  B/L.  Skin  normotropic skin with no porokeratosis noted bilaterally.  No signs of infections or ulcers noted.   Callus/cicatrix noted at the dorsolateral aspect right foot at the level of the fifth metabase.  Onychomycosis  Pain in right toes  Pain in left toes  Cicatrix right foot.  Consent was obtained for treatment procedures.   Mechanical debridement of nails 1-5  bilaterally performed with a nail nipper.  Filed with dremel without incident. Discussed treatment of callus with this patient.  I asked Dr.  Lilian Kapur rendered second opinion. He diagnosed the callus as cicatrix and recommended we treat this callus conservatively.  Prior to debriding the callus with # 15 blade the foot was anesthetized with 5 cc. Of 2%  lidocaine.  After debridement of lesion, the site was bandaged .  RTC 3 months.     Return office visit  3 months                    Told patient to return for periodic foot care and evaluation due to potential at risk complications.   Helane Gunther DPM

## 2021-03-10 ENCOUNTER — Other Ambulatory Visit: Payer: Self-pay

## 2021-03-10 ENCOUNTER — Ambulatory Visit (INDEPENDENT_AMBULATORY_CARE_PROVIDER_SITE_OTHER): Payer: Medicare (Managed Care) | Admitting: *Deleted

## 2021-03-10 DIAGNOSIS — L84 Corns and callosities: Secondary | ICD-10-CM

## 2021-03-10 DIAGNOSIS — E1142 Type 2 diabetes mellitus with diabetic polyneuropathy: Secondary | ICD-10-CM

## 2021-03-10 NOTE — Progress Notes (Signed)
Patient presents to the office today for diabetic shoe and insole measuring.  Patient was measured with brannock device to determine size and width for 1 pair of extra depth shoes and foam casted for 3 pair of insoles.   Documentation of medical necessity will be sent to patient's treating diabetic doctor to verify and sign.   Patient's diabetic provider: Dr. Fleet Contras  Shoes and insoles will be ordered at that time and patient will be notified for an appointment for fitting when they arrive.   Shoe size (per patient): 13   Brannock measurement: RIGHT - 12 EE, LEFT - 12.5 EE  Patient shoe selection-   1st choice:   Apex [id number]  2nd choice:  Orthofeet 481  Shoe size ordered: Men's 13 X-Wide

## 2021-04-12 ENCOUNTER — Telehealth: Payer: Self-pay | Admitting: Podiatry

## 2021-04-12 NOTE — Telephone Encounter (Signed)
Called patient again trying to get his insurance card so we can verify if he has out of network benefits as his insurance shows as being out of network with [place] but his number is not working. I have sent him a my chart message today.

## 2021-05-09 ENCOUNTER — Telehealth: Payer: Self-pay | Admitting: Podiatry

## 2021-05-09 NOTE — Telephone Encounter (Signed)
Pt left message Friday afternoon asking about diabetic shoe status.   Upon checking I have been trying to contact pt because the [employer] we have is out of network and I did not have a card to call.  I called pt and he stated that he has uhc only had the [facility] for 1 day. When I try to process the uhc mcr it shoes not active. I explained that I did not want to order the shoes and he would get a bill because it was not covered for 604.00. Pt is to call insurance company and call me back.

## 2021-05-11 ENCOUNTER — Telehealth: Payer: Self-pay | Admitting: Podiatry

## 2021-05-11 NOTE — Telephone Encounter (Signed)
Pt left several messages about his diabetic shoes and wanted to know when he can pick them up.  I returned call and left message and then pt called and I explained that I had not ordered the shoes and inserts because I could not get benefit verification because of insurance issues. I finally got the insurance to verify and have ordered the shoes and inserts and sent the paperwork to the doctor to sign off on. He stated so the process has begun and I said yes sir the process has started now and I would call pt when shoes/inserts are in.

## 2021-05-12 ENCOUNTER — Telehealth: Payer: Self-pay | Admitting: Podiatry

## 2021-05-12 NOTE — Telephone Encounter (Signed)
Pt left message on billing line stating he was just trying to talk to someone to see if he could come in and get the shoes..  I returned call and left a message. Pt called right back and upon asking he said that was an old message and we had already discussed and got everything is underway. I told him I thought so but wanted to make sure.

## 2021-06-01 ENCOUNTER — Telehealth: Payer: Self-pay

## 2021-06-01 ENCOUNTER — Ambulatory Visit: Payer: Medicare (Managed Care) | Admitting: Podiatry

## 2021-06-01 NOTE — Telephone Encounter (Signed)
Returned patient's phone call to let him know his shoes have shipped and we will call him to schedule appointment when the shoes are available. Patient is satisfied. All questions answered and concerns addressed.

## 2021-06-16 ENCOUNTER — Other Ambulatory Visit: Payer: Self-pay

## 2021-06-16 ENCOUNTER — Ambulatory Visit: Payer: Medicaid Other

## 2021-06-16 DIAGNOSIS — L84 Corns and callosities: Secondary | ICD-10-CM

## 2021-06-16 DIAGNOSIS — E1142 Type 2 diabetes mellitus with diabetic polyneuropathy: Secondary | ICD-10-CM

## 2021-06-16 NOTE — Progress Notes (Signed)
Attempted to fit APEX boots size 13XW and A5514 insoles to [name] [name]. Patient's foot and necessary offload to right base of 5th were not accomodated in the devices. Remeasured and recasted [name] [name] for [phone] custom insoles with 5th met relief as well as APEX X801M sneakers size 14XW. Patient tolerated procedure. All questions answered and concerns addressed. Patient to return in four weeks for fitting of shoes and custom insoles.

## 2021-06-23 ENCOUNTER — Other Ambulatory Visit: Payer: Self-pay

## 2021-06-23 ENCOUNTER — Ambulatory Visit (INDEPENDENT_AMBULATORY_CARE_PROVIDER_SITE_OTHER): Payer: Medicare Other

## 2021-06-23 ENCOUNTER — Ambulatory Visit (INDEPENDENT_AMBULATORY_CARE_PROVIDER_SITE_OTHER): Payer: Medicaid Other | Admitting: Podiatry

## 2021-06-23 DIAGNOSIS — L989 Disorder of the skin and subcutaneous tissue, unspecified: Secondary | ICD-10-CM | POA: Diagnosis not present

## 2021-06-23 DIAGNOSIS — L97511 Non-pressure chronic ulcer of other part of right foot limited to breakdown of skin: Secondary | ICD-10-CM

## 2021-06-23 DIAGNOSIS — M79671 Pain in right foot: Secondary | ICD-10-CM

## 2021-06-23 DIAGNOSIS — I739 Peripheral vascular disease, unspecified: Secondary | ICD-10-CM | POA: Diagnosis not present

## 2021-06-23 NOTE — Patient Instructions (Signed)

## 2021-06-24 LAB — CBC WITH DIFFERENTIAL/PLATELET
Absolute Monocytes: 870 cells/uL (ref 200–950)
Basophils Absolute: 44 cells/uL (ref 0–200)
Basophils Relative: 0.5 %
Eosinophils Absolute: 209 cells/uL (ref 15–500)
Eosinophils Relative: 2.4 %
HCT: 38 % — ABNORMAL LOW (ref 38.5–50.0)
Hemoglobin: 12.5 g/dL — ABNORMAL LOW (ref 13.2–17.1)
Lymphs Abs: 3724 cells/uL (ref 850–3900)
MCH: 31.2 pg (ref 27.0–33.0)
MCHC: 32.9 g/dL (ref 32.0–36.0)
MCV: 94.8 fL (ref 80.0–100.0)
MPV: 11.6 fL (ref 7.5–12.5)
Monocytes Relative: 10 %
Neutro Abs: 3854 cells/uL (ref 1500–7800)
Neutrophils Relative %: 44.3 %
Platelets: 240 10*3/uL (ref 140–400)
RBC: 4.01 10*6/uL — ABNORMAL LOW (ref 4.20–5.80)
RDW: 12.6 % (ref 11.0–15.0)
Total Lymphocyte: 42.8 %
WBC: 8.7 10*3/uL (ref 3.8–10.8)

## 2021-06-24 LAB — BASIC METABOLIC PANEL
BUN: 15 mg/dL (ref 7–25)
CO2: 28 mmol/L (ref 20–32)
Calcium: 9.2 mg/dL (ref 8.6–10.3)
Chloride: 104 mmol/L (ref 98–110)
Creat: 1.18 mg/dL (ref 0.70–1.35)
Glucose, Bld: 98 mg/dL (ref 65–99)
Potassium: 4.3 mmol/L (ref 3.5–5.3)
Sodium: 139 mmol/L (ref 135–146)

## 2021-06-24 LAB — HEMOGLOBIN A1C
Hgb A1c MFr Bld: 6.4 % of total Hgb — ABNORMAL HIGH (ref <5.7)
Mean Plasma Glucose: 137 mg/dL
eAG (mmol/L): 7.6 mmol/L

## 2021-06-26 NOTE — Progress Notes (Signed)
Subjective: 71 year old male presents the office today for concerns of significant discomfort, pain present to the lateral aspect of right foot.  This is been ongoing for about 6 years.  Previously and also to this area and after the wound healed developed significant callus formation.  He gets routine debridement of the area but this is not helping much anymore and he also discussed other treatment options.  Currently denies any swelling redness or any drainage.  No fevers or chills.  No open sores.  No other concerns.  Objective: AAO x3, NAD DP/PT pulses palpable bilaterally, CRT less than 3 seconds Sensation decreased. Gait hyperkeratotic lesion present right foot fifth metatarsal base.  There is no open lesions.  Tenderness palpation directly along the skin lesion.  There is no fluctuation or crepitation.  Malodor. No pain with calf compression, swelling, warmth, erythema  Assessment: Significant callus right foot  Plan: -All treatment options discussed with the patient including all alternatives, risks, complications.  -X-rays obtained reviewed.  No evidence of acute fracture or osteomyelitis. -We discussed the conservative as well as surgical treatment options.  Conservatively he has tried various different creams, topical pain creams, routine debridements, offloading techniques as well as shoe modification without any improvement.  We discussed surgical intervention to excise the lesion and grafting.  He wants to proceed with surgery.  Discussed with him by no means is this is a guarantee and we are also going to be creating a new wound to get this to heal and there is always a chance that the callus will come back or could worsen.  He understands the risks and wants to proceed. -The incision placement as well as the postoperative course was discussed with the patient. I discussed risks of the surgery which include, but not limited to, infection, bleeding, pain, swelling, need for further  surgery, delayed or nonhealing, painful or ugly scar, numbness or sensation changes, recurrence, transfer lesions, further deformity, hardware failure, DVT/PE, loss of toe/foot. Patient understands these risks and wishes to proceed with surgery. The surgical consent was reviewed with the patient all 3 pages were signed. No promises or guarantees were given to the outcome of the procedure. All questions were answered to the best of my ability. Before the surgery the patient was encouraged to call the office if there is any further questions. The surgery will be performed at the hospital on an outpatient basis. -ABI ordered to be done preoperatively -Sharply debrided some of the hyperkeratotic tissue to the any complications or bleeding. -Blood work ordered for preoperative evaluation. -Patient encouraged to call the office with any questions, concerns, change in symptoms.   Vivi Barrack DPM

## 2021-06-29 ENCOUNTER — Telehealth: Payer: Self-pay | Admitting: Urology

## 2021-06-29 NOTE — Telephone Encounter (Signed)
DOS - 07/20/21  EXC BENIGN LESION RIGHT --- 11423 GRAFT --- [id number]   [employer] EFFECTIVE DATE - 06/05/21  PLAN DEDUCTIBLE - $0.00 OUT OF POCKET -  $8,300.00 W/  $8,300.00 REMAINING COINSURANCE - 20% COPAY - $0.00  PER [employer] WEBSITE FRO CPT CODES 16109 AND 15275 Notification or Prior Authorization is not required for the requested services  Decision ID #:[id number]

## 2021-06-30 ENCOUNTER — Encounter (HOSPITAL_COMMUNITY): Payer: Medicaid Other

## 2021-06-30 NOTE — Progress Notes (Signed)
Sent message, via epic in basket, requesting orders in epic from surgeon.  

## 2021-07-01 NOTE — Patient Instructions (Addendum)
DUE TO COVID-19 ONLY ONE VISITOR IS ALLOWED TO COME WITH YOU AND STAY IN THE WAITING ROOM ONLY DURING PRE OP AND PROCEDURE DAY OF SURGERY.           Your procedure is scheduled on: 07-20-21   Report to [facility] Main  Entrance   Report to admitting at      1115 AM     Call this number if you have problems the morning of surgery [phone]   Remember: Do not eat food  :After Midnight. You may have clear liquids until 1030 am then nothing by mouth    CLEAR LIQUID DIET                                                                    water Black Coffee and tea, regular and decaf No Creamer                            Plain Jell-O any favor except red or purple                                  Fruit ices (not with fruit pulp)                                      Iced Popsicles                                     Carbonated beverages, regular and diet                                    Cranberry, grape and apple juices Sports drinks like Gatorade Lightly seasoned clear broth or consume(fat free) Sugar, honey syrup  _____________________________________________________________________      BRUSH YOUR TEETH MORNING OF SURGERY AND RINSE YOUR MOUTH OUT, NO CHEWING GUM CANDY OR MINTS.     Take these medicines the morning of surgery with A SIP OF WATER:   DO NOT TAKE ANY DIABETIC MEDICATIONS DAY OF YOUR SURGERY                               You may not have any metal on your body including hair pins and              piercings  Do not wear jewelry, lotions, powders,perfumes,        deodorant                     Men may shave face and neck.   Do not bring valuables to the hospital. [place].  Contacts, dentures or bridgework may not be worn into surgery.      Patients discharged the day of surgery will not be  allowed to drive home. IF YOU ARE HAVING SURGERY AND GOING HOME THE SAME DAY, YOU MUST HAVE AN ADULT TO DRIVE  YOU HOME AND BE WITH YOU FOR 24 HOURS. YOU MAY GO HOME BY TAXI OR UBER OR ORTHERWISE, BUT AN ADULT MUST ACCOMPANY YOU HOME AND STAY WITH YOU FOR 24 HOURS.  Name and phone number of your driver:  Special Instructions: N/A              Please read over the following fact sheets you were given: _____________________________________________________________________             [facility] - Preparing for Surgery Before surgery, you can play an important role.  Because skin is not sterile, your skin needs to be as free of germs as possible.  You can reduce the number of germs on your skin by washing with CHG (chlorahexidine gluconate) soap before surgery.  CHG is an antiseptic cleaner which kills germs and bonds with the skin to continue killing germs even after washing. Please DO NOT use if you have an allergy to CHG or antibacterial soaps.  If your skin becomes reddened/irritated stop using the CHG and inform your nurse when you arrive at Short Stay. Do not shave (including legs and underarms) for at least 48 hours prior to the first CHG shower.  You may shave your face/neck. Please follow these instructions carefully:  1.  Shower with CHG Soap the night before surgery and the  morning of Surgery.  2.  If you choose to wash your hair, wash your hair first as usual with your  normal  shampoo.  3.  After you shampoo, rinse your hair and body thoroughly to remove the  shampoo.                           4.  Use CHG as you would any other liquid soap.  You can apply chg directly  to the skin and wash                       Gently with a scrungie or clean washcloth.  5.  Apply the CHG Soap to your body ONLY FROM THE NECK DOWN.   Do not use on face/ open                           Wound or open sores. Avoid contact with eyes, ears mouth and genitals (private parts).                       Wash face,  Genitals (private parts) with your normal soap.             6.  Wash thoroughly, paying special attention to  the area where your surgery  will be performed.  7.  Thoroughly rinse your body with warm water from the neck down.  8.  DO NOT shower/wash with your normal soap after using and rinsing off  the CHG Soap.                9.  Pat yourself dry with a clean towel.            10.  Wear clean pajamas.            11.  Place clean sheets on your bed the night of your first shower and do not  sleep with pets. Day of Surgery : Do not apply any lotions/deodorants the morning of surgery.  Please wear clean clothes to the hospital/surgery center.  FAILURE TO FOLLOW THESE INSTRUCTIONS MAY RESULT IN THE CANCELLATION OF YOUR SURGERY PATIENT SIGNATURE_________________________________  NURSE SIGNATURE__________________________________  ________________________________________________________________________

## 2021-07-01 NOTE — Progress Notes (Addendum)
PCP - [facility] in [facility] : [facility] sees NP named Rochel Brome 07-04-21 on chart Cardiologist - no his PCP is supposed to be setting him up to see a cardiologist   PPM/ICD -  Device Orders -  Rep Notified -   Chest x-ray -  EKG - 07-07-21 Stress Test -  ECHO -  Cardiac Cath -   Sleep Study -  CPAP -   Fasting Blood Sugar - 100-120 Checks Blood Sugar ____2_ times a day  Blood Thinner Instructions: Aspirin Instructions:  ERAS Protcol - PRE-SURGERY Ensure or G2-   COVID TEST- N/A COVID vaccine -  Activity--Able to do ADL's without SOB Anesthesia review: CHF per LOV . Note on chart, DM ,HTN, HepC treated , Podiatry pt.  Patient denies shortness of breath, fever, cough and chest pain at PAT appointment   All instructions explained to the patient, with a verbal understanding of the material. Patient agrees to go over the instructions while at home for a better understanding. Patient also instructed to self quarantine after being tested for COVID-19. The opportunity to ask questions was provided.

## 2021-07-07 ENCOUNTER — Encounter (HOSPITAL_COMMUNITY)
Admission: RE | Admit: 2021-07-07 | Discharge: 2021-07-07 | Disposition: A | Discharge status: Home / Self Care | Payer: Medicare Other | Source: Ambulatory Visit | Attending: Podiatry | Admitting: Podiatry

## 2021-07-07 ENCOUNTER — Other Ambulatory Visit: Payer: Self-pay

## 2021-07-07 ENCOUNTER — Encounter (HOSPITAL_COMMUNITY): Payer: Self-pay

## 2021-07-07 DIAGNOSIS — G8194 Hemiplegia, unspecified affecting left nondominant side: Secondary | ICD-10-CM | POA: Diagnosis not present

## 2021-07-07 DIAGNOSIS — Z01818 Encounter for other preprocedural examination: Secondary | ICD-10-CM | POA: Diagnosis present

## 2021-07-07 DIAGNOSIS — N189 Chronic kidney disease, unspecified: Secondary | ICD-10-CM | POA: Diagnosis not present

## 2021-07-07 DIAGNOSIS — J439 Emphysema, unspecified: Secondary | ICD-10-CM | POA: Diagnosis not present

## 2021-07-07 DIAGNOSIS — I130 Hypertensive heart and chronic kidney disease with heart failure and stage 1 through stage 4 chronic kidney disease, or unspecified chronic kidney disease: Secondary | ICD-10-CM | POA: Diagnosis not present

## 2021-07-07 DIAGNOSIS — I509 Heart failure, unspecified: Secondary | ICD-10-CM | POA: Insufficient documentation

## 2021-07-07 DIAGNOSIS — E1122 Type 2 diabetes mellitus with diabetic chronic kidney disease: Secondary | ICD-10-CM | POA: Diagnosis not present

## 2021-07-07 DIAGNOSIS — L97519 Non-pressure chronic ulcer of other part of right foot with unspecified severity: Secondary | ICD-10-CM | POA: Diagnosis not present

## 2021-07-07 DIAGNOSIS — E1151 Type 2 diabetes mellitus with diabetic peripheral angiopathy without gangrene: Secondary | ICD-10-CM | POA: Diagnosis not present

## 2021-07-07 HISTORY — DX: Heart failure, unspecified: I50.9

## 2021-07-07 HISTORY — DX: Unspecified osteoarthritis, unspecified site: M19.90

## 2021-07-07 LAB — GLUCOSE, CAPILLARY: Glucose-Capillary: 101 mg/dL — ABNORMAL HIGH (ref 70–99)

## 2021-07-08 ENCOUNTER — Encounter (HOSPITAL_COMMUNITY): Payer: Self-pay | Admitting: Anesthesiology

## 2021-07-08 ENCOUNTER — Encounter (HOSPITAL_COMMUNITY): Payer: Self-pay | Admitting: Emergency Medicine

## 2021-07-08 NOTE — Progress Notes (Signed)
Anesthesia Chart Review:   Case: [id number] Date/Time: 07/20/21 1315   Procedures:      EXCISION BENIGN LESION (Right)     GRAFT APPLICATION (Right)   Anesthesia type: Choice   Pre-op diagnosis: SKIN LESION ULCERATED RIGHT FOOT   Location: [facility] ROOM 08 / [facility]   Surgeons: Trula Slade, DPM       DISCUSSION: Pt is 71 years old with hx CHF (per PCP note, last echo 2017 was normal, due for routine repeat echo; ordered but not yet done), PAD, HTN, DM, emphysema, L hemiparesis (possible CVA in past)  H&P on paper chart from PCP dated 07/04/21  VS: BP 116/73    Pulse 80    Temp 37.2 C (Oral)    Resp 18    Ht 6' (1.829 m)    Wt 114.3 kg    SpO2 96%    BMI 34.18 kg/m   PROVIDERS: Primary care at [facility], [place]. Last office visit 07/04/21 with Milton Ferguson, DO; not indicates Dr. Norton Blizzard is aware of upcoming surgery   LABS: Labs reviewed: Acceptable for surgery. - A1c 6.4 on 06/23/21 - BMP 06/23/21 normal - CBC w/diff 06/23/21: H/H 12.5/38.0  (all labs ordered are listed, but only abnormal results are displayed)  Labs Reviewed  GLUCOSE, CAPILLARY - Abnormal; Notable for the following components:      Result Value   Glucose-Capillary 101 (*)    All other components within normal limits    EKG 07/07/21: Sinus bradycardia. Incomplete RBBB. LAFB. No significant change since prior   CV: Echo 09/01/15:  - Left ventricle: The cavity size was normal. Wall thickness was   increased in a pattern of mild LVH. Systolic function was normal.   The estimated ejection fraction was in the range of 60% to 65%.   Wall motion was normal; there were no regional wall motion   abnormalities. Doppler parameters are consistent with abnormal   left ventricular relaxation (grade 1 diastolic dysfunction).  - Right atrium: The atrium was mildly dilated.    Past Medical History:  Diagnosis Date   Anemia    hx of blood transfusion   Arthritis    GOUT   CHF (congestive heart failure) (HCC)     Grade 1 diastolic function per PCP LOVN   Chronic kidney disease    acute renal failure in February admission   Chronic pain    Complication of anesthesia    difficulty waking up   Depression    Diabetes mellitus    not consistent on taking medications   Gout    History of blood clots 1986 or 87   blood clot in groin?   History of headache    History of hepatitis C    competed treatment   Hyperlipidemia    Hypertension    Takes prinzide daily   Neuromuscular disorder ([place])    gout and neuropathy   Peripheral vascular disease ([place])    Varicose veins    Vein, varicose    Wound check, dressing change    dressing changes 3x/week; open wound    Past Surgical History:  Procedure Laterality Date   ABDOMINAL AORTAGRAM N/A 11/17/2011   Procedure: ABDOMINAL AORTAGRAM;  Surgeon: Rosetta Posner, MD;  Location: [facility] CATH LAB;  Service: Cardiovascular;  Laterality: N/A;   FEMORAL-POPLITEAL BYPASS GRAFT  11/24/2011   Procedure: BYPASS GRAFT FEMORAL-POPLITEAL ARTERY;  Surgeon: Rosetta Posner, MD;  Location: [facility];  Service: Vascular;  Laterality: Right;  HEMORRHOID SURGERY     VARICOSE VEIN SURGERY     VASCULAR SURGERY  1980s   left leg    MEDICATIONS:  albuterol (VENTOLIN HFA) 108 (90 Base) MCG/ACT inhaler   Alcohol Swabs (B-D SINGLE USE SWABS REGULAR) PADS   allopurinol (ZYLOPRIM) 300 MG tablet   amitriptyline (ELAVIL) 25 MG tablet   aspirin 81 MG EC tablet   atorvastatin (LIPITOR) 40 MG tablet   Blood Glucose Calibration (OT ULTRA/FASTTK CNTRL SOLN) SOLN   Blood Glucose Monitoring Suppl (ONE TOUCH ULTRA 2) w/Device KIT   colchicine 0.6 MG tablet   Continuous Blood Gluc Receiver (FREESTYLE LIBRE 14 DAY READER) DEVI   Ferrous Sulfate (IRON) 325 (65 FE) MG TABS   furosemide (LASIX) 20 MG tablet   gabapentin (NEURONTIN) 800 MG tablet   glucose blood test strip   hydrochlorothiazide (HYDRODIURIL) 12.5 MG tablet   Lancet Devices (EASY MINI EJECT LANCING DEVICE) MISC   lidocaine  (XYLOCAINE) 5 % ointment   losartan (COZAAR) 25 MG tablet   metFORMIN (GLUCOPHAGE-XR) 500 MG 24 hr tablet   metoprolol succinate (TOPROL-XL) 25 MG 24 hr tablet   NARCAN 4 MG/0.1ML LIQD nasal spray kit   ONETOUCH DELICA LANCETS 51I MISC   predniSONE (DELTASONE) 10 MG tablet   pregabalin (LYRICA) 300 MG capsule   QUEtiapine (SEROQUEL) 100 MG tablet   sitaGLIPtin (JANUVIA) 100 MG tablet   vitamin B-12 (CYANOCOBALAMIN) 1000 MCG tablet   No current facility-administered medications for this encounter.    If no changes, I anticipate pt can proceed with surgery as scheduled.   Willeen Cass, PhD, FNP-BC [facility] - Inpatient Short Stay Surgical Center/Anesthesiology Phone: [phone] 07/08/2021 3:06 PM

## 2021-07-08 NOTE — Anesthesia Preprocedure Evaluation (Deleted)
Anesthesia Evaluation  Patient identified by MRN, date of birth, ID band Patient awake    Reviewed: Allergy & Precautions, NPO status , Patient's Chart, lab work & pertinent test results  Airway Mallampati: II  TM Distance: >3 FB Neck ROM: Full    Dental no notable dental hx.    Pulmonary sleep apnea , former smoker,    Pulmonary exam normal breath sounds clear to auscultation       Cardiovascular hypertension, negative cardio ROS Normal cardiovascular exam Rhythm:Regular Rate:Normal     Neuro/Psych Anxiety negative neurological ROS  negative psych ROS   GI/Hepatic Neg liver ROS, GERD  ,  Endo/Other  negative endocrine ROSdiabetes, Type 2  Renal/GU negative Renal ROS  negative genitourinary   Musculoskeletal  (+) Arthritis , Osteoarthritis,    Abdominal   Peds negative pediatric ROS (+)  Hematology negative hematology ROS (+)   Anesthesia Other Findings   Reproductive/Obstetrics negative OB ROS                            Anesthesia Physical Anesthesia Plan  ASA: 2  Anesthesia Plan: General   Post-op Pain Management: Dilaudid IV   Induction: Intravenous  PONV Risk Score and Plan: 2 and Ondansetron, Midazolam and Treatment may vary due to age or medical condition  Airway Management Planned: Oral ETT  Additional Equipment:   Intra-op Plan:   Post-operative Plan: Extubation in OR  Informed Consent: I have reviewed the patients History and Physical, chart, labs and discussed the procedure including the risks, benefits and alternatives for the proposed anesthesia with the patient or authorized representative who has indicated his/her understanding and acceptance.     Dental advisory given  Plan Discussed with: CRNA  Anesthesia Plan Comments: (See APP note by A. Gabriell Casimir, FNP )       Anesthesia Quick Evaluation  

## 2021-07-14 ENCOUNTER — Ambulatory Visit (INDEPENDENT_AMBULATORY_CARE_PROVIDER_SITE_OTHER): Payer: Medicare Other

## 2021-07-14 ENCOUNTER — Other Ambulatory Visit: Payer: Self-pay

## 2021-07-14 DIAGNOSIS — L97511 Non-pressure chronic ulcer of other part of right foot limited to breakdown of skin: Secondary | ICD-10-CM

## 2021-07-14 DIAGNOSIS — E1142 Type 2 diabetes mellitus with diabetic polyneuropathy: Secondary | ICD-10-CM | POA: Diagnosis not present

## 2021-07-14 DIAGNOSIS — L97521 Non-pressure chronic ulcer of other part of left foot limited to breakdown of skin: Secondary | ICD-10-CM | POA: Diagnosis not present

## 2021-07-14 NOTE — Progress Notes (Signed)
SITUATION Reason for Visit: Fitting of Diabetic Shoes & Insoles Patient / Caregiver Report:  Patient reports comfort  OBJECTIVE DATA: Patient History / Diagnosis:     ICD-10-CM   1. Diabetic polyneuropathy associated with type 2 diabetes mellitus (HCC)  E11.42     2. Ulcerated, foot, right, limited to breakdown of skin ([place])  L97.511       Change in Status:   None  ACTIONS PERFORMED: In-Person Delivery, patient was fit with: - 1x pair A5500 PDAC approved prefabricated Diabetic Shoes: Apex X801M 14W - 3x pair [id number] PDAC approved CAM milled custom diabetic insoles [employer] and insoles were verified for structural integrity and safety. Patient wore shoes and insoles in office. Skin was inspected and free of areas of concern after wearing shoes and inserts. Shoes and inserts fit properly. Patient / Caregiver provided with ferbal instruction and demonstration regarding donning, doffing, wear, care, proper fit, function, purpose, cleaning, and use of shoes and insoles ' and in all related precautions and risks and benefits regarding shoes and insoles. Patient / Caregiver was instructed to wear properly fitting socks with shoes at all times. Patient was also provided with verbal instruction regarding how to report any failures or malfunctions of shoes or inserts, and necessary follow up care. Patient / Caregiver was also instructed to contact physician regarding change in status that may affect function of shoes and inserts.   Patient / Caregiver verbalized undersatnding of instruction provided. Patient / Caregiver demonstrated independence with proper donning and doffing of shoes and inserts.  PLAN Patient to follow up as needed. Plan of care was discussed with and agreed upon by patient and/or caregiver. All questions were answered and concerns addressed.

## 2021-07-20 ENCOUNTER — Ambulatory Visit (HOSPITAL_COMMUNITY)
Admission: RE | Admit: 2021-07-20 | Discharge: 2021-07-20 | Disposition: A | Discharge status: Home / Self Care | Payer: Medicare Other | Source: Ambulatory Visit | Attending: Podiatry | Admitting: Podiatry

## 2021-07-20 ENCOUNTER — Other Ambulatory Visit: Payer: Self-pay

## 2021-07-20 DIAGNOSIS — I739 Peripheral vascular disease, unspecified: Secondary | ICD-10-CM | POA: Diagnosis present

## 2021-07-20 NOTE — Progress Notes (Incomplete)
{  Select Note:[id number]} 

## 2021-07-22 ENCOUNTER — Other Ambulatory Visit: Payer: Self-pay | Admitting: Podiatry

## 2021-07-22 DIAGNOSIS — I739 Peripheral vascular disease, unspecified: Secondary | ICD-10-CM

## 2021-07-25 ENCOUNTER — Encounter: Payer: Medicare Other | Admitting: Podiatry

## 2021-07-25 ENCOUNTER — Telehealth: Payer: Self-pay

## 2021-07-25 NOTE — Telephone Encounter (Signed)
Message below from Dr. Ardelle [name] via Earleen Reaper to [name] [name] afternoon. I just tried to give you a call to go over the test results but your mailbox was full and could not leave a voicemail. The test does show a change in your circulation on the right leg compared to the last study that was done. The circulation is decreased. I am fearful that by causing a wound on the foot with the surgery it has a higher chance of it not healing. I am putting in a referral to vascular surgery to have you evaluated which needs to be done prior to surgery. I am not sure it can be done prior to surgery on Wednesday. If not, we are going to have to delay the surgery until we get this taken care of. I want to do what is the best for you and and not cause other issues. Please let me know if you have any other questions.    Dr. Ardelle [name]   I spoke to [name] [name] and informed him of Dr. Mason [name] message. I told him if someone hasn't called him from [facility] by the end of the week to let me know. We have cancelled his surgery for 07/27/2021 and will get it rescheduled as soon as he lets me know of the [facility] appointment. He stated he understood and will let me know.

## 2021-07-27 ENCOUNTER — Ambulatory Visit (HOSPITAL_COMMUNITY): Admission: RE | Admit: 2021-07-27 | Payer: Medicare Other | Source: Ambulatory Visit | Admitting: Podiatry

## 2021-07-27 ENCOUNTER — Encounter (HOSPITAL_COMMUNITY): Admission: RE | Payer: Self-pay | Source: Ambulatory Visit

## 2021-07-27 DIAGNOSIS — Z01818 Encounter for other preprocedural examination: Secondary | ICD-10-CM

## 2021-07-27 DIAGNOSIS — K759 Inflammatory liver disease, unspecified: Secondary | ICD-10-CM

## 2021-07-27 DIAGNOSIS — K769 Liver disease, unspecified: Secondary | ICD-10-CM

## 2021-07-27 DIAGNOSIS — I2510 Atherosclerotic heart disease of native coronary artery without angina pectoris: Secondary | ICD-10-CM

## 2021-07-27 SURGERY — WIDE EXCISION, LESION, UPPER EXTREMITY
Anesthesia: Choice | Laterality: Right

## 2021-08-01 ENCOUNTER — Encounter: Payer: Medicare Other | Admitting: Podiatry

## 2021-08-01 NOTE — Progress Notes (Unsigned)
[facility] [place]  ASSESSMENT / PLAN: [name] [name] is a 71 y.o. male with atherosclerosis of *** native arteries of *** causing {Chronic PAD levels:25303}.  Patient counseled {pad risk2:26283}  WIfI score calculated based on clinical exam and non-invasive measurements. {WIFIvascular:26096}  Recommend the following which can slow the progression of atherosclerosis and reduce the risk of major adverse cardiac / limb events:  Complete cessation from all tobacco products. Blood glucose control with goal A1c < 7%. Blood pressure control with goal blood pressure < 140/90 mmHg. Lipid reduction therapy with goal LDL-C <100 mg/dL (<70 if symptomatic from PAD).  Aspirin 72m PO QD.  *** Clopidogrel 771mPO QD. *** Rivaroxaban 2.59m24mO BID. *** Cilostozal 100m57m BID for intermittent claudication without evidence of heart failure. Atorvastatin 40-80mg PO QD (or other "high intensity" statin therapy). *** Daily walking to and past the point of discomfort. Patient counseled to keep a log of exercise distance. *** Adequate hydration (at least 2 liters / day) if patient's heart and kidney function is adequate.  Plan *** lower extremity angiogram with possible intervention via *** approach in cath lab ***.    CHIEF COMPLAINT: ***  HISTORY OF PRESENT ILLNESS: [name] KNIGHTONa 70 y70. male ***  VASCULAR SURGICAL HISTORY: Right femoral to above-knee popliteal with translocated nonreversed saphenous vein - 11/24/11 Early  VASCULAR RISK FACTORS: {FINDINGS; POSITIVE NEGATIVE:859-645-0765} history of stroke / transient ischemic attack. {FINDINGS; POSITIVE NEGATIVE:859-645-0765} history of coronary artery disease. *** history of PCI. *** history of CABG.  {FINDINGS; POSITIVE NEGATIVE:[id number]} history of diabetes mellitus. Last A1c ***. {FINDINGS; POSITIVE NEGATIVE:[id number]} history of smoking. *** actively smoking. {FINDINGS; POSITIVE NEGATIVE:[id number]} history of  hypertension. *** drug regimen with *** control. {FINDINGS; POSITIVE NEGATIVE:[id number]} history of chronic kidney disease.  Last GFR ***. CKD {stage:30421363}. {FINDINGS; POSITIVE NEGATIVE:[id number]} history of chronic obstructive pulmonary disease, treated with ***.  FUNCTIONAL STATUS: ECOG performance status: {findings; ecog performance status:31780} Ambulatory status: {TNHAmbulation:25868}  Past Medical History:  Diagnosis Date   Anemia    hx of blood transfusion   Arthritis    GOUT   CHF (congestive heart failure) (HCC)    Grade 1 diastolic function per PCP LOVN   Chronic kidney disease    acute renal failure in February admission   Chronic pain    Complication of anesthesia    difficulty waking up   Depression    Diabetes mellitus    not consistent on taking medications   Gout    History of blood clots 1986 or 87   blood clot in groin?   History of headache    History of hepatitis C    competed treatment   Hyperlipidemia    Hypertension    Takes prinzide daily   Neuromuscular disorder (HCC)[place] gout and neuropathy   Peripheral vascular disease (HCC)[place] Varicose veins    Vein, varicose    Wound check, dressing change    dressing changes 3x/week; open wound    Past Surgical History:  Procedure Laterality Date   ABDOMINAL AORTAGRAM N/A 11/17/2011   Procedure: ABDOMINAL AORTAGRAM;  Surgeon: ToddRosetta Posner;  [facility];  Service: Cardiovascular;  Laterality: N/A;   FEMORAL-POPLITEAL BYPASS GRAFT  11/24/2011   Procedure: BYPASS GRAFT FEMORAL-POPLITEAL ARTERY;  Surgeon: ToddRosetta Posner;  [facility];  Laterality: Right;   [place]  1980s   left leg    Family History  Problem Relation Age of Onset   Cancer Mother        liver?   Hyperlipidemia Father    Hypertension Father    Diabetes Father    Alcohol abuse Father    Arthritis Father    Kidney disease Father     Diabetes Son    Heart attack Neg Hx     Social History   Socioeconomic History   Marital status: Legally Separated    Spouse name: Not on file   Number of children: 5   Years of education: Not on file   Highest education level: Not on file  Occupational History   Not on file  Tobacco Use   Smoking status: Former    Packs/day: 0.25    Years: 35.00    Pack years: 8.75    Types: Cigars, Cigarettes    Quit date: 12/07/2014    Years since quitting: 6.6   Smokeless tobacco: Never   Tobacco comments:    3 cigars a day  Vaping Use   Vaping Use: Never used  Substance and Sexual Activity   Alcohol use: No    Comment: none in 20 years   Drug use: No   Sexual activity: Not Currently  Other Topics Concern   Not on file  Social History Narrative   Regular exercise:  Walks a little every day.   Lives with 26 yr old son.   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   Quit smoking few months ago.   Goes to [facility]- drug free since 1994.   Former Etoh abuse.   Social Determinants of Health   Financial Resource Strain: Not on file  Food Insecurity: Not on file  Transportation Needs: Not on file  Physical Activity: Not on file  Stress: Not on file  Social Connections: Not on file  Intimate Partner Violence: Not on file    Allergies  Allergen Reactions   Poractant Alfa Anaphylaxis   Pork-Derived Products Nausea And Vomiting    Extremely sick   Bactrim [Sulfamethoxazole-Trimethoprim] Other (See Comments)    Blisters   Lisinopril     Hyperkalemia    Acetaminophen Nausea And Vomiting    Current Outpatient Medications  Medication Sig Dispense Refill   albuterol (VENTOLIN HFA) 108 (90 Base) MCG/ACT inhaler Inhale 2 puffs into the lungs every 4 (four) hours as needed for wheezing or shortness of breath.     Alcohol Swabs (B-D SINGLE USE SWABS REGULAR) PADS Use to check blood sugar once a day. Dx E11.91 100 each 1   allopurinol (ZYLOPRIM) 300  MG tablet Take 300 mg by mouth daily.  6   amitriptyline (ELAVIL) 25 MG tablet TAKE 2-3 TABLETS BY MOUTH DAILY AT BEDTIME AS DIRECTED. 90 tablet 5   aspirin 81 MG EC tablet Take 1 tablet by mouth daily.     atorvastatin (LIPITOR) 40 MG tablet Take 1 tablet (40 mg total) by mouth daily. 90 tablet 1   Blood Glucose Calibration (OT ULTRA/FASTTK CNTRL SOLN) SOLN See admin instructions.     Blood Glucose Monitoring Suppl (ONE TOUCH ULTRA 2) w/Device KIT USE TO CHECK BLOOD SUGAR ONCE A DAY. DX E11.9 1 each 0   colchicine 0.6 MG tablet IF GOUT FLARE TAKE 2 TABLETS FOLLOWED BY 1 TABLET 1 HOUR LATER. THEN RESUME ONE TABLET DAILY. (Patient not taking: Reported on 07/07/2021) 120 tablet 1   Continuous Blood Gluc  Receiver (FREESTYLE LIBRE 14 DAY READER) DEVI Use to check BS twice a day/ E11.42     Ferrous Sulfate (IRON) 325 (65 FE) MG TABS Take 1 tablet by mouth daily. 30 each 2   furosemide (LASIX) 20 MG tablet Take 1 tablet (20 mg total) by mouth daily. 90 tablet 1   gabapentin (NEURONTIN) 800 MG tablet TAKE ONE TABLET BY MOUTH 3 TIMES DAILY(AM,NOON,EVE) 270 tablet 1   glucose blood test strip ONE TOUCH ULTRA. Use as instructed to check blood sugar once a day.  DX.. E11.9 100 each 1   hydrochlorothiazide (HYDRODIURIL) 12.5 MG tablet Take 12.5 mg by mouth daily.     Lancet Devices (EASY MINI EJECT LANCING DEVICE) MISC 2 (two) times daily.     lidocaine (XYLOCAINE) 5 % ointment APPLY 1 GRAM TOPICALLY TO THE AFFECTED AREA FOUR TIMES DAILY (Patient not taking: Reported on 07/07/2021) 35.44 g 1   losartan (COZAAR) 25 MG tablet TAKE ONE TABLET BY MOUTH ONCE DAILY. 90 tablet 1   metFORMIN (GLUCOPHAGE-XR) 500 MG 24 hr tablet Take 2 tablets (1,000 mg total) by mouth daily with breakfast. (Patient taking differently: Take 500 mg by mouth 2 (two) times daily.) 180 tablet 1   metoprolol succinate (TOPROL-XL) 25 MG 24 hr tablet Take 1 tablet (25 mg total) by mouth daily. 90 tablet 3   NARCAN 4 MG/0.1ML LIQD nasal spray kit 1  - 2 PUFFS AS DIRECTED IN CASE OF ACCIDENTAL OVER DOSE OF PAIN MEDICATIONS  0   ONETOUCH DELICA LANCETS 68T MISC Use to check blood sugar once a day.  Dx  E11.9 100 each 1   predniSONE (DELTASONE) 10 MG tablet Take 10 mg by mouth daily as needed (Gout).     pregabalin (LYRICA) 300 MG capsule Take 1 capsule (300 mg total) by mouth 2 (two) times daily. (Patient not taking: Reported on 07/07/2021) 60 capsule 0   QUEtiapine (SEROQUEL) 100 MG tablet Take 100 mg by mouth at bedtime.     sitaGLIPtin (JANUVIA) 100 MG tablet Take 1 tablet (100 mg total) by mouth daily. (Patient taking differently: Take 50 mg by mouth daily.) 90 tablet 1   vitamin B-12 (CYANOCOBALAMIN) 1000 MCG tablet Take 1,000 mcg by mouth daily.     No current facility-administered medications for this visit.    PHYSICAL EXAM There were no vitals filed for this visit.  Constitutional: *** appearing. *** distress. Appears *** nourished.  Neurologic: CN ***. *** focal findings. *** sensory loss. Psychiatric: *** Mood and affect symmetric and appropriate. Eyes: *** No icterus. No conjunctival pallor. Ears, nose, throat: *** mucous membranes moist. Midline trachea.  Cardiac: *** rate and rhythm.  Respiratory: *** unlabored. Abdominal: *** soft, non-tender, non-distended.  Peripheral vascular: *** Extremity: *** edema. *** cyanosis. *** pallor.  Skin: *** gangrene. *** ulceration.  Lymphatic: *** Stemmer's sign. *** palpable lymphadenopathy.  PERTINENT LABORATORY AND RADIOLOGIC DATA  Most recent CBC CBC Latest Ref Rng & Units 06/23/2021 12/20/2015 03/24/2015  WBC 3.8 - 10.8 Thousand/uL 8.7 7.1 9.2  Hemoglobin 13.2 - 17.1 g/dL 12.5(L) 13.9 12.6(L)  Hematocrit 38.5 - 50.0 % 38.0(L) 41.3 37.5(L)  Platelets 140 - 400 Thousand/uL 240 130.0(L) 171     Most recent CMP CMP Latest Ref Rng & Units 06/23/2021 10/24/2017 02/02/2016  Glucose 65 - 99 mg/dL 98 - 115(H)  BUN 7 - 25 mg/dL 15 - 14  Creatinine 0.70 - 1.35 mg/dL 1.18 1.20 1.09   Sodium 135 - 146 mmol/L 139 - 138  Potassium  3.5 - 5.3 mmol/L 4.3 - 4.6  Chloride 98 - 110 mmol/L 104 - 108  CO2 20 - 32 mmol/L 28 - 27  Calcium 8.6 - 10.3 mg/dL 9.2 - 8.5  Total Protein 6.5 - 8.1 g/dL - - -  Total Bilirubin 0.3 - 1.2 mg/dL - - -  Alkaline Phos 38 - 126 U/L - - -  AST 15 - 41 U/L - - -  ALT 17 - 63 U/L - - -    Renal function CrCl cannot be calculated (Patient's most recent lab result is older than the maximum 21 days allowed.).  Hgb A1c MFr Bld (% of total Hgb)  Date Value  06/23/2021 6.4 (H)    LDL Cholesterol  Date Value Ref Range Status  08/13/2015 50 0 - 99 mg/dL Final   Direct LDL  Date Value Ref Range Status  11/30/2014 118.0 mg/dL Final    Comment:    Optimal:  <100 mg/dLNear or Above Optimal:  100-129 mg/dLBorderline High:  130-159 mg/dLHigh:  160-189 mg/dLVery High:  >190 mg/dL      +-------+-----------+-----------+------------+------------+   ABI/TBI Today's ABI Today's TBI Previous ABI Previous TBI   +-------+-----------+-----------+------------+------------+   Right   0.84        0.43                                    +-------+-----------+-----------+------------+------------+   Left    0.97        0.44                                    +-------+-----------+-----------+------------+------------+   Yevonne Aline. Stanford Breed, MD [facility] Phone Number: [phone] 08/01/2021 5:00 PM  Total time spent on preparing this encounter including chart review, data review, collecting history, examining the patient, coordinating care for this {tnhtimebilling:26202}  Portions of this report may have been transcribed using voice recognition software.  Every effort has been made to ensure accuracy; however, inadvertent computerized transcription errors may still be present.

## 2021-08-02 ENCOUNTER — Encounter: Payer: Medicare Other | Admitting: Vascular Surgery

## 2021-08-04 ENCOUNTER — Encounter: Payer: Medicare Other | Admitting: Podiatry

## 2021-08-11 ENCOUNTER — Encounter: Payer: Medicare Other | Admitting: Podiatry

## 2021-08-12 ENCOUNTER — Encounter: Payer: Self-pay | Admitting: Surgery

## 2021-08-12 ENCOUNTER — Other Ambulatory Visit: Payer: Self-pay

## 2021-08-12 ENCOUNTER — Ambulatory Visit (INDEPENDENT_AMBULATORY_CARE_PROVIDER_SITE_OTHER): Payer: Medicare Other | Admitting: Surgery

## 2021-08-12 VITALS — BP 131/79 | HR 81 | Temp 98.1°F | Resp 20 | Ht 72.0 in | Wt 257.0 lb

## 2021-08-12 DIAGNOSIS — E08621 Diabetes mellitus due to underlying condition with foot ulcer: Secondary | ICD-10-CM | POA: Diagnosis not present

## 2021-08-12 DIAGNOSIS — L97401 Non-pressure chronic ulcer of unspecified heel and midfoot limited to breakdown of skin: Secondary | ICD-10-CM | POA: Diagnosis not present

## 2021-08-12 NOTE — H&P (View-Only) (Signed)
? ?[facility] [place] ? ?Patient name: [name] [name] MRN: [id number] DOB: 1950-10-16 Sex: male ? ? ?REQUESTING PROVIDER:  ? ? Dr. Jacqualyn Posey ? ? ?REASON FOR CONSULT:  ?  ?ulcer ? ?HISTORY OF PRESENT ILLNESS:  ? ?[name] [name] is a 71 y.o. male, who is referred for evaluation of lower extremity atherosclerotic disease.  Patient has been having significant pain in his right foot for many years.  He also has a history of a wound in that area with callus formation, requiring frequent debridement.  He is being considered for surgical intervention.  Arterial evaluation has been requested prior to procedure. ? ?The patient has a history of poorly controlled diabetes.  History of chronic renal sufficiency.  Left grade 1 diastolic congestive heart failure he takes a statin hypercholesterolemia.  He is on ARB for hypertension.  History of smoking. ? ?PAST MEDICAL HISTORY  ? ? ?Past Medical History:  ?Diagnosis Date  ? Anemia   ? hx of blood transfusion  ? Arthritis   ? GOUT  ? CHF (congestive heart failure) ([place])   ? Grade 1 diastolic function per PCP LOVN  ? Chronic kidney disease   ? acute renal failure in February admission  ? Chronic pain   ? Complication of anesthesia   ? difficulty waking up  ? Depression   ? Diabetes mellitus   ? not consistent on taking medications  ? Gout   ? History of blood clots 1986 or 87  ? blood clot in groin?  ? History of headache   ? History of hepatitis C   ? competed treatment  ? Hyperlipidemia   ? Hypertension   ? Takes prinzide daily  ? Neuromuscular disorder ([place])   ? gout and neuropathy  ? Peripheral vascular disease ([place])   ? Varicose veins   ? Vein, varicose   ? Wound check, dressing change   ? dressing changes 3x/week; open wound  ? ? ? ?FAMILY HISTORY  ? ?Family History  ?Problem Relation Age of Onset  ? Cancer Mother   ?     liver?  ? Hyperlipidemia Father   ? Hypertension Father   ? Diabetes Father   ? Alcohol abuse Father    ? Arthritis Father   ? Kidney disease Father   ? Diabetes Son   ? Heart attack Neg Hx   ? ? ?SOCIAL HISTORY:  ? ?Social History  ? ?Socioeconomic History  ? Marital status: Legally Separated  ?  Spouse name: Not on file  ? Number of children: 5  ? Years of education: Not on file  ? Highest education level: Not on file  ?Occupational History  ? Not on file  ?Tobacco Use  ? Smoking status: Former  ?  Packs/day: 0.25  ?  Years: 35.00  ?  Pack years: 8.75  ?  Types: Cigars, Cigarettes  ?  Quit date: 12/07/2014  ?  Years since quitting: 6.6  ? Smokeless tobacco: Never  ? Tobacco comments:  ?  3 cigars a day  ?Vaping Use  ? Vaping Use: Never used  ?Substance and Sexual Activity  ? Alcohol use: No  ?  Comment: none in 20 years  ? Drug use: No  ? Sexual activity: Not Currently  ?Other Topics Concern  ? Not on file  ?Social History Narrative  ? Regular exercise:  Walks a little every day.  ? Lives with 3 yr old son.  ? Associated degree social services  ?  Working on Building services engineer.  ? Quit smoking few months ago.  ? [facility]es to [facility]- drug free since 1994.  ? Former Etoh abuse.  ? ?Social Determinants of Health  ? ?Financial Resource Strain: Not on file  ?Food Insecurity: Not on file  ?Transportation Needs: Not on file  ?Physical Activity: Not on file  ?Stress: Not on file  ?Social Connections: Not on file  ?Intimate Partner Violence: Not on file  ? ? ?ALLERGIES:  ? ? ?Allergies  ?Allergen Reactions  ? Poractant Alfa Anaphylaxis  ? Pork-Derived Products Nausea And Vomiting  ?  Extremely sick  ? Bactrim [Sulfamethoxazole-Trimethoprim] Other (See Comments)  ?  Blisters  ? Lisinopril   ?  Hyperkalemia ?  ? Acetaminophen Nausea And Vomiting  ? ? ?CURRENT MEDICATIONS:  ? ? ?Current Outpatient Medications  ?Medication Sig Dispense Refill  ? albuterol (VENTOLIN HFA) 108 (90 Base) MCG/ACT inhaler Inhale 2 puffs into the lungs every 4 (four) hours as needed for wheezing or shortness of breath.    ? Alcohol Swabs (B-D SINGLE  USE SWABS REGULAR) PADS Use to check blood sugar once a day. Dx E11.91 100 each 1  ? allopurinol (ZYLOPRIM) 300 MG tablet Take 300 mg by mouth daily.  6  ? amitriptyline (ELAVIL) 25 MG tablet TAKE 2-3 TABLETS BY MOUTH DAILY AT BEDTIME AS DIRECTED. 90 tablet 5  ? aspirin 81 MG EC tablet Take 1 tablet by mouth daily.    ? atorvastatin (LIPITOR) 40 MG tablet Take 1 tablet (40 mg total) by mouth daily. 90 tablet 1  ? Blood Glucose Calibration (OT ULTRA/FASTTK CNTRL SOLN) SOLN See admin instructions.    ? Blood Glucose Monitoring Suppl (ONE TOUCH ULTRA 2) w/Device KIT USE TO CHECK BLOOD SUGAR ONCE A DAY. DX E11.9 1 each 0  ? colchicine 0.6 MG tablet IF GOUT FLARE TAKE 2 TABLETS FOLLOWED BY 1 TABLET 1 HOUR LATER. THEN RESUME ONE TABLET DAILY. 120 tablet 1  ? Continuous Blood Gluc Receiver (FREESTYLE LIBRE 14 DAY READER) DEVI Use to check BS twice a day/ E11.42    ? Ferrous Sulfate (IRON) 325 (65 FE) MG TABS Take 1 tablet by mouth daily. 30 each 2  ? furosemide (LASIX) 20 MG tablet Take 1 tablet (20 mg total) by mouth daily. 90 tablet 1  ? gabapentin (NEURONTIN) 800 MG tablet TAKE ONE TABLET BY MOUTH 3 TIMES DAILY(AM,NOON,EVE) 270 tablet 1  ? glucose blood test strip ONE TOUCH ULTRA. Use as instructed to check blood sugar once a day.  DX.. E11.9 100 each 1  ? hydrochlorothiazide (HYDRODIURIL) 12.5 MG tablet Take 12.5 mg by mouth daily.    ? Lancet Devices (EASY MINI EJECT LANCING DEVICE) MISC 2 (two) times daily.    ? lidocaine (XYLOCAINE) 5 % ointment APPLY 1 GRAM TOPICALLY TO THE AFFECTED AREA FOUR TIMES DAILY 35.44 g 1  ? losartan (COZAAR) 25 MG tablet TAKE ONE TABLET BY MOUTH ONCE DAILY. 90 tablet 1  ? metFORMIN (GLUCOPHAGE-XR) 500 MG 24 hr tablet Take 2 tablets (1,000 mg total) by mouth daily with breakfast. (Patient taking differently: Take 500 mg by mouth 2 (two) times daily.) 180 tablet 1  ? metoprolol succinate (TOPROL-XL) 25 MG 24 hr tablet Take 1 tablet (25 mg total) by mouth daily. 90 tablet 3  ? NARCAN 4  MG/0.1ML LIQD nasal spray kit 1 - 2 PUFFS AS DIRECTED IN CASE OF ACCIDENTAL OVER DOSE OF PAIN MEDICATIONS  0  ? ONETOUCH DELICA LANCETS 26E MISC Use  to check blood sugar once a day.  Dx  E11.9 100 each 1  ? predniSONE (DELTASONE) 10 MG tablet Take 10 mg by mouth daily as needed (Gout).    ? pregabalin (LYRICA) 300 MG capsule Take 1 capsule (300 mg total) by mouth 2 (two) times daily. 60 capsule 0  ? QUEtiapine (SEROQUEL) 100 MG tablet Take 100 mg by mouth at bedtime.    ? sitaGLIPtin (JANUVIA) 100 MG tablet Take 1 tablet (100 mg total) by mouth daily. (Patient taking differently: Take 50 mg by mouth daily.) 90 tablet 1  ? vitamin B-12 (CYANOCOBALAMIN) 1000 MCG tablet Take 1,000 mcg by mouth daily.    ? ?No current facility-administered medications for this visit.  ? ? ?REVIEW OF SYSTEMS:  ? ?_0  denotes positive finding, _1  denotes negative finding ?Cardiac  Comments:  ?Chest pain or chest pressure:    ?Shortness of breath upon exertion:    ?Short of breath when lying flat:    ?Irregular heart rhythm:    ?    ?Vascular    ?Pain in calf, thigh, or hip brought on by ambulation:    ?Pain in feet at night that wakes you up from your sleep:     ?Blood clot in your veins:    ?Leg swelling:     ?    ?Pulmonary    ?Oxygen at home:    ?Productive cough:     ?Wheezing:     ?    ?Neurologic    ?Sudden weakness in arms or legs:     ?Sudden numbness in arms or legs:     ?Sudden onset of difficulty speaking or slurred speech:    ?Temporary loss of vision in one eye:     ?Problems with dizziness:     ?    ?Gastrointestinal    ?Blood in stool:     ? ?Vomited blood:     ?    ?Genitourinary    ?Burning when urinating:     ?Blood in urine:    ?    ?Psychiatric    ?Major depression:     ?    ?Hematologic    ?Bleeding problems:    ?Problems with blood clotting too easily:    ?    ?Skin    ?Rashes or ulcers:    ?    ?Constitutional    ?Fever or chills:    ? ?PHYSICAL EXAM:  ? ?Vitals:  ? 08/12/21 1108  ?BP: 131/79  ?Pulse: 81  ?Resp: 20   ?Temp: 98.1 ?F (36.7 ?C)  ?SpO2: 92%  ?Weight: 257 lb (116.6 kg)  ?Height: 6' (1.829 m)  ? ? ?GENERAL: The patient is a well-nourished male, in no acute distress. The vital signs are documented above. ?C

## 2021-08-12 NOTE — Progress Notes (Signed)
[facility] [place]  Patient name: [name] [name] MRN: [id number] DOB: 1950-12-30 Sex: male   REQUESTING PROVIDER:    Dr. Ardelle Anton   REASON FOR CONSULT:    ulcer  HISTORY OF PRESENT ILLNESS:   [name] [name] is a 71 y.o. male, who is referred for evaluation of lower extremity atherosclerotic disease.  Patient has been having significant pain in his right foot for many years.  He also has a history of a wound in that area with callus formation, requiring frequent debridement.  He is being considered for surgical intervention.  Arterial evaluation has been requested prior to procedure.  The patient has a history of poorly controlled diabetes.  History of chronic renal sufficiency.  Left grade 1 diastolic congestive heart failure he takes a statin hypercholesterolemia.  He is on ARB for hypertension.  History of smoking.  PAST MEDICAL HISTORY    Past Medical History:  Diagnosis Date   Anemia    hx of blood transfusion   Arthritis    GOUT   CHF (congestive heart failure) (HCC)    Grade 1 diastolic function per PCP LOVN   Chronic kidney disease    acute renal failure in February admission   Chronic pain    Complication of anesthesia    difficulty waking up   Depression    Diabetes mellitus    not consistent on taking medications   Gout    History of blood clots 1986 or 87   blood clot in groin?   History of headache    History of hepatitis C    competed treatment   Hyperlipidemia    Hypertension    Takes prinzide daily   Neuromuscular disorder (HCC)    gout and neuropathy   Peripheral vascular disease (HCC)    Varicose veins    Vein, varicose    Wound check, dressing change    dressing changes 3x/week; open wound     FAMILY HISTORY   Family History  Problem Relation Age of Onset   Cancer Mother        liver?   Hyperlipidemia Father    Hypertension Father    Diabetes Father    Alcohol abuse Father     Arthritis Father    Kidney disease Father    Diabetes Son    Heart attack Neg Hx     SOCIAL HISTORY:   Social History   Socioeconomic History   Marital status: Legally Separated    Spouse name: Not on file   Number of children: 5   Years of education: Not on file   Highest education level: Not on file  Occupational History   Not on file  Tobacco Use   Smoking status: Former    Packs/day: 0.25    Years: 35.00    Pack years: 8.75    Types: Cigars, Cigarettes    Quit date: 12/07/2014    Years since quitting: 6.6   Smokeless tobacco: Never   Tobacco comments:    3 cigars a day  Vaping Use   Vaping Use: Never used  Substance and Sexual Activity   Alcohol use: No    Comment: none in 20 years   Drug use: No   Sexual activity: Not Currently  Other Topics Concern   Not on file  Social History Narrative   Regular exercise:  Walks a little every day.   Lives with 17 yr old son.   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   Quit smoking few months ago.   Goes to [facility]- drug free since 1994.   Former Etoh abuse.   Social Determinants of Health   Financial Resource Strain: Not on file  Food Insecurity: Not on file  Transportation Needs: Not on file  Physical Activity: Not on file  Stress: Not on file  Social Connections: Not on file  Intimate Partner Violence: Not on file    ALLERGIES:    Allergies  Allergen Reactions   Poractant Alfa Anaphylaxis   Pork-Derived Products Nausea And Vomiting    Extremely sick   Bactrim [Sulfamethoxazole-Trimethoprim] Other (See Comments)    Blisters   Lisinopril     Hyperkalemia    Acetaminophen Nausea And Vomiting    CURRENT MEDICATIONS:    Current Outpatient Medications  Medication Sig Dispense Refill   albuterol (VENTOLIN HFA) 108 (90 Base) MCG/ACT inhaler Inhale 2 puffs into the lungs every 4 (four) hours as needed for wheezing or shortness of breath.     Alcohol Swabs (B-D SINGLE  USE SWABS REGULAR) PADS Use to check blood sugar once a day. Dx E11.91 100 each 1   allopurinol (ZYLOPRIM) 300 MG tablet Take 300 mg by mouth daily.  6   amitriptyline (ELAVIL) 25 MG tablet TAKE 2-3 TABLETS BY MOUTH DAILY AT BEDTIME AS DIRECTED. 90 tablet 5   aspirin 81 MG EC tablet Take 1 tablet by mouth daily.     atorvastatin (LIPITOR) 40 MG tablet Take 1 tablet (40 mg total) by mouth daily. 90 tablet 1   Blood Glucose Calibration (OT ULTRA/FASTTK CNTRL SOLN) SOLN See admin instructions.     Blood Glucose Monitoring Suppl (ONE TOUCH ULTRA 2) w/Device KIT USE TO CHECK BLOOD SUGAR ONCE A DAY. DX E11.9 1 each 0   colchicine 0.6 MG tablet IF GOUT FLARE TAKE 2 TABLETS FOLLOWED BY 1 TABLET 1 HOUR LATER. THEN RESUME ONE TABLET DAILY. 120 tablet 1   Continuous Blood Gluc Receiver (FREESTYLE LIBRE 14 DAY READER) DEVI Use to check BS twice a day/ E11.42     Ferrous Sulfate (IRON) 325 (65 FE) MG TABS Take 1 tablet by mouth daily. 30 each 2   furosemide (LASIX) 20 MG tablet Take 1 tablet (20 mg total) by mouth daily. 90 tablet 1   gabapentin (NEURONTIN) 800 MG tablet TAKE ONE TABLET BY MOUTH 3 TIMES DAILY(AM,NOON,EVE) 270 tablet 1   glucose blood test strip ONE TOUCH ULTRA. Use as instructed to check blood sugar once a day.  DX.. E11.9 100 each 1   hydrochlorothiazide (HYDRODIURIL) 12.5 MG tablet Take 12.5 mg by mouth daily.     Lancet Devices (EASY MINI EJECT LANCING DEVICE) MISC 2 (two) times daily.     lidocaine (XYLOCAINE) 5 % ointment APPLY 1 GRAM TOPICALLY TO THE AFFECTED AREA FOUR TIMES DAILY 35.44 g 1   losartan (COZAAR) 25 MG tablet TAKE ONE TABLET BY MOUTH ONCE DAILY. 90 tablet 1   metFORMIN (GLUCOPHAGE-XR) 500 MG 24 hr tablet Take 2 tablets (1,000 mg total) by mouth daily with breakfast. (Patient taking differently: Take 500 mg by mouth 2 (two) times daily.) 180 tablet 1   metoprolol succinate (TOPROL-XL) 25 MG 24 hr tablet Take 1 tablet (25 mg total) by mouth daily. 90 tablet 3   NARCAN 4  MG/0.1ML LIQD nasal spray kit 1 - 2 PUFFS AS DIRECTED IN CASE OF ACCIDENTAL OVER DOSE OF PAIN MEDICATIONS  0   ONETOUCH DELICA LANCETS 32K MISC Use  to check blood sugar once a day.  Dx  E11.9 100 each 1   predniSONE (DELTASONE) 10 MG tablet Take 10 mg by mouth daily as needed (Gout).     pregabalin (LYRICA) 300 MG capsule Take 1 capsule (300 mg total) by mouth 2 (two) times daily. 60 capsule 0   QUEtiapine (SEROQUEL) 100 MG tablet Take 100 mg by mouth at bedtime.     sitaGLIPtin (JANUVIA) 100 MG tablet Take 1 tablet (100 mg total) by mouth daily. (Patient taking differently: Take 50 mg by mouth daily.) 90 tablet 1   vitamin B-12 (CYANOCOBALAMIN) 1000 MCG tablet Take 1,000 mcg by mouth daily.     No current facility-administered medications for this visit.    REVIEW OF SYSTEMS:   '[X]'$  denotes positive finding, $RemoveBeforeDEI'[ ]'[id number]$  denotes negative finding Cardiac  Comments:  Chest pain or chest pressure:    Shortness of breath upon exertion:    Short of breath when lying flat:    Irregular heart rhythm:        Vascular    Pain in calf, thigh, or hip brought on by ambulation:    Pain in feet at night that wakes you up from your sleep:     Blood clot in your veins:    Leg swelling:         Pulmonary    Oxygen at home:    Productive cough:     Wheezing:         Neurologic    Sudden weakness in arms or legs:     Sudden numbness in arms or legs:     Sudden onset of difficulty speaking or slurred speech:    Temporary loss of vision in one eye:     Problems with dizziness:         Gastrointestinal    Blood in stool:      Vomited blood:         Genitourinary    Burning when urinating:     Blood in urine:        Psychiatric    Major depression:         Hematologic    Bleeding problems:    Problems with blood clotting too easily:        Skin    Rashes or ulcers:        Constitutional    Fever or chills:     PHYSICAL EXAM:   Vitals:   08/12/21 1108  BP: 131/79  Pulse: 81  Resp: 20   Temp: 98.1 F (36.7 C)  SpO2: 92%  Weight: 257 lb (116.6 kg)  Height: 6' (1.829 m)    GENERAL: The patient is a well-nourished male, in no acute distress. The vital signs are documented above. CARDIAC: There is a regular rate and rhythm.  VASCULAR: Nonpalpable pedal pulses PULMONARY: Nonlabored respirations MUSCULOSKELETAL: There are no major deformities or cyanosis. NEUROLOGIC: No focal weakness or paresthesias are detected. SKIN: Dressing over top of foot wound PSYCHIATRIC: The patient has a normal affect.  STUDIES:   I have reviewed the following: +-------+-----------+-----------+------------+------------+   ABI/TBI Today's ABI Today's TBI Previous ABI Previous TBI   +-------+-----------+-----------+------------+------------+   Right   0.84        0.43                                    +-------+-----------+-----------+------------+------------+   Left  0.97        0.44                                    +-------+-----------+-----------+------------+------------+ Right toe pressure equals 63 Left toe pressure equals 64  ASSESSMENT and PLAN   Foot ulcer the patient is considering surgical debridement of the foot for chronic issues.  Arterial evaluation has been requested prior to this.  Toe pressures are consistent with an adequate circulation.  I discussed with the patient that this needs to be better evaluated with angiography.  I would do this to a left femoral approach and intervene as indicated.  I discussed with the patient that any wound on his foot could potentially be a limb threatening situation.  He understands this.  He wants to get the arteriogram done as soon as possible.  Ongoing on the schedule for Tuesday, March 14   Leia Alf, MD, [facility] [phone] Pager [phone]

## 2021-08-16 ENCOUNTER — Encounter (HOSPITAL_COMMUNITY): Admission: RE | Payer: Self-pay | Source: Home / Self Care

## 2021-08-16 ENCOUNTER — Ambulatory Visit (HOSPITAL_COMMUNITY): Admission: RE | Admit: 2021-08-16 | Payer: Medicare Other | Source: Home / Self Care | Admitting: Surgery

## 2021-08-16 SURGERY — ABDOMINAL AORTOGRAM W/LOWER EXTREMITY
Anesthesia: LOCAL

## 2021-08-18 ENCOUNTER — Encounter: Payer: Medicare Other | Admitting: Podiatry

## 2021-08-23 ENCOUNTER — Telehealth: Payer: Self-pay

## 2021-08-23 NOTE — Telephone Encounter (Signed)
[name] [name] called and wants to know if he's good to get his surgery rescheduled. Please advise ?

## 2021-08-25 ENCOUNTER — Encounter: Payer: Medicare Other | Admitting: Podiatry

## 2021-08-29 ENCOUNTER — Other Ambulatory Visit: Payer: Self-pay

## 2021-08-29 ENCOUNTER — Ambulatory Visit (INDEPENDENT_AMBULATORY_CARE_PROVIDER_SITE_OTHER): Payer: Medicare Other | Admitting: Podiatry

## 2021-08-29 DIAGNOSIS — L84 Corns and callosities: Secondary | ICD-10-CM

## 2021-08-29 DIAGNOSIS — L905 Scar conditions and fibrosis of skin: Secondary | ICD-10-CM

## 2021-08-29 DIAGNOSIS — E1142 Type 2 diabetes mellitus with diabetic polyneuropathy: Secondary | ICD-10-CM | POA: Diagnosis not present

## 2021-08-29 DIAGNOSIS — I739 Peripheral vascular disease, unspecified: Secondary | ICD-10-CM

## 2021-08-30 ENCOUNTER — Ambulatory Visit (HOSPITAL_COMMUNITY)
Admission: RE | Admit: 2021-08-30 | Discharge: 2021-08-30 | Disposition: A | Discharge status: Home / Self Care | Payer: Medicare Other | Attending: Surgery | Admitting: Surgery

## 2021-08-30 ENCOUNTER — Encounter (HOSPITAL_COMMUNITY)
Admission: RE | Disposition: A | Discharge status: Home / Self Care | Payer: Self-pay | Source: Home / Self Care | Attending: Surgery

## 2021-08-30 SURGERY — ABDOMINAL AORTOGRAM W/LOWER EXTREMITY
Anesthesia: LOCAL

## 2021-08-30 MED ORDER — SODIUM CHLORIDE 0.9 % IV SOLN: INTRAVENOUS | Status: DC

## 2021-08-30 NOTE — Progress Notes (Signed)
Subjective:71 year old male presents the office today for concerns of significant discomfort, pain present to the lateral aspect of right foot.  He states that the calluses become very thick again causing discomfort.  He still has had the surgery performed.  He did follow-up with vascular surgery and he was awaiting an angiogram.  No other concerns today. ? ?Objective: ?AAO x3, NAD ?DP/PT pulses palpable bilaterally, CRT less than 3 seconds ?Sensation decreased. ?Significantly thick hyperkeratotic lesion present right foot fifth metatarsal base.  There is no open lesions.  Tenderness palpation directly along the skin lesion.  There is no fluctuation or crepitation.  Malodor. ?No pain with calf compression, swelling, warmth, erythema ? ?Assessment: ?Significant callus right foot ? ?Plan: ?-All treatment options discussed with the patient including all alternatives, risks, complications.  ?-Sharp debridement hyperkeratotic lesion without any complications or bleeding after all the area to have status with topical lidocaine cream.  I discussed surgical excision and grafting of the wound but he is to follow-up with vascular surgery to have angio performed prior to intervention.  I have given the contact information for vein and vascular to get scheduled ? ?[name] [name] DPM ?

## 2021-08-30 NOTE — Interval H&P Note (Signed)
History and Physical Interval Note: ? ?08/30/2021 ?11:13 AM ? 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  The various methods of treatment have been discussed with the patient and family. After consideration of risks, benefits and other options for treatment, the patient has consented to  Procedure(s): ?ABDOMINAL AORTOGRAM W/LOWER EXTREMITY (N/A) as a surgical intervention.  The patient's history has been reviewed, patient examined, no change in status, stable for surgery.  I have reviewed the patient's chart and labs.  Questions were answered to the patient's satisfaction.   ? ? ?[name] [name] ? ? ?

## 2021-09-07 ENCOUNTER — Other Ambulatory Visit: Payer: Self-pay

## 2021-09-20 ENCOUNTER — Encounter (HOSPITAL_COMMUNITY): Admission: RE | Payer: Self-pay | Source: Home / Self Care

## 2021-09-20 ENCOUNTER — Ambulatory Visit (HOSPITAL_COMMUNITY): Admission: RE | Admit: 2021-09-20 | Payer: Medicare Other | Source: Home / Self Care | Admitting: Surgery

## 2021-09-20 SURGERY — ABDOMINAL AORTOGRAM W/LOWER EXTREMITY
Anesthesia: LOCAL

## 2021-10-11 ENCOUNTER — Ambulatory Visit (HOSPITAL_COMMUNITY)
Admission: RE | Admit: 2021-10-11 | Discharge: 2021-10-11 | Disposition: A | Discharge status: Home / Self Care | Payer: Medicare Other | Attending: Surgery | Admitting: Surgery

## 2021-10-11 ENCOUNTER — Other Ambulatory Visit: Payer: Self-pay

## 2021-10-11 ENCOUNTER — Ambulatory Visit (HOSPITAL_COMMUNITY)
Admission: RE | Disposition: A | Discharge status: Home / Self Care | Payer: Self-pay | Source: Home / Self Care | Attending: Surgery

## 2021-10-11 DIAGNOSIS — N189 Chronic kidney disease, unspecified: Secondary | ICD-10-CM | POA: Diagnosis not present

## 2021-10-11 DIAGNOSIS — I70234 Atherosclerosis of native arteries of right leg with ulceration of heel and midfoot: Secondary | ICD-10-CM | POA: Diagnosis not present

## 2021-10-11 DIAGNOSIS — L97419 Non-pressure chronic ulcer of right heel and midfoot with unspecified severity: Secondary | ICD-10-CM | POA: Diagnosis not present

## 2021-10-11 DIAGNOSIS — E11621 Type 2 diabetes mellitus with foot ulcer: Secondary | ICD-10-CM | POA: Insufficient documentation

## 2021-10-11 DIAGNOSIS — E7800 Pure hypercholesterolemia, unspecified: Secondary | ICD-10-CM | POA: Insufficient documentation

## 2021-10-11 DIAGNOSIS — I5032 Chronic diastolic (congestive) heart failure: Secondary | ICD-10-CM | POA: Diagnosis not present

## 2021-10-11 DIAGNOSIS — I130 Hypertensive heart and chronic kidney disease with heart failure and stage 1 through stage 4 chronic kidney disease, or unspecified chronic kidney disease: Secondary | ICD-10-CM | POA: Insufficient documentation

## 2021-10-11 DIAGNOSIS — Z7984 Long term (current) use of oral hypoglycemic drugs: Secondary | ICD-10-CM | POA: Insufficient documentation

## 2021-10-11 DIAGNOSIS — E1122 Type 2 diabetes mellitus with diabetic chronic kidney disease: Secondary | ICD-10-CM | POA: Insufficient documentation

## 2021-10-11 DIAGNOSIS — E1151 Type 2 diabetes mellitus with diabetic peripheral angiopathy without gangrene: Secondary | ICD-10-CM | POA: Insufficient documentation

## 2021-10-11 DIAGNOSIS — Z87891 Personal history of nicotine dependence: Secondary | ICD-10-CM | POA: Insufficient documentation

## 2021-10-11 HISTORY — PX: ABDOMINAL AORTOGRAM W/LOWER EXTREMITY: CATH118223

## 2021-10-11 LAB — POCT I-STAT, CHEM 8
BUN: 10 mg/dL (ref 8–23)
Calcium, Ion: 1.19 mmol/L (ref 1.15–1.40)
Chloride: 102 mmol/L (ref 98–111)
Creatinine, Ser: 1.3 mg/dL — ABNORMAL HIGH (ref 0.61–1.24)
Glucose, Bld: 122 mg/dL — ABNORMAL HIGH (ref 70–99)
HCT: 42 % (ref 39.0–52.0)
Hemoglobin: 14.3 g/dL (ref 13.0–17.0)
Potassium: 4.2 mmol/L (ref 3.5–5.1)
Sodium: 142 mmol/L (ref 135–145)
TCO2: 29 mmol/L (ref 22–32)

## 2021-10-11 LAB — GLUCOSE, CAPILLARY: Glucose-Capillary: 124 mg/dL — ABNORMAL HIGH (ref 70–99)

## 2021-10-11 SURGERY — ABDOMINAL AORTOGRAM W/LOWER EXTREMITY
Anesthesia: LOCAL

## 2021-10-11 MED ORDER — SODIUM CHLORIDE 0.9 % IV SOLN: INTRAVENOUS | Status: DC

## 2021-10-11 MED ORDER — MIDAZOLAM HCL 2 MG/2ML IJ SOLN
INTRAMUSCULAR | Status: DC | PRN
Start: 2021-10-11 — End: 2021-10-11
  Administered 2021-10-11: 2 mg via INTRAVENOUS
  Administered 2021-10-11 (×2): 1 mg via INTRAVENOUS

## 2021-10-11 MED ORDER — MORPHINE SULFATE (PF) 2 MG/ML IV SOLN: 2.0000 mg | INTRAVENOUS | Status: DC | PRN

## 2021-10-11 MED ORDER — HYDRALAZINE HCL 20 MG/ML IJ SOLN: 5.0000 mg | INTRAMUSCULAR | Status: DC | PRN

## 2021-10-11 MED ORDER — SODIUM CHLORIDE 0.9 % IV SOLN: 250.0000 mL | INTRAVENOUS | Status: DC | PRN

## 2021-10-11 MED ORDER — HEPARIN (PORCINE) IN NACL 1000-0.9 UT/500ML-% IV SOLN
INTRAVENOUS | Status: AC
Start: 2021-10-11 — End: ?
  Filled 2021-10-11: qty 1000

## 2021-10-11 MED ORDER — MIDAZOLAM HCL 2 MG/2ML IJ SOLN
INTRAMUSCULAR | Status: AC
  Filled 2021-10-11: qty 2

## 2021-10-11 MED ORDER — OXYCODONE HCL 5 MG PO TABS: 5.0000 mg | ORAL_TABLET | ORAL | Status: DC | PRN

## 2021-10-11 MED ORDER — FENTANYL CITRATE (PF) 100 MCG/2ML IJ SOLN
INTRAMUSCULAR | Status: DC | PRN
  Administered 2021-10-11 (×3): 50 ug via INTRAVENOUS

## 2021-10-11 MED ORDER — FENTANYL CITRATE (PF) 100 MCG/2ML IJ SOLN
INTRAMUSCULAR | Status: AC
  Filled 2021-10-11: qty 2

## 2021-10-11 MED ORDER — LIDOCAINE HCL (PF) 1 % IJ SOLN
INTRAMUSCULAR | Status: DC | PRN
  Administered 2021-10-11: 12 mL

## 2021-10-11 MED ORDER — SODIUM CHLORIDE 0.9% FLUSH: 3.0000 mL | Freq: Two times a day (BID) | INTRAVENOUS | Status: DC

## 2021-10-11 MED ORDER — SODIUM CHLORIDE 0.9 % WEIGHT BASED INFUSION: 1.0000 mL/kg/h | INTRAVENOUS | Status: DC

## 2021-10-11 MED ORDER — LABETALOL HCL 5 MG/ML IV SOLN: 10.0000 mg | INTRAVENOUS | Status: DC | PRN

## 2021-10-11 MED ORDER — SODIUM CHLORIDE 0.9% FLUSH: 3.0000 mL | INTRAVENOUS | Status: DC | PRN

## 2021-10-11 MED ORDER — ONDANSETRON HCL 4 MG/2ML IJ SOLN: 4.0000 mg | Freq: Four times a day (QID) | INTRAMUSCULAR | Status: DC | PRN

## 2021-10-11 MED ORDER — LIDOCAINE HCL (PF) 1 % IJ SOLN
INTRAMUSCULAR | Status: AC
  Filled 2021-10-11: qty 30

## 2021-10-11 SURGICAL SUPPLY — 12 items
CATH OMNI FLUSH 5F 65CM (CATHETERS) ×1 IMPLANT
DEVICE TORQUE H2O (MISCELLANEOUS) ×1 IMPLANT
DEVICE VASC CLSR CELT ART 5 (Vascular Products) ×1 IMPLANT
GUIDEWIRE ANGLED .035X150CM (WIRE) ×1 IMPLANT
KIT MICROPUNCTURE NIT STIFF (SHEATH) ×1 IMPLANT
KIT PV (KITS) ×2 IMPLANT
SHEATH PINNACLE 5F 10CM (SHEATH) ×1 IMPLANT
SHEATH PROBE COVER 6X72 (BAG) ×1 IMPLANT
SYR MEDRAD MARK 7 150ML (SYRINGE) ×2 IMPLANT
TRANSDUCER W/STOPCOCK (MISCELLANEOUS) ×2 IMPLANT
TRAY PV CATH (CUSTOM PROCEDURE TRAY) ×2 IMPLANT
WIRE BENTSON .035X145CM (WIRE) ×1 IMPLANT

## 2021-10-11 NOTE — H&P (Signed)
?  ?[facility] [place] ?  ?Patient name: [name] [name]  MRN: [id number]        DOB: Dec 12, 1950          Sex: male ?  ?  ?REQUESTING PROVIDER:  ?  ? Dr. Jacqualyn Posey ?  ?  ?REASON FOR CONSULT:  ?  ?ulcer ?  ?HISTORY OF PRESENT ILLNESS:  ?  ?[name] [name] is a 71 y.o. male, who is referred for evaluation of lower extremity atherosclerotic disease.  Patient has been having significant pain in his right foot for many years.  He also has a history of a wound in that area with callus formation, requiring frequent debridement.  He is being considered for surgical intervention.  Arterial evaluation has been requested prior to procedure. ? ?The patient has a history of poorly controlled diabetes.  History of chronic renal sufficiency.  Left grade 1 diastolic congestive heart failure he takes a statin hypercholesterolemia.  He is on ARB for hypertension.  History of smoking. ?  ?PAST MEDICAL HISTORY  ?  ?  ?    ?Past Medical History:  ?Diagnosis Date  ? Anemia    ?  hx of blood transfusion  ? Arthritis    ?  GOUT  ? CHF (congestive heart failure) ([place])    ?  Grade 1 diastolic function per PCP LOVN  ? Chronic kidney disease    ?  acute renal failure in February admission  ? Chronic pain    ? Complication of anesthesia    ?  difficulty waking up  ? Depression    ? Diabetes mellitus    ?  not consistent on taking medications  ? Gout    ? History of blood clots 1986 or 87  ?  blood clot in groin?  ? History of headache    ? History of hepatitis C    ?  competed treatment  ? Hyperlipidemia    ? Hypertension    ?  Takes prinzide daily  ? Neuromuscular disorder ([place])    ?  gout and neuropathy  ? Peripheral vascular disease ([place])    ? Varicose veins    ? Vein, varicose    ? Wound check, dressing change    ?  dressing changes 3x/week; open wound  ?  ?  ?  ?FAMILY HISTORY  ?  ?     ?Family History  ?Problem Relation Age of Onset  ? Cancer Mother    ?      liver?  ? Hyperlipidemia Father    ?  Hypertension Father    ? Diabetes Father    ? Alcohol abuse Father    ? Arthritis Father    ? Kidney disease Father    ? Diabetes Son    ? Heart attack Neg Hx    ?  ?  ?SOCIAL HISTORY:  ?  ?Social History  ?  ?     ?Socioeconomic History  ? Marital status: Legally Separated  ?    Spouse name: Not on file  ? Number of children: 5  ? Years of education: Not on file  ? Highest education level: Not on file  ?Occupational History  ? Not on file  ?Tobacco Use  ? Smoking status: Former  ?    Packs/day: 0.25  ?    Years: 35.00  ?    Pack years: 8.75  ?    Types: Cigars, Cigarettes  ?  Quit date: 12/07/2014  ?    Years since quitting: 6.6  ? Smokeless tobacco: Never  ? Tobacco comments:  ?    3 cigars a day  ?Vaping Use  ? Vaping Use: Never used  ?Substance and Sexual Activity  ? Alcohol use: No  ?    Comment: none in 20 years  ? Drug use: No  ? Sexual activity: Not Currently  ?Other Topics Concern  ? Not on file  ?Social History Narrative  ?  Regular exercise:  Walks a little every day.  ?  Lives with 72 yr old son.  ?  Associated degree social services  ?  Working on Building services engineer.  ?  Quit smoking few months ago.  ?  Goes to [facility]- drug free since 1994.  ?  Former Etoh abuse.  ?  ?Social Determinants of Health  ?  ?Financial Resource Strain: Not on file  ?Food Insecurity: Not on file  ?Transportation Needs: Not on file  ?Physical Activity: Not on file  ?Stress: Not on file  ?Social Connections: Not on file  ?Intimate Partner Violence: Not on file  ?  ?  ?ALLERGIES:  ?  ?  ?     ?Allergies  ?Allergen Reactions  ? Poractant Alfa Anaphylaxis  ? Pork-Derived Products Nausea And Vomiting  ?    Extremely sick  ? Bactrim [Sulfamethoxazole-Trimethoprim] Other (See Comments)  ?    Blisters  ? Lisinopril    ?    Hyperkalemia ?   ? Acetaminophen Nausea And Vomiting  ?  ?  ?CURRENT MEDICATIONS:  ?  ?  ?      ?Current Outpatient Medications  ?Medication Sig Dispense Refill  ? albuterol (VENTOLIN HFA) 108 (90 Base)  MCG/ACT inhaler Inhale 2 puffs into the lungs every 4 (four) hours as needed for wheezing or shortness of breath.      ? Alcohol Swabs (B-D SINGLE USE SWABS REGULAR) PADS Use to check blood sugar once a day. Dx E11.91 100 each 1  ? allopurinol (ZYLOPRIM) 300 MG tablet Take 300 mg by mouth daily.   6  ? amitriptyline (ELAVIL) 25 MG tablet TAKE 2-3 TABLETS BY MOUTH DAILY AT BEDTIME AS DIRECTED. 90 tablet 5  ? aspirin 81 MG EC tablet Take 1 tablet by mouth daily.      ? atorvastatin (LIPITOR) 40 MG tablet Take 1 tablet (40 mg total) by mouth daily. 90 tablet 1  ? Blood Glucose Calibration (OT ULTRA/FASTTK CNTRL SOLN) SOLN See admin instructions.      ? Blood Glucose Monitoring Suppl (ONE TOUCH ULTRA 2) w/Device KIT USE TO CHECK BLOOD SUGAR ONCE A DAY. DX E11.9 1 each 0  ? colchicine 0.6 MG tablet IF GOUT FLARE TAKE 2 TABLETS FOLLOWED BY 1 TABLET 1 HOUR LATER. THEN RESUME ONE TABLET DAILY. 120 tablet 1  ? Continuous Blood Gluc Receiver (FREESTYLE LIBRE 14 DAY READER) DEVI Use to check BS twice a day/ E11.42      ? Ferrous Sulfate (IRON) 325 (65 FE) MG TABS Take 1 tablet by mouth daily. 30 each 2  ? furosemide (LASIX) 20 MG tablet Take 1 tablet (20 mg total) by mouth daily. 90 tablet 1  ? gabapentin (NEURONTIN) 800 MG tablet TAKE ONE TABLET BY MOUTH 3 TIMES DAILY(AM,NOON,EVE) 270 tablet 1  ? glucose blood test strip ONE TOUCH ULTRA. Use as instructed to check blood sugar once a day.  DX.. E11.9 100 each 1  ? hydrochlorothiazide (HYDRODIURIL) 12.5 MG tablet  Take 12.5 mg by mouth daily.      ? Lancet Devices (EASY MINI EJECT LANCING DEVICE) MISC 2 (two) times daily.      ? lidocaine (XYLOCAINE) 5 % ointment APPLY 1 GRAM TOPICALLY TO THE AFFECTED AREA FOUR TIMES DAILY 35.44 g 1  ? losartan (COZAAR) 25 MG tablet TAKE ONE TABLET BY MOUTH ONCE DAILY. 90 tablet 1  ? metFORMIN (GLUCOPHAGE-XR) 500 MG 24 hr tablet Take 2 tablets (1,000 mg total) by mouth daily with breakfast. (Patient taking differently: Take 500 mg by mouth 2  (two) times daily.) 180 tablet 1  ? metoprolol succinate (TOPROL-XL) 25 MG 24 hr tablet Take 1 tablet (25 mg total) by mouth daily. 90 tablet 3  ? NARCAN 4 MG/0.1ML LIQD nasal spray kit 1 - 2 PUFFS AS DIRECTED IN CASE OF ACCIDENTAL OVER DOSE OF PAIN MEDICATIONS   0  ? ONETOUCH DELICA LANCETS 25O MISC Use to check blood sugar once a day.  Dx  E11.9 100 each 1  ? predniSONE (DELTASONE) 10 MG tablet Take 10 mg by mouth daily as needed (Gout).      ? pregabalin (LYRICA) 300 MG capsule Take 1 capsule (300 mg total) by mouth 2 (two) times daily. 60 capsule 0  ? QUEtiapine (SEROQUEL) 100 MG tablet Take 100 mg by mouth at bedtime.      ? sitaGLIPtin (JANUVIA) 100 MG tablet Take 1 tablet (100 mg total) by mouth daily. (Patient taking differently: Take 50 mg by mouth daily.) 90 tablet 1  ? vitamin B-12 (CYANOCOBALAMIN) 1000 MCG tablet Take 1,000 mcg by mouth daily.      ?  ?No current facility-administered medications for this visit.  ?  ?  ?REVIEW OF SYSTEMS:  ?  ?_0  denotes positive finding, _1  denotes negative finding ?Cardiac   Comments:  ?Chest pain or chest pressure:      ?Shortness of breath upon exertion:      ?Short of breath when lying flat:      ?Irregular heart rhythm:      ?       ?Vascular      ?Pain in calf, thigh, or hip brought on by ambulation:      ?Pain in feet at night that wakes you up from your sleep:       ?Blood clot in your veins:      ?Leg swelling:       ?       ?Pulmonary      ?Oxygen at home:      ?Productive cough:       ?Wheezing:       ?       ?Neurologic      ?Sudden weakness in arms or legs:       ?Sudden numbness in arms or legs:       ?Sudden onset of difficulty speaking or slurred speech:      ?Temporary loss of vision in one eye:       ?Problems with dizziness:       ?       ?Gastrointestinal      ?Blood in stool:       ?  ?Vomited blood:       ?       ?Genitourinary      ?Burning when urinating:       ?Blood in urine:      ?       ?Psychiatric      ?  Major depression:       ?        ?Hematologic      ?Bleeding problems:      ?Problems with blood clotting too easily:      ?       ?Skin      ?Rashes or ulcers:      ?       ?Constitutional      ?Fever or chills:      ?  ?PHYSICAL EXAM:  ?  ?   ?Vit

## 2021-10-11 NOTE — Op Note (Signed)
? ? ?  Patient name: [name] [name] MRN: [id number] DOB: April 17, 1951 Sex: male ? ?10/11/2021 ?Pre-operative Diagnosis: Right foot ulcer ?Post-operative diagnosis:  Same ?Surgeon:  Durene Cal ?Procedure Performed: ? 1.  Ultrasound-guided access, left femoral artery ? 2.  Abdominal aortogram ? 3.  Bilateral lower extremity runoff ? 4.  Conscious sedation, 23 minutes ? 5.  Closure device, Celt ? ?W1, I0, F0 ?Indications: This is a 71 year old gentleman with a right heel wound who comes in today for angiography ? ?Procedure:  The patient was identified in the holding area and taken to room 8.  The patient was then placed supine on the table and prepped and draped in the usual sterile fashion.  A time out was called.  Conscious sedation was administered with the use of IV fentanyl and Versed under continuous physician and nurse monitoring.  Heart rate, blood pressure, and oxygen saturation were continuously monitored.  Total sedation time was 23 minutes.  Ultrasound was used to evaluate the left common femoral artery.  It was patent .  A digital ultrasound image was acquired.  A micropuncture needle was used to access the left common femoral artery under ultrasound guidance.  An 018 wire was advanced without resistance and a micropuncture sheath was placed.  The 018 wire was removed and a benson wire was placed.  The micropuncture sheath was exchanged for a 5 french sheath.  An omniflush catheter was advanced over the wire to the level of L-1.  An abdominal angiogram was obtained.  Next, using the omniflush catheter was pulled out of the aortic bifurcation and bilateral runoff was obtained. ? ?Findings:  ? Aortogram: No significant renal artery stenosis was identified.  The infrarenal abdominal was widely patent.  Bilateral common and external iliac arteries have mild disease with no hemodynamically significant stenosis. ? Right Lower Extremity: Right common femoral and profundofemoral artery are widely patent.  The right  superficial femoral artery has mild disease without hemodynamically significant stenosis.  The popliteal artery widely patent.  There is two-vessel runoff via the posterior tibial and peroneal.  The peroneal does reconstitute the dorsalis pedis.  Diffuse disease out onto the foot ? Left Lower Extremity: The left common femoral and profunda femoral artery are widely patent.  The left superficial femoral artery has mild disease without hemodynamically significant stenosis.  The left popliteal artery widely patent.  There is two-vessel runoff via the posterior tibial and peroneal.  The peroneal does reconstitute the dorsalis pedis.  Diffuse disease out onto the foot ? ? ?Intervention: Celt was used for closure ? ?Impression: ? #1 inflow and outflow vessels are patent without hemodynamically significant stenosis ? #2 two-vessel runoff bilaterally via the peroneal and posterior tibial arteries with reconstitution of the dorsalis pedis from peroneal collaterals. ? #3 diffuse foot disease bilaterally ? ? ?V. Durene Cal, M.D., FACS ?[facility] [place] ?Office: [phone] ?Pager:  [phone]  ?

## 2021-10-12 ENCOUNTER — Encounter (HOSPITAL_COMMUNITY): Payer: Self-pay | Admitting: Surgery

## 2021-10-12 MED FILL — Heparin Sod (Porcine)-NaCl IV Soln 1000 Unit/500ML-0.9%: INTRAVENOUS | Qty: 1000 | Status: AC

## 2021-10-17 ENCOUNTER — Encounter (HOSPITAL_COMMUNITY): Payer: Self-pay | Admitting: Surgery

## 2021-11-07 ENCOUNTER — Ambulatory Visit (INDEPENDENT_AMBULATORY_CARE_PROVIDER_SITE_OTHER): Payer: Medicare Other | Admitting: Podiatry

## 2021-11-07 DIAGNOSIS — L905 Scar conditions and fibrosis of skin: Secondary | ICD-10-CM | POA: Diagnosis not present

## 2021-11-07 DIAGNOSIS — E1142 Type 2 diabetes mellitus with diabetic polyneuropathy: Secondary | ICD-10-CM

## 2021-11-07 DIAGNOSIS — M79674 Pain in right toe(s): Secondary | ICD-10-CM | POA: Diagnosis not present

## 2021-11-07 DIAGNOSIS — B351 Tinea unguium: Secondary | ICD-10-CM | POA: Diagnosis not present

## 2021-11-07 DIAGNOSIS — L84 Corns and callosities: Secondary | ICD-10-CM | POA: Diagnosis not present

## 2021-11-07 DIAGNOSIS — M79675 Pain in left toe(s): Secondary | ICD-10-CM | POA: Diagnosis not present

## 2021-11-14 NOTE — Progress Notes (Signed)
Subjective:71 year old male presents the office today for concerns of significant discomfort, pain present to the lateral aspect of right foot.  Also has thick, elongated toes that he is not able to trim himself causing discomfort.  He states that the calluses become very thick again causing discomfort.  He is still interested in having surgery to excise the lesion.  He has follow-up with vascular surgery.  No new ulcerations.  No new concerns.   Objective: AAO x3, NAD DP/PT pulses palpable bilaterally, CRT less than 3 seconds Sensation decreased. Nails are hypertrophic, dystrophic, brittle, discolored, elongated 10. No surrounding redness or drainage. Tenderness nails 1-5 bilaterally. Significantly thick hyperkeratotic lesion present right foot fifth metatarsal base.  There is no open lesions.  Tenderness palpation directly along the skin lesion.  There is no fluctuation or crepitation.  Malodor. No pain with calf compression, swelling, warmth, erythema  Assessment: Significant callus right foot  Plan: -All treatment options discussed with the patient including all alternatives, risks, complications.  -Nails sharply debrided x10 without any complications or bleeding. -Sharp debridement hyperkeratotic lesion without any complications or bleeding after all the area to have status with topical lidocaine cream.  I discussed surgical excision and grafting of the wound.  He did follow-up with vascular surgery.  On the right foot there is two-vessel runoff via the posterior tibial and peroneal arteries.  The peroneal does reconstitute the dorsalis pedis.  Diffuse disease however found to the foot.  Discussed with her risks of excising and grafting this wound as it could not heal, delayed healing or even up in the same situation with a thick scar.  Vivi Barrack DPM

## 2022-01-03 ENCOUNTER — Ambulatory Visit (INDEPENDENT_AMBULATORY_CARE_PROVIDER_SITE_OTHER): Payer: Medicare Other | Admitting: Podiatry

## 2022-01-03 DIAGNOSIS — E1142 Type 2 diabetes mellitus with diabetic polyneuropathy: Secondary | ICD-10-CM | POA: Diagnosis not present

## 2022-01-03 DIAGNOSIS — L989 Disorder of the skin and subcutaneous tissue, unspecified: Secondary | ICD-10-CM

## 2022-01-03 DIAGNOSIS — I739 Peripheral vascular disease, unspecified: Secondary | ICD-10-CM

## 2022-01-03 NOTE — Progress Notes (Signed)
Subjective: 71 year old male presents the office today for follow-up evaluation of painful skin lesion on the lateral aspect of his right foot.  Again asking if the prep was adequate to have the procedure performed to excise the lesion, grafting.  He states has not been as bad recently has been keeping it trimmed more frequently.  No drainage or pus.  No open lesions that he reports.  No other concerns.  No fevers or chills.  Objective: AAO x3, NAD DP/PT pulses palpable bilaterally, CRT less than 3 seconds Sensation decreased. Thick hyperkeratotic lesion present right foot fifth metatarsal base.  There is no open lesions.  Tenderness palpation directly along the skin lesion.  There is no fluctuation or crepitation.  Malodor. No pain with calf compression, swelling, warmth, erythema  Assessment: Painful hyperkeratotic tissue right foot   Plan: -All treatment options discussed with the patient including all alternatives, risks, complications.  -Sharp debridement hyperkeratotic lesion without any complications or bleeding after all the area to have status with topical lidocaine cream.  I discussed surgical excision and grafting of the wound.  He did follow-up with vascular surgery.  On the right foot there is two-vessel runoff via the posterior tibial and peroneal arteries.  The peroneal does reconstitute the dorsalis pedis.  Diffuse disease however found to the foot.  Discussed with her risks of excising and grafting this wound as it could not heal, delayed healing or even up in the same situation with a thick scar.  I discussed the risks of this again today.  I will send my note today to Dr. Myra Gianotti to see if there is adequate circulation to heal an ulcer on the right foot as this is what would be created to excise the lesion and grafting.  Patient is aware that if we did do this is a chance that the lesion could still come back and thicker even more so than not heal.   [name] [name]  DPM   [name] [name] DPM

## 2022-02-07 ENCOUNTER — Ambulatory Visit (INDEPENDENT_AMBULATORY_CARE_PROVIDER_SITE_OTHER): Payer: Medicare Other | Admitting: Podiatry

## 2022-02-07 DIAGNOSIS — I739 Peripheral vascular disease, unspecified: Secondary | ICD-10-CM

## 2022-02-07 DIAGNOSIS — E1142 Type 2 diabetes mellitus with diabetic polyneuropathy: Secondary | ICD-10-CM

## 2022-02-07 DIAGNOSIS — M79675 Pain in left toe(s): Secondary | ICD-10-CM | POA: Diagnosis not present

## 2022-02-07 DIAGNOSIS — L84 Corns and callosities: Secondary | ICD-10-CM

## 2022-02-07 DIAGNOSIS — B351 Tinea unguium: Secondary | ICD-10-CM

## 2022-02-07 DIAGNOSIS — M79674 Pain in right toe(s): Secondary | ICD-10-CM

## 2022-02-07 NOTE — Progress Notes (Signed)
Subjective: 71 year old male presents the office today for follow-up evaluation of painful skin lesion on the lateral aspect of his right foot.  All this of the nails are thickened discolored causing discomfort.  No swelling redness or drainage to the toenails.  Objective: AAO x3, NAD DP/PT pulses palpable 1/4 bilaterally, CRT less than 3 seconds Sensation decreased. Thick hyperkeratotic lesion present right foot fifth metatarsal base.  There is no open lesions.  Tenderness palpation directly along the skin lesion.  There is no fluctuation or crepitation.  Malodor. Nails are hypertrophic, dystrophic, brittle, discolored, elongated 10. No surrounding redness or drainage. Tenderness nails 1-5 bilaterally.  No pain with calf compression, swelling, warmth, erythema  Assessment: Painful hyperkeratotic tissue right foot ; symptomatic onychomycosis  Plan: -All treatment options discussed with the patient including all alternatives, risks, complications.  -Sharp debrided nails x10 without any complications or bleeding. -Sharp debridement hyperkeratotic lesion without any complications or bleeding after all the area to have status with topical lidocaine cream.  I discussed surgical excision and grafting of the wound.  He did follow-up with vascular surgery.  On the right foot there is two-vessel runoff via the posterior tibial and peroneal arteries.  The peroneal does reconstitute the dorsalis pedis.  Diffuse disease however found to the foot.  Discussed with her risks of excising and grafting this wound as it could not heal, delayed healing or even up in the same situation with a thick scar.  I discussed the risks of this again today.     Vivi Barrack DPM

## 2022-04-07 ENCOUNTER — Ambulatory Visit (INDEPENDENT_AMBULATORY_CARE_PROVIDER_SITE_OTHER): Payer: Medicare Other | Admitting: Podiatry

## 2022-04-07 DIAGNOSIS — M79675 Pain in left toe(s): Secondary | ICD-10-CM | POA: Diagnosis not present

## 2022-04-07 DIAGNOSIS — I739 Peripheral vascular disease, unspecified: Secondary | ICD-10-CM | POA: Diagnosis not present

## 2022-04-07 DIAGNOSIS — L84 Corns and callosities: Secondary | ICD-10-CM

## 2022-04-07 DIAGNOSIS — E1142 Type 2 diabetes mellitus with diabetic polyneuropathy: Secondary | ICD-10-CM

## 2022-04-07 DIAGNOSIS — B351 Tinea unguium: Secondary | ICD-10-CM | POA: Diagnosis not present

## 2022-04-07 DIAGNOSIS — M79674 Pain in right toe(s): Secondary | ICD-10-CM

## 2022-04-09 NOTE — Progress Notes (Signed)
Subjective: 71 year old male presents the office today for follow-up evaluation of painful skin lesion on the lateral aspect of his right foot.  All this of the nails are thickened discolored causing discomfort.  No swelling redness or drainage to the toenails.  Muscle-surgery but he was asked about can order him donut pads to help offload the skin lesion on the right foot.  Objective: AAO x3, NAD DP/PT pulses palpable 1/4 bilaterally, CRT less than 3 seconds Sensation decreased. Thick hyperkeratotic lesion present right foot fifth metatarsal base.  There is no open lesions.  Tenderness palpation directly along the skin lesion.  There is no fluctuation or crepitation.  Malodor. Nails are hypertrophic, dystrophic, brittle, discolored, elongated 10. No surrounding redness or drainage. Tenderness nails 1-5 bilaterally.  No pain with calf compression, swelling, warmth, erythema  Assessment: Painful hyperkeratotic tissue right foot ; symptomatic onychomycosis  Plan: -All treatment options discussed with the patient including all alternatives, risks, complications.  -Sharp debrided nails x10 without any complications or bleeding. -She will continue the skin lesion x1 without any complications or bleeding. -I dispensed donut pads.  I will try to order this for him as well.  Again discussed surgical intervention but decided to hold off on this. -Foot inspection.  Trula Slade DPM

## 2022-07-10 ENCOUNTER — Ambulatory Visit (INDEPENDENT_AMBULATORY_CARE_PROVIDER_SITE_OTHER): Payer: 59 | Admitting: Podiatry

## 2022-07-10 ENCOUNTER — Encounter: Payer: Self-pay | Admitting: Podiatry

## 2022-07-10 VITALS — BP 136/60 | HR 70

## 2022-07-10 DIAGNOSIS — M79675 Pain in left toe(s): Secondary | ICD-10-CM | POA: Diagnosis not present

## 2022-07-10 DIAGNOSIS — L84 Corns and callosities: Secondary | ICD-10-CM

## 2022-07-10 DIAGNOSIS — M79674 Pain in right toe(s): Secondary | ICD-10-CM

## 2022-07-10 DIAGNOSIS — E1142 Type 2 diabetes mellitus with diabetic polyneuropathy: Secondary | ICD-10-CM | POA: Diagnosis not present

## 2022-07-10 DIAGNOSIS — B351 Tinea unguium: Secondary | ICD-10-CM | POA: Diagnosis not present

## 2022-07-10 NOTE — Progress Notes (Unsigned)
Subjective: Chief Complaint  Patient presents with   Nail Problem    Patient reports toenails long, thick and difficult to trim.    Callouses    Pre-ulcerative callus needing trim    72 year old male presents the office today for follow-up evaluation of painful skin lesion on the lateral aspect of his right foot.  All this of the nails are thickened discolored causing discomfort.  No swelling redness or drainage to the toenails.  Muscle-surgery but he was asked about can order him donut pads to help offload the skin lesion on the right foot.  Objective: AAO x3, NAD DP/PT pulses palpable 1/4 bilaterally, CRT less than 3 seconds Sensation decreased. Thick hyperkeratotic lesion present right foot fifth metatarsal base.  There is no open lesions.  Tenderness palpation directly along the skin lesion.  There is no fluctuation or crepitation.  Malodor. Nails are hypertrophic, dystrophic, brittle, discolored, elongated 10. No surrounding redness or drainage. Tenderness nails 1-5 bilaterally.  No pain with calf compression, swelling, warmth, erythema  Assessment: Painful hyperkeratotic tissue right foot ; symptomatic onychomycosis  Plan: -All treatment options discussed with the patient including all alternatives, risks, complications.  -Sharp debrided nails x10 without any complications or bleeding. -She will continue the skin lesion x1 without any complications or bleeding. -I dispensed donut pads.  I will try to order this for him as well.  Again discussed surgical intervention but decided to hold off on this. -Foot inspection.  Trula Slade DPM

## 2022-10-09 ENCOUNTER — Ambulatory Visit (INDEPENDENT_AMBULATORY_CARE_PROVIDER_SITE_OTHER): Payer: 59 | Admitting: Podiatry

## 2022-10-09 DIAGNOSIS — M79675 Pain in left toe(s): Secondary | ICD-10-CM

## 2022-10-09 DIAGNOSIS — B351 Tinea unguium: Secondary | ICD-10-CM

## 2022-10-09 DIAGNOSIS — M79674 Pain in right toe(s): Secondary | ICD-10-CM

## 2022-10-09 DIAGNOSIS — E1142 Type 2 diabetes mellitus with diabetic polyneuropathy: Secondary | ICD-10-CM

## 2022-10-09 DIAGNOSIS — L84 Corns and callosities: Secondary | ICD-10-CM | POA: Diagnosis not present

## 2022-10-11 NOTE — Progress Notes (Signed)
Subjective: Chief Complaint  Patient presents with   Nail Problem    Thick painful toenails, 3 month follow up    72 year old male presents the office today for follow-up evaluation of painful skin lesion on the lateral aspect of his right foot.  All this of the nails are thickened discolored causing discomfort.  He states that the callus is not as thick as what it has been previously but it is still causing pain. No opening, drainage or signs of infection he reports.     Objective: AAO x3, NAD DP/PT pulses palpable 1/4 bilaterally, CRT less than 3 seconds Sensation decreased. Thick hyperkeratotic lesion present right foot fifth metatarsal base.  There is no open lesions.  Tenderness palpation directly along the skin lesion.  There is no fluctuation or crepitation.  Malodor. Nails are hypertrophic, dystrophic, brittle, discolored, elongated 10. No surrounding redness or drainage. Tenderness nails 1-5 bilaterally.  No pain with calf compression, swelling, warmth, erythema  Assessment: Painful hyperkeratotic tissue right foot ; symptomatic onychomycosis  Plan: -All treatment options discussed with the patient including all alternatives, risks, complications.  -Sharp debrided nails x10 without any complications or bleeding. -She will continue the skin lesion x1 without any complications or bleeding. Continue offloading.  -Daily foot inspection.  Vivi Barrack DPM

## 2023-01-09 ENCOUNTER — Encounter: Payer: Self-pay | Admitting: Podiatry

## 2023-01-09 ENCOUNTER — Ambulatory Visit: Payer: 59 | Admitting: Podiatry

## 2023-01-09 DIAGNOSIS — L84 Corns and callosities: Secondary | ICD-10-CM | POA: Diagnosis not present

## 2023-01-09 DIAGNOSIS — M79675 Pain in left toe(s): Secondary | ICD-10-CM | POA: Diagnosis not present

## 2023-01-09 DIAGNOSIS — M79674 Pain in right toe(s): Secondary | ICD-10-CM

## 2023-01-09 DIAGNOSIS — E1142 Type 2 diabetes mellitus with diabetic polyneuropathy: Secondary | ICD-10-CM

## 2023-01-09 DIAGNOSIS — B351 Tinea unguium: Secondary | ICD-10-CM

## 2023-01-09 NOTE — Progress Notes (Signed)
Subjective: Chief Complaint  Patient presents with   Callouses    Painful lesion that responds for around one month before returning. He is supposed to get a box of pads to relieve the pressure but has not received    72 year old male presents the office today for follow-up evaluation of painful skin lesion on the lateral aspect of his right foot.  All this of the nails are thickened discolored causing discomfort in the left abdomen trimmed today.  No open lesions that he reports.  No drainage or pus. No new lower extremity concerns.    Objective: AAO x3, NAD DP/PT pulses palpable 1/4 bilaterally, CRT less than 3 seconds Sensation decreased. Thick hyperkeratotic lesion present right foot fifth metatarsal base.  There is no open lesions.  Tenderness palpation directly along the skin lesion.  There is no fluctuation or crepitation.  Malodor. Nails are hypertrophic, dystrophic, brittle, discolored, elongated 10. No surrounding redness or drainage. Tenderness nails 1-5 bilaterally.  No pain with calf compression, swelling, warmth, erythema  Assessment: Painful hyperkeratotic tissue right foot ; symptomatic onychomycosis  Plan: -All treatment options discussed with the patient including all alternatives, risks, complications.  -Sharp debrided nails x10 without any complications or bleeding. -She will continue the skin lesion x1 without any complications or bleeding. Continue offloading.  I will try to reorder offloading pads to help. -Daily foot inspection.  Vivi Barrack DPM

## 2023-04-12 ENCOUNTER — Encounter: Payer: Self-pay | Admitting: Podiatry

## 2023-04-12 ENCOUNTER — Ambulatory Visit (INDEPENDENT_AMBULATORY_CARE_PROVIDER_SITE_OTHER): Payer: 59 | Admitting: Podiatry

## 2023-04-12 DIAGNOSIS — M79674 Pain in right toe(s): Secondary | ICD-10-CM

## 2023-04-12 DIAGNOSIS — L84 Corns and callosities: Secondary | ICD-10-CM

## 2023-04-12 DIAGNOSIS — I739 Peripheral vascular disease, unspecified: Secondary | ICD-10-CM | POA: Diagnosis not present

## 2023-04-12 DIAGNOSIS — B351 Tinea unguium: Secondary | ICD-10-CM | POA: Diagnosis not present

## 2023-04-12 DIAGNOSIS — M79675 Pain in left toe(s): Secondary | ICD-10-CM | POA: Diagnosis not present

## 2023-04-12 DIAGNOSIS — E1142 Type 2 diabetes mellitus with diabetic polyneuropathy: Secondary | ICD-10-CM

## 2023-04-12 NOTE — Progress Notes (Signed)
Subjective: Chief Complaint  Patient presents with   [facility]    DFC-Nails trimmed and area on side of left foot.     72 year old male presents the office today for follow-up evaluation of painful skin lesion on the lateral aspect of his right foot.  All this of the nails are thickened discolored causing discomfort.  No new skin lesions noted and no other changes.     Objective: AAO x3, NAD DP/PT pulses palpable 1/4 bilaterally, CRT less than 3 seconds Sensation decreased. Thick hyperkeratotic lesion present right foot fifth metatarsal base.  There is no open lesions.  Tenderness palpation directly along the skin lesion.  There is no fluctuation or crepitation.  Malodor. Nails are hypertrophic, dystrophic, brittle, discolored, elongated 10. No surrounding redness or drainage. Tenderness nails 1-5 bilaterally.  No pain with calf compression, swelling, warmth, erythema  Assessment: Painful hyperkeratotic tissue right foot ; symptomatic onychomycosis  Plan: -All treatment options discussed with the patient including all alternatives, risks, complications.  -Sharp debrided nails x10 without any complications or bleeding. -She will continue the skin lesion x1 without any complications or bleeding. Continue offloading.  I will try to order the pads through [employer].  -Daily foot inspection.  Vivi Barrack DPM

## 2023-07-13 ENCOUNTER — Encounter: Payer: Self-pay | Admitting: Podiatry

## 2023-07-13 ENCOUNTER — Ambulatory Visit (INDEPENDENT_AMBULATORY_CARE_PROVIDER_SITE_OTHER): Payer: 59 | Admitting: Podiatry

## 2023-07-13 DIAGNOSIS — M79675 Pain in left toe(s): Secondary | ICD-10-CM

## 2023-07-13 DIAGNOSIS — B351 Tinea unguium: Secondary | ICD-10-CM

## 2023-07-13 DIAGNOSIS — M79674 Pain in right toe(s): Secondary | ICD-10-CM | POA: Diagnosis not present

## 2023-07-13 DIAGNOSIS — L84 Corns and callosities: Secondary | ICD-10-CM

## 2023-07-13 DIAGNOSIS — E1142 Type 2 diabetes mellitus with diabetic polyneuropathy: Secondary | ICD-10-CM | POA: Diagnosis not present

## 2023-07-13 NOTE — Progress Notes (Signed)
 Subjective: Chief Complaint  Patient presents with   [facility]    RM#[id number] DFC    73 year old male presents the office today for follow-up evaluation of painful skin lesion on the lateral aspect of his right foot.  All this of the nails are thickened discolored causing discomfort.  No new skin lesions noted and no other changes. He is asking about the past that I previously ordered.    Objective: AAO x3, NAD DP/PT pulses palpable 1/4 bilaterally, CRT less than 3 seconds Sensation decreased. Thick hyperkeratotic lesion present right foot fifth metatarsal base.  There is no open lesions.  Tenderness palpation directly along the skin lesion.  There is no fluctuation or crepitation.  Malodor. Nails are hypertrophic, dystrophic, brittle, discolored, elongated 10. No surrounding redness or drainage. Tenderness nails 1-5 bilaterally.  No pain with calf compression, swelling, warmth, erythema  Assessment: Painful hyperkeratotic tissue right foot ; symptomatic onychomycosis  Plan: -All treatment options discussed with the patient including all alternatives, risks, complications.  -Sharp debrided nails x10 without any complications or bleeding  -She will continue the skin lesion x1 without any complications or bleeding at the time. However upon leaving he had some bleeding I was informed about after. Bandage was applied. Monitor for any signs of infection. Continue offloading.  I previously ordered offloading.  I will follow-up about this. -Daily foot inspection.  *I contacted [employer]  and was told the pads ordered are not covered by insurance.   Donnice JONELLE Fees DPM

## 2023-07-18 ENCOUNTER — Telehealth: Payer: Self-pay | Admitting: Podiatry

## 2023-07-18 NOTE — Telephone Encounter (Signed)
Herbert Seta, can you please let Mr. Roddey know that the pads were not covered by insurance that I had ordered previously. I spoke with [employer] and they state:  "The offloading pads we have available on our retail site at [email or web address] are $4.35 per pack with 8 pads in a pack"  Thanks!

## 2023-10-11 ENCOUNTER — Encounter: Payer: Self-pay | Admitting: Podiatry

## 2023-10-11 ENCOUNTER — Other Ambulatory Visit: Payer: Self-pay | Admitting: Podiatry

## 2023-10-11 ENCOUNTER — Ambulatory Visit: Payer: 59 | Admitting: Podiatry

## 2023-10-11 ENCOUNTER — Telehealth: Payer: Self-pay | Admitting: Podiatry

## 2023-10-11 DIAGNOSIS — M79675 Pain in left toe(s): Secondary | ICD-10-CM

## 2023-10-11 DIAGNOSIS — M79674 Pain in right toe(s): Secondary | ICD-10-CM

## 2023-10-11 DIAGNOSIS — E1142 Type 2 diabetes mellitus with diabetic polyneuropathy: Secondary | ICD-10-CM

## 2023-10-11 DIAGNOSIS — I739 Peripheral vascular disease, unspecified: Secondary | ICD-10-CM

## 2023-10-11 DIAGNOSIS — B351 Tinea unguium: Secondary | ICD-10-CM | POA: Diagnosis not present

## 2023-10-11 DIAGNOSIS — L84 Corns and callosities: Secondary | ICD-10-CM

## 2023-10-11 DIAGNOSIS — M2041 Other hammer toe(s) (acquired), right foot: Secondary | ICD-10-CM

## 2023-10-11 NOTE — Telephone Encounter (Signed)
 Pt would like referral to [facility] for shoes

## 2023-10-11 NOTE — Progress Notes (Signed)
 Subjective: Chief Complaint  Patient presents with   RFC    RM#[id number] RFC     73 year old male presents the office today for follow-up evaluation of painful skin lesion on the lateral aspect of his right foot.  All this of the nails are thickened discolored causing discomfort.  You had the purchase offloading pads from us  today.  Is also interested in diabetic shoes.    Objective: AAO x3, NAD DP/PT pulses palpable 1/4 bilaterally, CRT less than 3 seconds Sensation decreased. Hyperkeratotic lesion present right foot fifth metatarsal base.  There is no open lesions.  Tenderness palpation directly along the skin lesion.  There is no fluctuation or crepitation.  Malodor. Nails are hypertrophic, dystrophic, brittle, discolored, elongated 10. No surrounding redness or drainage. Tenderness nails 1-5 bilaterally.  Hammertoes are present. No pain with calf compression, swelling, warmth, erythema  Assessment: Painful hyperkeratotic tissue right foot ; symptomatic onychomycosis  Plan: -All treatment options discussed with the patient including all alternatives, risks, complications.  -Sharp debrided nails x10 without any complications or bleeding  -Sharply debrided the skin lesion x1 without any complications or bleeding at the time.  Continue offloading pads.   -Order faxed for diabetic shoes -Daily foot inspection.  Return in about 3 months (around 01/11/2024) for nail/calluses.  Charity Conch DPM

## 2024-02-12 ENCOUNTER — Encounter (HOSPITAL_BASED_OUTPATIENT_CLINIC_OR_DEPARTMENT_OTHER): Payer: Self-pay | Admitting: Emergency Medicine

## 2024-02-12 ENCOUNTER — Other Ambulatory Visit: Payer: Self-pay

## 2024-02-12 ENCOUNTER — Emergency Department (HOSPITAL_BASED_OUTPATIENT_CLINIC_OR_DEPARTMENT_OTHER)
Admission: EM | Admit: 2024-02-12 | Discharge: 2024-02-12 | Disposition: A | Discharge status: Home / Self Care | Attending: Emergency Medicine | Admitting: Emergency Medicine

## 2024-02-12 ENCOUNTER — Emergency Department (HOSPITAL_BASED_OUTPATIENT_CLINIC_OR_DEPARTMENT_OTHER)

## 2024-02-12 DIAGNOSIS — H5789 Other specified disorders of eye and adnexa: Secondary | ICD-10-CM | POA: Insufficient documentation

## 2024-02-12 DIAGNOSIS — Z7982 Long term (current) use of aspirin: Secondary | ICD-10-CM | POA: Diagnosis not present

## 2024-02-12 DIAGNOSIS — M25511 Pain in right shoulder: Secondary | ICD-10-CM | POA: Diagnosis not present

## 2024-02-12 DIAGNOSIS — R911 Solitary pulmonary nodule: Secondary | ICD-10-CM | POA: Diagnosis not present

## 2024-02-12 DIAGNOSIS — W1839XA Other fall on same level, initial encounter: Secondary | ICD-10-CM | POA: Insufficient documentation

## 2024-02-12 DIAGNOSIS — W19XXXA Unspecified fall, initial encounter: Secondary | ICD-10-CM

## 2024-02-12 DIAGNOSIS — R0781 Pleurodynia: Secondary | ICD-10-CM | POA: Diagnosis present

## 2024-02-12 MED ORDER — TETRACAINE HCL 0.5 % OP SOLN
2.0000 [drp] | Freq: Once | OPHTHALMIC | Status: AC
  Administered 2024-02-12: 2 [drp] via OPHTHALMIC
  Filled 2024-02-12: qty 4

## 2024-02-12 MED ORDER — FLUORESCEIN SODIUM 1 MG OP STRP
1.0000 | ORAL_STRIP | Freq: Once | OPHTHALMIC | Status: AC
  Administered 2024-02-12: 1 via OPHTHALMIC
  Filled 2024-02-12: qty 1

## 2024-02-12 NOTE — ED Triage Notes (Addendum)
 Fell 2 days ago c/o rt shoulder and rib pain  Has something in his  rt eye cannot get it out yesterday pt ambulatory to triage

## 2024-02-12 NOTE — Discharge Instructions (Addendum)
 Your x-ray results are reassuring today, there is no obvious fracture or dislocation of your right shoulder, or fractures of any of your ribs.  You do have evidence of a prior rotator cuff injury to your right shoulder, I will provide you with the contact information for an orthopedic specialist to schedule follow-up in regard to this.  There was a suspected lung nodule noted on your chest x-ray, I recommend that you follow-up with your primary care provider in regard to this as you may benefit from a CT scan of your chest in the future for further evaluation of this.  Return to the emergency department if your symptoms worsen.

## 2024-02-12 NOTE — ED Provider Notes (Signed)
 [place] [facility] Provider Note   CSN: [id number] Arrival date & time: 02/12/24  1221     Patient presents with: [name] [name] Chrystie is a 73 y.o. male.   73 year old male presenting with right eye discomfort and fall yesterday.  Patient reports tripping over his dog yesterday morning and landing primarily on his right shoulder/right side, he complains of pain in his right rib cage and right shoulder that is worse with overhead reach.  He takes oxycodone  for chronic back pain and reports minimal relief of his symptoms.  He also notes that he was mowing the lawn yesterday without goggles on, he said that dust flew up in his face and ever since then his right eye has been irritated, he used artificial tears at home without relief, states it feels like a rock is in my eye.  Denies head injury, loss of consciousness, confusion, vomiting.   Fall       Prior to Admission medications   Medication Sig Start Date End Date Taking? Authorizing Provider  albuterol (VENTOLIN HFA) 108 (90 Base) MCG/ACT inhaler Inhale 2 puffs into the lungs every 4 (four) hours as needed for wheezing or shortness of breath. 05/27/19   [provider]  Alcohol Swabs (B-D SINGLE USE SWABS REGULAR) PADS Use to check blood sugar once a day. Dx E11.91 12/22/14   O'Sullivan, Melissa, NP  allopurinol  (ZYLOPRIM ) 300 MG tablet Take 300 mg by mouth daily. 03/17/16   [provider]  amitriptyline  (ELAVIL ) 25 MG tablet TAKE 2-3 TABLETS BY MOUTH DAILY AT BEDTIME AS DIRECTED. Patient taking differently: Take 25-75 mg by mouth at bedtime as needed for sleep. TAKE 2-3 TABLETS BY MOUTH DAILY AT BEDTIME AS DIRECTED. 03/28/16   Daryl Setter, NP  aspirin  81 MG EC tablet Take 81 mg by mouth daily. 01/22/17   [provider]  atorvastatin  (LIPITOR) 40 MG tablet Take 1 tablet (40 mg total) by mouth daily. 03/28/16   O'Sullivan, Melissa, NP  Blood Glucose  Calibration (OT ULTRA/FASTTK CNTRL SOLN) SOLN See admin instructions. 10/14/20   [provider]  Blood Glucose Monitoring Suppl (ONE TOUCH ULTRA 2) w/Device KIT USE TO CHECK BLOOD SUGAR ONCE A DAY. DX E11.9 03/28/16   O'Sullivan, Melissa, NP  colchicine  0.6 MG tablet IF GOUT FLARE TAKE 2 TABLETS FOLLOWED BY 1 TABLET 1 HOUR LATER. THEN RESUME ONE TABLET DAILY. 04/17/16   Daryl Setter, NP  Continuous Blood Gluc Receiver (FREESTYLE LIBRE 14 DAY READER) DEVI Use to check BS twice a day/ E11.42 04/23/19   [provider]  Ferrous Sulfate  (IRON ) 325 (65 FE) MG TABS Take 1 tablet by mouth daily. 04/24/14   Saguier, [place], PA-C  furosemide  (LASIX ) 20 MG tablet Take 1 tablet (20 mg total) by mouth daily. 04/13/16   Frann Mabel Mt, DO  gabapentin  (NEURONTIN ) 800 MG tablet TAKE ONE TABLET BY MOUTH 3 TIMES DAILY(AM,NOON,EVE) 04/17/16   Daryl Setter, NP  glucose blood test strip ONE TOUCH ULTRA. Use as instructed to check blood sugar once a day.  DX.. E11.9 03/28/16   Daryl Setter, NP  hydrochlorothiazide  (HYDRODIURIL ) 12.5 MG tablet Take 12.5 mg by mouth daily. 03/18/17   [provider]  JANUVIA  50 MG tablet Take 50 mg by mouth daily. 09/05/21   [provider]  Lancet Devices (EASY MINI EJECT LANCING DEVICE) MISC 2 (two) times daily. 01/10/21   [provider]  lidocaine  (LIDODERM ) 5 % Place 1 patch onto the  skin daily. Remove & Discard patch within 12 hours or as directed by MD    [provider]  losartan  (COZAAR ) 25 MG tablet TAKE ONE TABLET BY MOUTH ONCE DAILY. 03/28/16   O'Sullivan, Melissa, NP  metFORMIN  (GLUCOPHAGE -XR) 500 MG 24 hr tablet Take 2 tablets (1,000 mg total) by mouth daily with breakfast. Patient taking differently: Take 500 mg by mouth every evening. 04/13/16   Frann Mabel Mt, DO  metoprolol  succinate (TOPROL -XL) 25 MG 24 hr tablet Take 1 tablet (25 mg total) by mouth daily. 04/13/16   O'Sullivan, Melissa,  NP  Multiple Vitamins-Minerals (MULTIVITAMIN ADULT) CHEW Chew 2-3 each by mouth daily.    [provider]  [facility] DELICA LANCETS 33G MISC Use to check blood sugar once a day.  Dx  E11.9 03/28/16   O'Sullivan, Melissa, NP  Oxycodone  HCl 10 MG TABS Take 10 mg by mouth 3 (three) times daily. 08/23/21   [provider]  pregabalin  (LYRICA ) 300 MG capsule Take 1 capsule (300 mg total) by mouth 2 (two) times daily. 04/13/16   Frann Mabel Mt, DO  QUEtiapine (SEROQUEL) 100 MG tablet Take 100 mg by mouth at bedtime. 03/18/17   [provider]    Allergies: Pork-derived products, Bactrim  [sulfamethoxazole -trimethoprim ], Lisinopril , and Tylenol  [acetaminophen ]    Review of Systems  Updated Vital Signs  Vitals:   02/12/24 1240 02/12/24 1247 02/12/24 1421 02/12/24 1606  BP: (!) 154/100  (!) 148/71 (!) 176/88  Pulse: 71  (!) 59 (!) 54  Resp: 18   18  Temp: 97.9 F (36.6 C)  97.9 F (36.6 C) (!) 97.5 F (36.4 C)  TempSrc: Oral  Oral Oral  SpO2: 91%  96% 97%  Weight:  108.4 kg    Height:  6' 1 (1.854 m)       Physical Exam Vitals and nursing note reviewed.  HENT:     Head: Normocephalic.  Eyes:     General:        Right eye: No foreign body.     Extraocular Movements: Extraocular movements intact.     Pupils: Pupils are equal, round, and reactive to light.     Comments: R eye examined using fluorescein  stain and Wood's light, no retained foreign body visualized, no fluorescein  uptake suggestive of corneal abrasion/ulcer  Cardiovascular:     Rate and Rhythm: Normal rate and regular rhythm.     Heart sounds: Normal heart sounds.  Pulmonary:     Effort: Pulmonary effort is normal.     Breath sounds: Normal breath sounds.  Musculoskeletal:     Cervical back: Normal range of motion.     Comments: Moves all extremities spontaneously without difficulty R shoulder: Full ROM, overhead reach elicits pain. No obvious bony deformity, swelling, effusion. Back:  No spinous process TTP. R parathoracic ribcage TTP, no obvious bruising/deformity  Skin:    General: Skin is warm and dry.  Neurological:     Mental Status: He is alert.     (all labs ordered are listed, but only abnormal results are displayed) Labs Reviewed - No data to display  EKG: None  Radiology: CT Head Wo Contrast Result Date: 02/12/2024 CLINICAL DATA:  Head and neck trauma EXAM: CT HEAD WITHOUT CONTRAST CT CERVICAL SPINE WITHOUT CONTRAST TECHNIQUE: Multidetector CT imaging of the head and cervical spine was performed following the standard protocol without intravenous contrast. Multiplanar CT image reconstructions of the cervical spine were also generated. RADIATION DOSE REDUCTION: This exam was performed according to the  departmental dose-optimization program which includes automated exposure control, adjustment of the mA and/or kV according to patient size and/or use of iterative reconstruction technique. COMPARISON:  CT brain and cervical spine 04/16/2017 FINDINGS: CT HEAD FINDINGS Brain: No acute territorial infarction, hemorrhage or intracranial mass. The ventricles are nonenlarged Vascular: No hyperdense vessels.  Carotid vascular calcification Skull: Normal. Negative for fracture or focal lesion. Sinuses/Orbits: Mild mucosal thickening in the sinuses Other: None CT CERVICAL SPINE FINDINGS Alignment: Straightening of the cervical spine. Trace anterolisthesis C3 on C4. Facet alignment is within normal limits. Skull base and vertebrae: No acute fracture. No primary bone lesion or focal pathologic process. Soft tissues and spinal canal: No prevertebral fluid or swelling. No visible canal hematoma. Disc levels: Multilevel degenerative osteophytes. Ankylosis across the C5 and C6 vertebral bodies and left posterior elements. Multilevel mild diffuse disc space narrowing. Hypertrophic facet degenerative changes at multiple levels with multilevel foraminal narrowing. Upper chest: Apical emphysema  Other: None IMPRESSION: 1. Negative non contrasted CT appearance of the brain. 2. Straightening of the cervical spine with multilevel degenerative changes. No acute osseous abnormality. 3. Emphysema. Emphysema (ICD10-J43.9). Electronically Signed   By: Luke Bun M.D.   On: 02/12/2024 16:20   CT Cervical Spine Wo Contrast Result Date: 02/12/2024 CLINICAL DATA:  Head and neck trauma EXAM: CT HEAD WITHOUT CONTRAST CT CERVICAL SPINE WITHOUT CONTRAST TECHNIQUE: Multidetector CT imaging of the head and cervical spine was performed following the standard protocol without intravenous contrast. Multiplanar CT image reconstructions of the cervical spine were also generated. RADIATION DOSE REDUCTION: This exam was performed according to the departmental dose-optimization program which includes automated exposure control, adjustment of the mA and/or kV according to patient size and/or use of iterative reconstruction technique. COMPARISON:  CT brain and cervical spine 04/16/2017 FINDINGS: CT HEAD FINDINGS Brain: No acute territorial infarction, hemorrhage or intracranial mass. The ventricles are nonenlarged Vascular: No hyperdense vessels.  Carotid vascular calcification Skull: Normal. Negative for fracture or focal lesion. Sinuses/Orbits: Mild mucosal thickening in the sinuses Other: None CT CERVICAL SPINE FINDINGS Alignment: Straightening of the cervical spine. Trace anterolisthesis C3 on C4. Facet alignment is within normal limits. Skull base and vertebrae: No acute fracture. No primary bone lesion or focal pathologic process. Soft tissues and spinal canal: No prevertebral fluid or swelling. No visible canal hematoma. Disc levels: Multilevel degenerative osteophytes. Ankylosis across the C5 and C6 vertebral bodies and left posterior elements. Multilevel mild diffuse disc space narrowing. Hypertrophic facet degenerative changes at multiple levels with multilevel foraminal narrowing. Upper chest: Apical emphysema Other:  None IMPRESSION: 1. Negative non contrasted CT appearance of the brain. 2. Straightening of the cervical spine with multilevel degenerative changes. No acute osseous abnormality. 3. Emphysema. Emphysema (ICD10-J43.9). Electronically Signed   By: Luke Bun M.D.   On: 02/12/2024 16:20   DG Shoulder Right Result Date: 02/12/2024 CLINICAL DATA:  Clemens, shoulder pain EXAM: RIGHT SHOULDER - 2+ VIEW COMPARISON:  None Available. FINDINGS: Frontal, transscapular, and axillary views of the right shoulder are obtained. No acute fracture, subluxation, or dislocation. There is marked narrowing of the acromial humeral interval, compatible with chronic longstanding rotator cuff tear. Mild glenohumeral joint space narrowing. Soft tissues appear unremarkable. Visualized portions of the right chest are clear. IMPRESSION: 1. Evidence of chronic longstanding rotator cuff tear, with narrowing of the acromial humeral interval. 2. No acute fracture. Electronically Signed   By: Ozell Daring M.D.   On: 02/12/2024 15:46   DG Ribs Unilateral W/Chest Right Result Date: 02/12/2024  CLINICAL DATA:  Clemens 2 days ago, right shoulder and anterior rib pain EXAM: RIGHT RIBS AND CHEST - 3+ VIEW COMPARISON:  08/29/2023 FINDINGS: Frontal view of the chest as well as frontal and oblique views of the right thoracic cage are obtained. Cardiac silhouette is unremarkable. No airspace disease, effusion, or pneumothorax. There is a rounded 2.7 cm density overlying the right hilum, which may reflect summation of shadows from hilar vasculature and the costovertebral junction of the seventh rib. Further evaluation with chest CT may be useful to exclude underlying adenopathy or lung nodule. IMPRESSION: 1. No acute intrathoracic trauma. 2. Rounded density projecting over the right hilum, which may reflect summation of shadows with the seventh rib costovertebral junction and hilar vasculature. Further evaluation with chest CT may be useful to exclude  adenopathy or lung nodule. Electronically Signed   By: Ozell Daring M.D.   On: 02/12/2024 15:45     Procedures   Medications Ordered in the ED  tetracaine  (PONTOCAINE) 0.5 % ophthalmic solution 2 drop (2 drops Right Eye Given 02/12/24 1438)  fluorescein  ophthalmic strip 1 strip (1 strip Right Eye Given 02/12/24 1438)    Clinical Course as of 02/12/24 2309  Tue Feb 12, 2024  1528 Trip and fall, right shoulder pain and  [LS]  1642 Seen  [LS]    Clinical Course User Index [LS] Rogelia Jerilynn RAMAN, MD                                 Medical Decision Making This patient presents to the ED for concern of fall, this involves an extensive number of treatment options, and is a complaint that carries with it a high risk of complications and morbidity.  The differential diagnosis includes fracture, dislocation, concussion, contusion, intracranial hemorrhage   Co morbidities that complicate the patient evaluation  Diabetes   Additional history obtained:  Additional history obtained from record review External records from outside source obtained and reviewed including previous PCP note   Imaging Studies ordered:  I ordered imaging studies including XR rib view, XR shoulder  I independently visualized and interpreted imaging which showed  - XR rib view: 1. No acute intrathoracic trauma. 2. Rounded density projecting over the right hilum, which may reflect summation of shadows with the seventh rib costovertebral junction and hilar vasculature. Further evaluation with chest CT may be useful to exclude adenopathy or lung nodule. - XR shoulder: 1. Evidence of chronic longstanding rotator cuff tear, with narrowing of the acromial humeral interval. 2. No acute fracture. - CT head and CT C-spine: 1. Negative non contrasted CT appearance of the brain. 2. Straightening of the cervical spine with multilevel degenerative changes. No acute osseous abnormality. 3. Emphysema. Emphysema (ICD10-J43.9).    I agree with the radiologist interpretation   Cardiac Monitoring: / EKG:  The patient was maintained on a cardiac monitor.  I personally viewed and interpreted the cardiac monitored which showed an underlying rhythm of: NSR   Problem List / ED Course / Critical interventions / Medication management  I have reviewed the patients home medicines and have made adjustments as needed   Social Determinants of Health:  Tobacco use   Test / Admission - Considered:  Physical exam notable as above. Visual acuity screening obtained by nursing staff: Bilateral Near: 20/20 Bilateral Distance: 20/50 R  Near: 20/20 R Distance: 20/40 L Near: 20/20 L Distance: 20/20 There is no fluorescein  uptake on  examination of the patient's eye, no signs of corneal abrasion/corneal ulcer.  I recommend that he continue artificial tears and try gel artificial tears for further relief of his eye irritation.  X-ray imaging is reassuring as above, he does have findings suggestive chronic rotator cuff injury, will provide patient with contact information for orthopedic specialist to schedule follow-up.  Patient is aware of pulmonary nodule that was incidentally noted on chest x-ray today, I advised him to follow-up in regard to this with his primary care provider.  He voiced understanding and is in agreement with this plan.  Return precautions discussed.  He is appropriate for discharge at this time.  Staffed with Dr. Rogelia  Amount and/or Complexity of Data Reviewed Radiology: ordered.  Risk Prescription drug management.        Final diagnoses:  Fall, initial encounter  Lung nodule seen on imaging study    ED Discharge Orders     None          Glendia Rocky LOISE DEVONNA 02/12/24 2310    Rogelia Jerilynn RAMAN, MD 02/15/24 [phone]

## 2024-03-12 ENCOUNTER — Ambulatory Visit: Admitting: Orthopedic Surgery

## 2024-03-21 ENCOUNTER — Ambulatory Visit: Admitting: Podiatry

## 2024-03-21 VITALS — Ht 73.0 in | Wt 239.0 lb

## 2024-03-21 DIAGNOSIS — M79674 Pain in right toe(s): Secondary | ICD-10-CM

## 2024-03-21 DIAGNOSIS — L84 Corns and callosities: Secondary | ICD-10-CM | POA: Diagnosis not present

## 2024-03-21 DIAGNOSIS — M79675 Pain in left toe(s): Secondary | ICD-10-CM

## 2024-03-21 DIAGNOSIS — B351 Tinea unguium: Secondary | ICD-10-CM

## 2024-03-21 DIAGNOSIS — I739 Peripheral vascular disease, unspecified: Secondary | ICD-10-CM | POA: Diagnosis not present

## 2024-03-21 NOTE — Progress Notes (Signed)
 Subjective: Chief Complaint  Patient presents with   Callouses    Rm 13 Patient is here for [facility] and bilateral callus trim.    73 year old male presents the office today for follow-up evaluation of painful skin lesion on the lateral aspect of his right foot.  All this of the nails are thickened discolored causing discomfort.  You had the purchase offloading pads from us  today.  He did get some new shoes which have been helpful.     Objective: AAO x3, NAD DP/PT pulses palpable 1/4 bilaterally, CRT less than 3 seconds Sensation decreased. Hyperkeratotic lesion present right foot fifth metatarsal base.  There is no open lesions.  Tenderness palpation directly along the skin lesion.  There is no fluctuation or crepitation.  Malodor. Nails are hypertrophic, dystrophic, brittle, discolored, elongated 10. No surrounding redness or drainage. Tenderness nails 1-5 bilaterally.  Hammertoes are present. No pain with calf compression, swelling, warmth, erythema  Assessment: Painful hyperkeratotic tissue right foot ; symptomatic onychomycosis  Plan: -All treatment options discussed with the patient including all alternatives, risks, complications.  -Sharp debrided nails x10 without any complications or bleeding  -Sharply debrided the skin lesion x 1. Very minimal pinpoint bleeding occurred during debridement on the central portion.  Bandage applied.  There is some changes. -Daily foot inspection.  No follow-ups on file.  Donnice JONELLE Fees DPM

## 2024-06-23 ENCOUNTER — Ambulatory Visit: Admitting: Podiatry
